# Patient Record
Sex: Female | Born: 1975 | Race: White | Hispanic: No | Marital: Married | Smoking: Former smoker
Health system: Southern US, Community
[De-identification: ages and names within clinical notes are randomized; demographics above are authoritative.]

## PROBLEM LIST (undated history)

## (undated) DIAGNOSIS — J45909 Unspecified asthma, uncomplicated: Secondary | ICD-10-CM

## (undated) DIAGNOSIS — J449 Chronic obstructive pulmonary disease, unspecified: Secondary | ICD-10-CM

## (undated) DIAGNOSIS — E039 Hypothyroidism, unspecified: Secondary | ICD-10-CM

## (undated) DIAGNOSIS — E162 Hypoglycemia, unspecified: Secondary | ICD-10-CM

## (undated) DIAGNOSIS — J189 Pneumonia, unspecified organism: Secondary | ICD-10-CM

## (undated) DIAGNOSIS — F419 Anxiety disorder, unspecified: Secondary | ICD-10-CM

## (undated) DIAGNOSIS — T7840XA Allergy, unspecified, initial encounter: Secondary | ICD-10-CM

## (undated) DIAGNOSIS — R519 Headache, unspecified: Secondary | ICD-10-CM

## (undated) HISTORY — DX: Allergy, unspecified, initial encounter: T78.40XA

## (undated) HISTORY — PX: ABDOMINAL HYSTERECTOMY: SHX81

## (undated) HISTORY — DX: Hypoglycemia, unspecified: E16.2

## (undated) HISTORY — DX: Anxiety disorder, unspecified: F41.9

---

## 2005-07-05 ENCOUNTER — Emergency Department (HOSPITAL_COMMUNITY): Admission: EM | Admit: 2005-07-05 | Discharge: 2005-07-05 | Payer: Self-pay | Admitting: Emergency Medicine

## 2007-03-05 ENCOUNTER — Ambulatory Visit (HOSPITAL_COMMUNITY): Admission: RE | Admit: 2007-03-05 | Discharge: 2007-03-05 | Payer: Self-pay | Admitting: Family Medicine

## 2015-08-01 ENCOUNTER — Emergency Department (HOSPITAL_BASED_OUTPATIENT_CLINIC_OR_DEPARTMENT_OTHER): Payer: 59

## 2015-08-01 ENCOUNTER — Emergency Department (HOSPITAL_BASED_OUTPATIENT_CLINIC_OR_DEPARTMENT_OTHER)
Admission: EM | Admit: 2015-08-01 | Discharge: 2015-08-01 | Disposition: A | Discharge status: Home / Self Care | Payer: 59 | Attending: Emergency Medicine | Admitting: Emergency Medicine

## 2015-08-01 ENCOUNTER — Encounter (HOSPITAL_BASED_OUTPATIENT_CLINIC_OR_DEPARTMENT_OTHER): Payer: Self-pay | Admitting: *Deleted

## 2015-08-01 DIAGNOSIS — F17210 Nicotine dependence, cigarettes, uncomplicated: Secondary | ICD-10-CM | POA: Diagnosis not present

## 2015-08-01 DIAGNOSIS — E119 Type 2 diabetes mellitus without complications: Secondary | ICD-10-CM

## 2015-08-01 DIAGNOSIS — R000 Tachycardia, unspecified: Secondary | ICD-10-CM | POA: Diagnosis not present

## 2015-08-01 DIAGNOSIS — R079 Chest pain, unspecified: Secondary | ICD-10-CM | POA: Diagnosis present

## 2015-08-01 LAB — CBG MONITORING, ED: Glucose-Capillary: 199 mg/dL — ABNORMAL HIGH (ref 65–99)

## 2015-08-01 LAB — BASIC METABOLIC PANEL
ANION GAP: 11 (ref 5–15)
BUN: 14 mg/dL (ref 6–20)
CALCIUM: 8.2 mg/dL — AB (ref 8.9–10.3)
CHLORIDE: 102 mmol/L (ref 101–111)
CO2: 19 mmol/L — AB (ref 22–32)
Creatinine, Ser: 0.61 mg/dL (ref 0.44–1.00)
GFR calc Af Amer: >60 mL/min (ref >60)
GFR calc non Af Amer: >60 mL/min (ref >60)
GLUCOSE: 294 mg/dL — AB (ref 65–99)
Potassium: 3.8 mmol/L (ref 3.5–5.1)
Sodium: 132 mmol/L — ABNORMAL LOW (ref 135–145)

## 2015-08-01 LAB — CBC
HCT: 47 % — ABNORMAL HIGH (ref 36.0–46.0)
Hemoglobin: 16.1 g/dL — ABNORMAL HIGH (ref 12.0–15.0)
MCH: 33.5 pg (ref 26.0–34.0)
MCHC: 34.3 g/dL (ref 30.0–36.0)
MCV: 97.7 fL (ref 78.0–100.0)
PLATELETS: 237 10*3/uL (ref 150–400)
RBC: 4.81 MIL/uL (ref 3.87–5.11)
RDW: 13 % (ref 11.5–15.5)
WBC: 13.8 10*3/uL — ABNORMAL HIGH (ref 4.0–10.5)

## 2015-08-01 LAB — D-DIMER, QUANTITATIVE (NOT AT ARMC): D DIMER QUANT: 0.34 ug{FEU}/mL (ref 0.00–0.50)

## 2015-08-01 LAB — TROPONIN I

## 2015-08-01 MED ORDER — METFORMIN HCL 500 MG PO TABS: 500.0000 mg | ORAL_TABLET | Freq: Two times a day (BID) | ORAL | Status: DC

## 2015-08-01 MED ORDER — KETOROLAC TROMETHAMINE 30 MG/ML IJ SOLN
30.0000 mg | Freq: Once | INTRAMUSCULAR | Status: AC
  Administered 2015-08-01: 30 mg via INTRAVENOUS
  Filled 2015-08-01: qty 1

## 2015-08-01 MED ORDER — SODIUM CHLORIDE 0.9 % IV BOLUS (SEPSIS)
1000.0000 mL | Freq: Once | INTRAVENOUS | Status: AC
  Administered 2015-08-01: 1000 mL via INTRAVENOUS

## 2015-08-01 NOTE — ED Notes (Signed)
Pt placed on cont cardiac monitoring, with cont POX and int NBP q56min

## 2015-08-01 NOTE — ED Provider Notes (Signed)
CSN: [id number]     Arrival date & time 08/01/15  0935 History   First MD Initiated Contact with Patient 08/01/15 [phone]     Chief Complaint  Patient presents with  . Chest Pain     (Consider location/radiation/quality/duration/timing/severity/associated sxs/prior Treatment) HPI Comments: 40 year old female chronic every day smoker with no other past medical history presents for shortness of breath and chest pain. The patient reports that over the last few days she has felt lightheaded and felt her heart racing. She is also felt shaky. She says that she has one spot in her left chest which has had sharp pain in it. She denies any cough, fevers, chills. She said she went to see her sister-in-law who is a nurse who told her her blood pressure was elevated and her heart rate and that she needed to go directly to an emergency room. Patient is not on any oral contraceptives. She has had a hysterectomy in the past. No long trips. No leg swelling.  Patient is a 40 y.o. female presenting with chest pain.  Chest Pain Associated symptoms: nausea, palpitations and shortness of breath   Associated symptoms: no abdominal pain, no back pain, no cough, no dizziness, no fatigue, no fever, no headache, not vomiting and no weakness     History reviewed. No pertinent past medical history. Past Surgical History  Procedure Laterality Date  . Abdominal hysterectomy     No family history on file. Social History  Substance Use Topics  . Smoking status: Current Every Day Smoker -- 0.50 packs/day    Types: Cigarettes  . Smokeless tobacco: None  . Alcohol Use: Yes   OB History    No data available     Review of Systems  Constitutional: Negative for fever, chills, appetite change and fatigue.  HENT: Negative for congestion, nosebleeds and postnasal drip.   Eyes: Negative for visual disturbance.  Respiratory: Positive for shortness of breath. Negative for cough and chest tightness.   Cardiovascular: Positive  for chest pain and palpitations.  Gastrointestinal: Positive for nausea. Negative for vomiting, abdominal pain, diarrhea and constipation.  Genitourinary: Negative for dysuria, urgency and hematuria.  Musculoskeletal: Negative for myalgias and back pain.  Skin: Negative for rash.  Neurological: Positive for light-headedness. Negative for dizziness, weakness and headaches.  Hematological: Does not bruise/bleed easily.      Allergies  Review of patient's allergies indicates no known allergies.  Home Medications   Prior to Admission medications   Medication Sig Start Date End Date Taking? Authorizing Provider  metFORMIN (GLUCOPHAGE) 500 MG tablet Take 1 tablet (500 mg total) by mouth 2 (two) times daily with a meal. 08/01/15   Harvel Quale, MD   BP 130/75 mmHg  Pulse 90  Resp 20  SpO2 98% Physical Exam  Constitutional: She is oriented to person, place, and time. She appears well-developed and well-nourished. No distress.  HENT:  Head: Normocephalic and atraumatic.  Right Ear: External ear normal.  Left Ear: External ear normal.  Nose: Nose normal.  Mouth/Throat: Oropharynx is clear and moist. No oropharyngeal exudate.  Eyes: EOM are normal. Pupils are equal, round, and reactive to light.  Neck: Normal range of motion. Neck supple.  Cardiovascular: Regular rhythm, normal heart sounds and intact distal pulses.  Tachycardia present.   No murmur heard. Pulmonary/Chest: Effort normal. No respiratory distress. She has no wheezes. She has no rales.  Abdominal: Soft. She exhibits no distension. There is no tenderness.  Musculoskeletal: Normal range of motion. She exhibits  no edema or tenderness.  Neurological: She is alert and oriented to person, place, and time.  Skin: Skin is warm and dry. No rash noted. She is not diaphoretic.  Vitals reviewed.   ED Course  Procedures (including critical care time) Labs Review Labs Reviewed  BASIC METABOLIC PANEL - Abnormal; Notable for the  following:    Sodium 132 (*)    CO2 19 (*)    Glucose, Bld 294 (*)    Calcium 8.2 (*)    All other components within normal limits  CBC - Abnormal; Notable for the following:    WBC 13.8 (*)    Hemoglobin 16.1 (*)    HCT 47.0 (*)    All other components within normal limits  CBG MONITORING, ED - Abnormal; Notable for the following:    Glucose-Capillary 199 (*)    All other components within normal limits  TROPONIN I  D-DIMER, QUANTITATIVE (NOT AT [facility])    Imaging Review Dg Chest 2 View  08/01/2015  CLINICAL DATA:  Chest pain and tachycardia EXAM: CHEST  2 VIEW COMPARISON:  March 05, 2007 FINDINGS: There is no edema or consolidation. The heart size and pulmonary vascularity are normal. No adenopathy. No apparent bone lesions. No pneumothorax. IMPRESSION: No edema or consolidation. Electronically Signed   By: Lowella Grip III M.D.   On: 08/01/2015 10:16   I have personally reviewed and evaluated these images and lab results as part of my medical decision-making.   EKG Interpretation   Date/Time:  Sunday Aug 01 2015 09:45:24 EDT Ventricular Rate:  107 PR Interval:  116 QRS Duration: 89 QT Interval:  343 QTC Calculation: 458 R Axis:   81 Text Interpretation:  Sinus tachycardia Atrial premature complex Right  atrial enlargement No previous ECGs available Confirmed by Donnetta Gillin  [id number]) on 08/01/2015 9:52:05 AM      MDM  Patient was seen and evaluated in stable condition. Laboratory results revealed a mildly low bicarbonate as well as hyperglycemia to 94. Normal anion gap. Patient without history of diabetes although strong family history of diabetes. Patient did feel improved after 1 L fluid bolus. All results and clinical impression were discussed at length with patient and her husband. They felt comfortable with plan for discharge and outpatient follow-up. The patient expresses the importance of establishing with a primary care physician for recheck of her chest pain as  well as her new diabetes. She was given a prescription for metformin. Strict return precautions were given. Final diagnoses:  Diabetes mellitus, new onset ([place])  Chest pain, unspecified chest pain type    1. New-onset diabetes 2. Chest pain    Harvel Quale, MD 08/01/15 1308

## 2015-08-01 NOTE — ED Notes (Signed)
MD at bedside. 

## 2015-08-01 NOTE — Discharge Instructions (Signed)
You were seen and evaluated today for your chest pain. It was noted that your blood sugar is very high. This came down with fluids. You need to stop eating and drinking things that are high in sugar.  Take the medication prescribed to help control your blood sugar. You need to follow-up with the primary care physician for reevaluation of your blood sugar as well as for reevaluation of your chest pain. The exact cause of your chest pain is not completely clear but your results today were reassuring. Return immediately for sudden worsening of symptoms.  Type 2 Diabetes Mellitus, Adult Type 2 diabetes mellitus, often simply referred to as type 2 diabetes, is a long-lasting (chronic) disease. In type 2 diabetes, the pancreas does not make enough insulin (a hormone), the cells are less responsive to the insulin that is made (insulin resistance), or both. Normally, insulin moves sugars from food into the tissue cells. The tissue cells use the sugars for energy. The lack of insulin or the lack of normal response to insulin causes excess sugars to build up in the blood instead of going into the tissue cells. As a result, high blood sugar (hyperglycemia) develops. The effect of high sugar (glucose) levels can cause many complications. Type 2 diabetes was also previously called adult-onset diabetes, but it can occur at any age.  RISK FACTORS  A person is predisposed to developing type 2 diabetes if someone in the family has the disease and also has one or more of the following primary risk factors:  Weight gain, or being overweight or obese.  An inactive lifestyle.  A history of consistently eating high-calorie foods. Maintaining a normal weight and regular physical activity can reduce the chance of developing type 2 diabetes. SYMPTOMS  A person with type 2 diabetes may not show symptoms initially. The symptoms of type 2 diabetes appear slowly. The symptoms include:  Increased thirst  (polydipsia).  Increased urination (polyuria).  Increased urination during the night (nocturia).  Sudden or unexplained weight changes.  Frequent, recurring infections.  Tiredness (fatigue).  Weakness.  Vision changes, such as blurred vision.  Fruity smell to your breath.  Abdominal pain.  Nausea or vomiting.  Cuts or bruises which are slow to heal.  Tingling or numbness in the hands or feet.  An open skin wound (ulcer). DIAGNOSIS Type 2 diabetes is frequently not diagnosed until complications of diabetes are present. Type 2 diabetes is diagnosed when symptoms or complications are present and when blood glucose levels are increased. Your blood glucose level may be checked by one or more of the following blood tests:  A fasting blood glucose test. You will not be allowed to eat for at least 8 hours before a blood sample is taken.  A random blood glucose test. Your blood glucose is checked at any time of the day regardless of when you ate.  A hemoglobin A1c blood glucose test. A hemoglobin A1c test provides information about blood glucose control over the previous 3 months.  An oral glucose tolerance test (OGTT). Your blood glucose is measured after you have not eaten (fasted) for 2 hours and then after you drink a glucose-containing beverage. TREATMENT   You may need to take insulin or diabetes medicine daily to keep blood glucose levels in the desired range.  If you use insulin, you may need to adjust the dosage depending on the carbohydrates that you eat with each meal or snack.  Lifestyle changes are recommended as part of your treatment. These  may include:  Following an individualized diet plan developed by a nutritionist or dietitian.  Exercising daily. Your health care providers will set individualized treatment goals for you based on your age, your medicines, how long you have had diabetes, and any other medical conditions you have. Generally, the goal of  treatment is to maintain the following blood glucose levels:  Before meals (preprandial): 80-130 mg/dL.  After meals (postprandial): below 180 mg/dL.  A1c: less than 6.5-7%. HOME CARE INSTRUCTIONS   Have your hemoglobin A1c level checked twice a year.  Perform daily blood glucose monitoring as directed by your health care provider.  Monitor urine ketones when you are ill and as directed by your health care provider.  Take your diabetes medicine or insulin as directed by your health care provider to maintain your blood glucose levels in the desired range.  Never run out of diabetes medicine or insulin. It is needed every day.  If you are using insulin, you may need to adjust the amount of insulin given based on your intake of carbohydrates. Carbohydrates can raise blood glucose levels but need to be included in your diet. Carbohydrates provide vitamins, minerals, and fiber which are an essential part of a healthy diet. Carbohydrates are found in fruits, vegetables, whole grains, dairy products, legumes, and foods containing added sugars.  Eat healthy foods. You should make an appointment to see a registered dietitian to help you create an eating plan that is right for you.  Lose weight if you are overweight.  Carry a medical alert card or wear your medical alert jewelry.  Carry a 15-gram carbohydrate snack with you at all times to treat low blood glucose (hypoglycemia). Some examples of 15-gram carbohydrate snacks include:  Glucose tablets, 3 or 4.  Glucose gel, 15-gram tube.  Raisins, 2 tablespoons (24 grams).  Jelly beans, 6.  Animal crackers, 8.  Regular pop, 4 ounces (120 mL).  Gummy treats, 9.  Recognize hypoglycemia. Hypoglycemia occurs with blood glucose levels of 70 mg/dL and below. The risk for hypoglycemia increases when fasting or skipping meals, during or after intense exercise, and during sleep. Hypoglycemia symptoms can include:  Tremors or  shakes.  Decreased ability to concentrate.  Sweating.  Increased heart rate.  Headache.  Dry mouth.  Hunger.  Irritability.  Anxiety.  Restless sleep.  Altered speech or coordination.  Confusion.  Treat hypoglycemia promptly. If you are alert and able to safely swallow, follow the 15:15 rule:  Take 15-20 grams of rapid-acting glucose or carbohydrate. Rapid-acting options include glucose gel, glucose tablets, or 4 ounces (120 mL) of fruit juice, regular soda, or low-fat milk.  Check your blood glucose level 15 minutes after taking the glucose.  Take 15-20 grams more of glucose if the repeat blood glucose level is still 70 mg/dL or below.  Eat a meal or snack within 1 hour once blood glucose levels return to normal.  Be alert to feeling very thirsty and urinating more frequently than usual, which are early signs of hyperglycemia. An early awareness of hyperglycemia allows for prompt treatment. Treat hyperglycemia as directed by your health care provider.  Engage in at least 150 minutes of moderate-intensity physical activity a week, spread over at least 3 days of the week or as directed by your health care provider. In addition, you should engage in resistance exercise at least 2 times a week or as directed by your health care provider. Try to spend no more than 90 minutes at one time inactive.  Adjust your medicine and food intake as needed if you start a new exercise or sport.  Follow your sick-day plan anytime you are unable to eat or drink as usual.  Do not use any tobacco products including cigarettes, chewing tobacco, or electronic cigarettes. If you need help quitting, ask your health care provider.  Limit alcohol intake to no more than 1 drink per day for nonpregnant women and 2 drinks per day for men. You should drink alcohol only when you are also eating food. Talk with your health care provider whether alcohol is safe for you. Tell your health care provider if you  drink alcohol several times a week.  Keep all follow-up visits as directed by your health care provider. This is important.  Schedule an eye exam soon after the diagnosis of type 2 diabetes and then annually.  Perform daily skin and foot care. Examine your skin and feet daily for cuts, bruises, redness, nail problems, bleeding, blisters, or sores. A foot exam by a health care provider should be done annually.  Brush your teeth and gums at least twice a day and floss at least once a day. Follow up with your dentist regularly.  Share your diabetes management plan with your workplace or school.  Keep your immunizations up to date. It is recommended that you receive a flu (influenza) vaccine every year. It is also recommended that you receive a pneumonia (pneumococcal) vaccine. If you are 45 years of age or older and have never received a pneumonia vaccine, this vaccine may be given as a series of two separate shots. Ask your health care provider which additional vaccines may be recommended.  Learn to manage stress.  Obtain ongoing diabetes education and support as needed.  Participate in or seek rehabilitation as needed to maintain or improve independence and quality of life. Request a physical or occupational therapy referral if you are having foot or hand numbness, or difficulties with grooming, dressing, eating, or physical activity. SEEK MEDICAL CARE IF:   You are unable to eat food or drink fluids for more than 6 hours.  You have nausea and vomiting for more than 6 hours.  Your blood glucose level is over 240 mg/dL.  There is a change in mental status.  You develop an additional serious illness.  You have diarrhea for more than 6 hours.  You have been sick or have had a fever for a couple of days and are not getting better.  You have pain during any physical activity.  SEEK IMMEDIATE MEDICAL CARE IF:  You have difficulty breathing.  You have moderate to large ketone  levels.   This information is not intended to replace advice given to you by your health care provider. Make sure you discuss any questions you have with your health care provider.   Document Released: 03/13/2005 Document Revised: 12/02/2014 Document Reviewed: 10/10/2011 Elsevier Interactive Patient Education 2016 [employer]  Nonspecific Chest Pain  Chest pain can be caused by many different conditions. There is always a chance that your pain could be related to something serious, such as a heart attack or a blood clot in your lungs. Chest pain can also be caused by conditions that are not life-threatening. If you have chest pain, it is very important to follow up with your health care provider. CAUSES  Chest pain can be caused by:  Heartburn.  Pneumonia or bronchitis.  Anxiety or stress.  Inflammation around your heart (pericarditis) or lung (pleuritis or pleurisy).  A  blood clot in your lung.  A collapsed lung (pneumothorax). It can develop suddenly on its own (spontaneous pneumothorax) or from trauma to the chest.  Shingles infection (varicella-zoster virus).  Heart attack.  Damage to the bones, muscles, and cartilage that make up your chest wall. This can include:  Bruised bones due to injury.  Strained muscles or cartilage due to frequent or repeated coughing or overwork.  Fracture to one or more ribs.  Sore cartilage due to inflammation (costochondritis). RISK FACTORS  Risk factors for chest pain may include:  Activities that increase your risk for trauma or injury to your chest.  Respiratory infections or conditions that cause frequent coughing.  Medical conditions or overeating that can cause heartburn.  Heart disease or family history of heart disease.  Conditions or health behaviors that increase your risk of developing a blood clot.  Having had chicken pox (varicella zoster). SIGNS AND SYMPTOMS Chest pain can feel like:  Burning or tingling on the  surface of your chest or deep in your chest.  Crushing, pressure, aching, or squeezing pain.  Dull or sharp pain that is worse when you move, cough, or take a deep breath.  Pain that is also felt in your back, neck, shoulder, or arm, or pain that spreads to any of these areas. Your chest pain may come and go, or it may stay constant. DIAGNOSIS Lab tests or other studies may be needed to find the cause of your pain. Your health care provider may have you take a test called an ambulatory ECG (electrocardiogram). An ECG records your heartbeat patterns at the time the test is performed. You may also have other tests, such as:  Transthoracic echocardiogram (TTE). During echocardiography, sound waves are used to create a picture of all of the heart structures and to look at how blood flows through your heart.  Transesophageal echocardiogram (TEE).This is a more advanced imaging test that obtains images from inside your body. It allows your health care provider to see your heart in finer detail.  Cardiac monitoring. This allows your health care provider to monitor your heart rate and rhythm in real time.  Holter monitor. This is a portable device that records your heartbeat and can help to diagnose abnormal heartbeats. It allows your health care provider to track your heart activity for several days, if needed.  Stress tests. These can be done through exercise or by taking medicine that makes your heart beat more quickly.  Blood tests.  Imaging tests. TREATMENT  Your treatment depends on what is causing your chest pain. Treatment may include:  Medicines. These may include:  Acid blockers for heartburn.  Anti-inflammatory medicine.  Pain medicine for inflammatory conditions.  Antibiotic medicine, if an infection is present.  Medicines to dissolve blood clots.  Medicines to treat coronary artery disease.  Supportive care for conditions that do not require medicines. This may  include:  Resting.  Applying heat or cold packs to injured areas.  Limiting activities until pain decreases. HOME CARE INSTRUCTIONS  If you were prescribed an antibiotic medicine, finish it all even if you start to feel better.  Avoid any activities that bring on chest pain.  Do not use any tobacco products, including cigarettes, chewing tobacco, or electronic cigarettes. If you need help quitting, ask your health care provider.  Do not drink alcohol.  Take medicines only as directed by your health care provider.  Keep all follow-up visits as directed by your health care provider. This is important. This  includes any further testing if your chest pain does not go away.  If heartburn is the cause for your chest pain, you may be told to keep your head raised (elevated) while sleeping. This reduces the chance that acid will go from your stomach into your esophagus.  Make lifestyle changes as directed by your health care provider. These may include:  Getting regular exercise. Ask your health care provider to suggest some activities that are safe for you.  Eating a heart-healthy diet. A registered dietitian can help you to learn healthy eating options.  Maintaining a healthy weight.  Managing diabetes, if necessary.  Reducing stress. SEEK MEDICAL CARE IF:  Your chest pain does not go away after treatment.  You have a rash with blisters on your chest.  You have a fever. SEEK IMMEDIATE MEDICAL CARE IF:   Your chest pain is worse.  You have an increasing cough, or you cough up blood.  You have severe abdominal pain.  You have severe weakness.  You faint.  You have chills.  You have sudden, unexplained chest discomfort.  You have sudden, unexplained discomfort in your arms, back, neck, or jaw.  You have shortness of breath at any time.  You suddenly start to sweat, or your skin gets clammy.  You feel nauseous or you vomit.  You suddenly feel light-headed or  dizzy.  Your heart begins to beat quickly, or it feels like it is skipping beats. These symptoms may represent a serious problem that is an emergency. Do not wait to see if the symptoms will go away. Get medical help right away. Call your local emergency services (911 in the [place] Do not drive yourself to the hospital.   This information is not intended to replace advice given to you by your health care provider. Make sure you discuss any questions you have with your health care provider.   Document Released: 12/21/2004 Document Revised: 04/03/2014 Document Reviewed: 10/17/2013 Elsevier Interactive Patient Education 2016 [employer].  Diabetes Mellitus and Food It is important for you to manage your blood sugar (glucose) level. Your blood glucose level can be greatly affected by what you eat. Eating healthier foods in the appropriate amounts throughout the day at about the same time each day will help you control your blood glucose level. It can also help slow or prevent worsening of your diabetes mellitus. Healthy eating may even help you improve the level of your blood pressure and reach or maintain a healthy weight.  General recommendations for healthful eating and cooking habits include:  Eating meals and snacks regularly. Avoid going long periods of time without eating to lose weight.  Eating a diet that consists mainly of plant-based foods, such as fruits, vegetables, nuts, legumes, and whole grains.  Using low-heat cooking methods, such as baking, instead of high-heat cooking methods, such as deep frying. Work with your dietitian to make sure you understand how to use the Nutrition Facts information on food labels. HOW CAN FOOD AFFECT ME? Carbohydrates Carbohydrates affect your blood glucose level more than any other type of food. Your dietitian will help you determine how many carbohydrates to eat at each meal and teach you how to count carbohydrates. Counting carbohydrates is  important to keep your blood glucose at a healthy level, especially if you are using insulin or taking certain medicines for diabetes mellitus. Alcohol Alcohol can cause sudden decreases in blood glucose (hypoglycemia), especially if you use insulin or take certain medicines for diabetes mellitus. Hypoglycemia can  be a life-threatening condition. Symptoms of hypoglycemia (sleepiness, dizziness, and disorientation) are similar to symptoms of having too much alcohol.  If your health care provider has given you approval to drink alcohol, do so in moderation and use the following guidelines:  Women should not have more than one drink per day, and men should not have more than two drinks per day. One drink is equal to:  12 oz of beer.  5 oz of wine.  1 oz of hard liquor.  Do not drink on an empty stomach.  Keep yourself hydrated. Have water, diet soda, or unsweetened iced tea.  Regular soda, juice, and other mixers might contain a lot of carbohydrates and should be counted. WHAT FOODS ARE NOT RECOMMENDED? As you make food choices, it is important to remember that all foods are not the same. Some foods have fewer nutrients per serving than other foods, even though they might have the same number of calories or carbohydrates. It is difficult to get your body what it needs when you eat foods with fewer nutrients. Examples of foods that you should avoid that are high in calories and carbohydrates but low in nutrients include:  Trans fats (most processed foods list trans fats on the Nutrition Facts label).  Regular soda.  Juice.  Candy.  Sweets, such as cake, pie, doughnuts, and cookies.  Fried foods. WHAT FOODS CAN I EAT? Eat nutrient-rich foods, which will nourish your body and keep you healthy. The food you should eat also will depend on several factors, including:  The calories you need.  The medicines you take.  Your weight.  Your blood glucose level.  Your blood pressure  level.  Your cholesterol level. You should eat a variety of foods, including:  Protein.  Lean cuts of meat.  Proteins low in saturated fats, such as fish, egg whites, and beans. Avoid processed meats.  Fruits and vegetables.  Fruits and vegetables that may help control blood glucose levels, such as apples, mangoes, and yams.  Dairy products.  Choose fat-free or low-fat dairy products, such as milk, yogurt, and cheese.  Grains, bread, pasta, and rice.  Choose whole grain products, such as multigrain bread, whole oats, and brown rice. These foods may help control blood pressure.  Fats.  Foods containing healthful fats, such as nuts, avocado, olive oil, canola oil, and fish. DOES EVERYONE WITH DIABETES MELLITUS HAVE THE SAME MEAL PLAN? Because every person with diabetes mellitus is different, there is not one meal plan that works for everyone. It is very important that you meet with a dietitian who will help you create a meal plan that is just right for you.   This information is not intended to replace advice given to you by your health care provider. Make sure you discuss any questions you have with your health care provider.   Document Released: 12/08/2004 Document Revised: 04/03/2014 Document Reviewed: 02/07/2013 Elsevier Interactive Patient Education [employer].

## 2015-08-01 NOTE — ED Notes (Signed)
Denies any SOB, N/V with chest pain

## 2015-08-01 NOTE — ED Notes (Signed)
Presents with chest pain, onset Friday PM, describes as sharp pain at left ant chest

## 2015-08-04 ENCOUNTER — Telehealth: Payer: Self-pay | Admitting: *Deleted

## 2015-08-04 NOTE — Telephone Encounter (Signed)
Unable to reach patient at time of pre-visit call. Left message for patient to return call when available. Mobile # on file is a wrong number.

## 2015-08-05 ENCOUNTER — Encounter: Payer: Self-pay | Admitting: Medical

## 2015-08-05 ENCOUNTER — Ambulatory Visit (INDEPENDENT_AMBULATORY_CARE_PROVIDER_SITE_OTHER): Payer: 59 | Admitting: Medical

## 2015-08-05 VITALS — BP 116/76 | HR 83 | Temp 98.0°F | Ht 63.0 in | Wt 128.4 lb

## 2015-08-05 DIAGNOSIS — J309 Allergic rhinitis, unspecified: Secondary | ICD-10-CM | POA: Diagnosis not present

## 2015-08-05 DIAGNOSIS — D72829 Elevated white blood cell count, unspecified: Secondary | ICD-10-CM | POA: Diagnosis not present

## 2015-08-05 DIAGNOSIS — F411 Generalized anxiety disorder: Secondary | ICD-10-CM | POA: Diagnosis not present

## 2015-08-05 DIAGNOSIS — E1310 Other specified diabetes mellitus with ketoacidosis without coma: Secondary | ICD-10-CM | POA: Diagnosis not present

## 2015-08-05 DIAGNOSIS — E1110 Type 2 diabetes mellitus with ketoacidosis without coma: Secondary | ICD-10-CM

## 2015-08-05 LAB — COMPREHENSIVE METABOLIC PANEL
ALT: 24 U/L (ref 0–35)
AST: 29 U/L (ref 0–37)
Albumin: 4.2 g/dL (ref 3.5–5.2)
Alkaline Phosphatase: 50 U/L (ref 39–117)
BUN: 5 mg/dL — ABNORMAL LOW (ref 6–23)
CALCIUM: 9.1 mg/dL (ref 8.4–10.5)
CHLORIDE: 102 meq/L (ref 96–112)
CO2: 27 meq/L (ref 19–32)
Creatinine, Ser: 0.51 mg/dL (ref 0.40–1.20)
GFR: 142.72 mL/min (ref >60.00)
Glucose, Bld: 87 mg/dL (ref 70–99)
Potassium: 4.7 mEq/L (ref 3.5–5.1)
Sodium: 138 mEq/L (ref 135–145)
Total Bilirubin: 0.5 mg/dL (ref 0.2–1.2)
Total Protein: 6.6 g/dL (ref 6.0–8.3)

## 2015-08-05 LAB — LIPID PANEL
CHOL/HDL RATIO: 3
Cholesterol: 193 mg/dL (ref 0–200)
HDL: 56.8 mg/dL (ref >39.00)
LDL CALC: 124 mg/dL — AB (ref 0–99)
NonHDL: 136.45
TRIGLYCERIDES: 63 mg/dL (ref 0.0–149.0)
VLDL: 12.6 mg/dL (ref 0.0–40.0)

## 2015-08-05 LAB — CBC WITH DIFFERENTIAL/PLATELET
BASOS PCT: 0.4 % (ref 0.0–3.0)
Basophils Absolute: 0 10*3/uL (ref 0.0–0.1)
EOS ABS: 0.2 10*3/uL (ref 0.0–0.7)
EOS PCT: 1.4 % (ref 0.0–5.0)
HEMATOCRIT: 46.1 % — AB (ref 36.0–46.0)
HEMOGLOBIN: 15.7 g/dL — AB (ref 12.0–15.0)
LYMPHS PCT: 22.7 % (ref 12.0–46.0)
Lymphs Abs: 2.5 10*3/uL (ref 0.7–4.0)
MCHC: 34 g/dL (ref 30.0–36.0)
MCV: 97.4 fl (ref 78.0–100.0)
Monocytes Absolute: 0.9 10*3/uL (ref 0.1–1.0)
Monocytes Relative: 8.6 % (ref 3.0–12.0)
Neutro Abs: 7.3 10*3/uL (ref 1.4–7.7)
Neutrophils Relative %: 66.9 % (ref 43.0–77.0)
Platelets: 224 10*3/uL (ref 150.0–400.0)
RBC: 4.74 Mil/uL (ref 3.87–5.11)
RDW: 12.7 % (ref 11.5–15.5)
WBC: 10.9 10*3/uL — AB (ref 4.0–10.5)

## 2015-08-05 LAB — HEMOGLOBIN A1C: Hgb A1c MFr Bld: 5.4 % (ref 4.6–6.5)

## 2015-08-05 MED ORDER — METFORMIN HCL 500 MG PO TABS: 500.0000 mg | ORAL_TABLET | Freq: Every day | ORAL | Status: DC

## 2015-08-05 MED ORDER — SERTRALINE HCL 25 MG PO TABS: 25.0000 mg | ORAL_TABLET | Freq: Every day | ORAL | Status: DC

## 2015-08-05 NOTE — Addendum Note (Signed)
Addended by: Tasia Catchings on: 08/05/2015 04:10 PM   Modules accepted: Orders, Medications

## 2015-08-05 NOTE — Progress Notes (Signed)
Subjective:    Patient ID: [name], female    DOB: November 20, 1975, 40 y.o.   MRN: [id number]  HPI   I have reviewed pt PMH, PSH, FH, Social History and Surgical History.  Pt works as Fish farm manager [employer], Pt walks a lot at work, no caffeinated beverage, pt diet improved over last year, smoke 1/2 pack a day, married- 3 children  Pt states just recently she was diagnosed with diabetes. She went ED just recently. She felt shaky all weekend and fatigued. Pt went to ED and had work up. Her bs 294. Pt states other symptoms at time of ED evaluation no longer present. See ED ros. Pt other labs such as troponin and D-dimer were negative.   Pt taking metformin. She has some loose stools. So she cut back to one tablet a day.  On review no infection symptoms.    Pt has allergies in spring in fall. Mild and states when takes med makes her sleepy.  Anxiety- mild low level daily. Seemed to come on with various family members that passed last 2-3 years. No depression. Interrupted sleep. Wakes about every 2 hours. Pt does not think she snores.    Review of Systems  Constitutional: Negative for fever, chills, diaphoresis, activity change and fatigue.  Respiratory: Negative for cough, chest tightness and shortness of breath.   Cardiovascular: Negative for chest pain, palpitations and leg swelling.  Gastrointestinal: Negative for nausea, vomiting and abdominal pain.  Endocrine: Positive for polydipsia. Negative for polyphagia and polyuria.  Genitourinary: Negative for dysuria and frequency.  Musculoskeletal: Negative for back pain, neck pain and neck stiffness.  Skin: Negative for rash.  Neurological: Negative for dizziness, weakness and headaches.  Psychiatric/Behavioral: Positive for sleep disturbance. Negative for suicidal ideas, behavioral problems, confusion and agitation. The patient is nervous/anxious.     History reviewed. No pertinent past medical history.   Social History    Social History  . Marital Status: Single    Spouse Name: N/A  . Number of Children: N/A  . Years of Education: N/A   Occupational History  . Not on file.   Social History Main Topics  . Smoking status: Current Every Day Smoker -- 0.50 packs/day    Types: Cigarettes  . Smokeless tobacco: Not on file  . Alcohol Use: Yes  . Drug Use: No  . Sexual Activity: Not on file   Other Topics Concern  . Not on file   Social History Narrative    Past Surgical History  Procedure Laterality Date  . Abdominal hysterectomy      History reviewed. No pertinent family history.  No Known Allergies  Current Outpatient Prescriptions on File Prior to Visit  Medication Sig Dispense Refill  . metFORMIN (GLUCOPHAGE) 500 MG tablet Take 1 tablet (500 mg total) by mouth 2 (two) times daily with a meal. 14 tablet 0   No current facility-administered medications on file prior to visit.    BP 116/76 mmHg  Pulse 83  Temp(Src) 98 F (36.7 C) (Oral)  Ht 5\' 3"  (1.6 m)  Wt 128 lb 6.4 oz (58.242 kg)  BMI 22.75 kg/m2  SpO2 99%       Objective:   Physical Exam  General Mental Status- Alert. General Appearance- Not in acute distress.   Skin General: Color- Normal Color. Moisture- Normal Moisture.  Neck Carotid Arteries- Normal color. Moisture- Normal Moisture. No carotid bruits. No JVD.  Chest and Lung Exam Auscultation: Breath Sounds:-Normal.  Cardiovascular Auscultation:Rythm- Regular. Murmurs &  Other Heart Sounds:Auscultation of the heart reveals- No Murmurs.  Abdomen Inspection:-Inspeection Normal. Palpation/Percussion:Note:No mass. Palpation and Percussion of the abdomen reveal- Non Tender, Non Distended + BS, no rebound or guarding.  Neurologic Cranial Nerve exam:- CN III-XII intact(No nystagmus), symmetric smile. Strength:- 5/5 equal and symmetric strength both upper and lower extremities.      Assessment & Plan:  For your diabetes you can take metformin 1 tab a day.  Will get a1-c. May add other type med after labs. And may in near future advise increase metformin to twice daily. Provided you adapt to medication side effect/can tolerate. Also you could call you insurance and ask them what glucometer they prefer. Then call us with the name.  For allergies in future when they flare in future could try xyzal.  For daily low level anxiety will rx low dose sertraline.   Will get labs today cbc, cmp, lipid panel and a1c.   Follow up date to be determined.   Also please try to stop smoking and cut back.   Luellen Howson, Percell Miller, PA-C

## 2015-08-05 NOTE — Patient Instructions (Addendum)
For your diabetes you can take metformin 1 tab a day. Will get a1-c. May add other type med after labs. And may in near future advise increase metformin to twice daily. Provided you adapt to medication side effect/can tolerate. Also you could call you insurance and ask them what glucometer they prefer. Then call us with the name.  For allergies in future when they flare in future could try xyzal.  For daily low level anxiety will rx low dose sertraline.   Will get labs today cbc, cmp, lipid panel and a1c.   Follow up date to be determined.   Also please try to stop smoking and cut back.

## 2015-08-05 NOTE — Progress Notes (Signed)
Pre visit review using our clinic review tool, if applicable. No additional management support is needed unless otherwise documented below in the visit note. 

## 2015-10-22 ENCOUNTER — Ambulatory Visit (INDEPENDENT_AMBULATORY_CARE_PROVIDER_SITE_OTHER): Payer: 59 | Admitting: Family Medicine

## 2015-10-22 ENCOUNTER — Encounter: Payer: Self-pay | Admitting: Family Medicine

## 2015-10-22 VITALS — BP 121/77 | HR 76 | Temp 98.3°F | Resp 20 | Ht 63.0 in | Wt 125.2 lb

## 2015-10-22 DIAGNOSIS — F418 Other specified anxiety disorders: Secondary | ICD-10-CM

## 2015-10-22 DIAGNOSIS — Z23 Encounter for immunization: Secondary | ICD-10-CM | POA: Diagnosis not present

## 2015-10-22 DIAGNOSIS — E119 Type 2 diabetes mellitus without complications: Secondary | ICD-10-CM | POA: Diagnosis not present

## 2015-10-22 MED ORDER — ESCITALOPRAM OXALATE 10 MG PO TABS: 10.0000 mg | ORAL_TABLET | Freq: Every day | ORAL | 0 refills | Status: DC

## 2015-10-22 NOTE — Patient Instructions (Addendum)
We have completed a urine to day to check kidneys bc of your diabetes diagnoses. We also started the pneumonia vaccination series for your smoking history and diabetes. You will receive the 2nd half of this series in 1 year.   We will start lexapro 10 mg daily. Stop zoloft.  Follow up week of 11/15/2015.

## 2015-10-22 NOTE — Progress Notes (Signed)
Patient ID: Laura Steele, female  DOB: May 28, 1975, 40 y.o.   MRN: [id number] Patient Care Team    Relationship Specialty Notifications Start End  Ma Hillock, DO PCP - General Family Medicine  10/22/15     Subjective:  Laura Steele is a 40 y.o.  female present for new patient establishment. All past medical history, surgical history, allergies, family history, immunizations, medications and social history were updated in the electronic medical record today. All recent labs, ED visits and hospitalizations within the last year were reviewed.  Type 2 diabetes mellitus without complication, without long-term current use of insulin ([place]): Confusing history surrounding diagnosis. Pt was seen inED on 08/04/2015 with acute illness. She was found to be hyperglycemic to 294 and started on metformin. She followed the following day as a new pt to a PCP and a1c was 5.4. She was having diarrhea from the metformin and dose was reduced to 500 mg QD. She is now tolerating metformin.Marland Kitchen Her fasting BG is 90-100.   Depression with anxiety Patient was seen with new provider on Aug 05, 2015 and complained of anxiety. She also endorsed difficulty sleeping. She was started on zoloft 25 mg. Today she states the medication is not working for her. She feels that it is making her gain weight and feel bloated. Her anxiety is not well controlled, she is having panic attacks. She is states the triggers she is aware of have been multiple deaths in the family last 3 years and her job.  She has been on medications prior, but she forgets the names of those.    Negative Mood disorder screen  Depression screen [facility] 2/9 10/22/2015  Decreased Interest 0  Down, Depressed, Hopeless 2  PHQ - 2 Score 2  Altered sleeping 3  Tired, decreased energy 2  Change in appetite 3  Feeling bad or failure about yourself  1  Trouble concentrating 0  Moving slowly or fidgety/restless 2  Suicidal thoughts 0  PHQ-9 Score 13   GAD  7 : Generalized Anxiety Score 10/22/2015  Nervous, Anxious, on Edge 2  Control/stop worrying 1  Worry too much - different things 1  Trouble relaxing 2  Restless 1  Easily annoyed or irritable 2  Afraid - awful might happen 1  Total GAD 7 Score 10  Anxiety Difficulty Very difficult    Immunization History  Administered Date(s) Administered  . Pneumococcal Conjugate-13 10/22/2015     Past Medical History:  Diagnosis Date  . Allergy   . Anxiety   . Diabetes mellitus without complication ([place])    No Known Allergies Past Surgical History:  Procedure Laterality Date  . ABDOMINAL HYSTERECTOMY     Family History  Problem Relation Age of Onset  . Diabetes Mother    Social History   Social History  . Marital status: Single    Spouse name: N/A  . Number of children: N/A  . Years of education: N/A   Occupational History  . Not on file.   Social History Main Topics  . Smoking status: Current Every Day Smoker    Packs/day: 0.50    Types: Cigarettes  . Smokeless tobacco: Never Used  . Alcohol use Yes     Comment: Pt states was drinkning 40 0z of beer a night. but stopped since sunday.  . Drug use: No  . Sexual activity: Yes   Other Topics Concern  . Not on file   Social History Narrative  . No narrative on  file     Medication List       Accurate as of 10/22/15  3:59 PM. Always use your most recent med list.          escitalopram 10 MG tablet Commonly known as:  LEXAPRO Take 1 tablet (10 mg total) by mouth daily.   metFORMIN 500 MG tablet Commonly known as:  GLUCOPHAGE Take 1 tablet (500 mg total) by mouth daily with breakfast.        Recent Results (from the past 2160 hour(s))  Basic metabolic panel     Status: Abnormal   Collection Time: 08/01/15 10:03 AM  Result Value Ref Range   Sodium 132 (L) 135 - 145 mmol/L   Potassium 3.8 3.5 - 5.1 mmol/L   Chloride 102 101 - 111 mmol/L   CO2 19 (L) 22 - 32 mmol/L   Glucose, Bld 294 (H) 65 - 99 mg/dL    BUN 14 6 - 20 mg/dL   Creatinine, Ser 0.61 0.44 - 1.00 mg/dL   Calcium 8.2 (L) 8.9 - 10.3 mg/dL   GFR calc non Af Amer >60 >60 mL/min   GFR calc Af Amer >60 >60 mL/min    Comment: (NOTE) The eGFR has been calculated using the CKD EPI equation. This calculation has not been validated in all clinical situations. eGFR's persistently <60 mL/min signify possible Chronic Kidney Disease.    Anion gap 11 5 - 15  CBC     Status: Abnormal   Collection Time: 08/01/15 10:03 AM  Result Value Ref Range   WBC 13.8 (H) 4.0 - 10.5 K/uL   RBC 4.81 3.87 - 5.11 MIL/uL   Hemoglobin 16.1 (H) 12.0 - 15.0 g/dL   HCT 47.0 (H) 36.0 - 46.0 %   MCV 97.7 78.0 - 100.0 fL   MCH 33.5 26.0 - 34.0 pg   MCHC 34.3 30.0 - 36.0 g/dL   RDW 13.0 11.5 - 15.5 %   Platelets 237 150 - 400 K/uL  Troponin I     Status: None   Collection Time: 08/01/15 10:03 AM  Result Value Ref Range   Troponin I <0.03 <0.031 ng/mL    Comment:        NO INDICATION OF MYOCARDIAL INJURY.   D-dimer, quantitative (not at [facility])     Status: None   Collection Time: 08/01/15 10:03 AM  Result Value Ref Range   D-Dimer, Quant 0.34 0.00 - 0.50 ug/mL-FEU    Comment: (NOTE) At the manufacturer cut-off of 0.50 ug/mL FEU, this assay has been documented to exclude PE with a sensitivity and negative predictive value of 97 to 99%.  At this time, this assay has not been approved by the FDA to exclude DVT/VTE. Results should be correlated with clinical presentation.   POC CBG, ED     Status: Abnormal   Collection Time: 08/01/15 12:13 PM  Result Value Ref Range   Glucose-Capillary 199 (H) 65 - 99 mg/dL  Comprehensive metabolic panel     Status: Abnormal   Collection Time: 08/05/15  9:15 AM  Result Value Ref Range   Sodium 138 135 - 145 mEq/L   Potassium 4.7 3.5 - 5.1 mEq/L   Chloride 102 96 - 112 mEq/L   CO2 27 19 - 32 mEq/L   Glucose, Bld 87 70 - 99 mg/dL   BUN 5 (L) 6 - 23 mg/dL   Creatinine, Ser 0.51 0.40 - 1.20 mg/dL   Total Bilirubin 0.5  0.2 - 1.2 mg/dL   Alkaline  Phosphatase 50 39 - 117 U/L   AST 29 0 - 37 U/L   ALT 24 0 - 35 U/L   Total Protein 6.6 6.0 - 8.3 g/dL   Albumin 4.2 3.5 - 5.2 g/dL   Calcium 9.1 8.4 - 10.5 mg/dL   GFR 142.72 >60.00 mL/min  Hemoglobin A1c     Status: None   Collection Time: 08/05/15  9:15 AM  Result Value Ref Range   Hgb A1c MFr Bld 5.4 4.6 - 6.5 %    Comment: Glycemic Control Guidelines for People with Diabetes:Non Diabetic:  <6%Goal of Therapy: <7%Additional Action Suggested:  >8%   CBC w/Diff     Status: Abnormal   Collection Time: 08/05/15  9:15 AM  Result Value Ref Range   WBC 10.9 (H) 4.0 - 10.5 K/uL   RBC 4.74 3.87 - 5.11 Mil/uL   Hemoglobin 15.7 (H) 12.0 - 15.0 g/dL   HCT 46.1 (H) 36.0 - 46.0 %   MCV 97.4 78.0 - 100.0 fl   MCHC 34.0 30.0 - 36.0 g/dL   RDW 12.7 11.5 - 15.5 %   Platelets 224.0 150.0 - 400.0 K/uL   Neutrophils Relative % 66.9 43.0 - 77.0 %   Lymphocytes Relative 22.7 12.0 - 46.0 %   Monocytes Relative 8.6 3.0 - 12.0 %   Eosinophils Relative 1.4 0.0 - 5.0 %   Basophils Relative 0.4 0.0 - 3.0 %   Neutro Abs 7.3 1.4 - 7.7 K/uL   Lymphs Abs 2.5 0.7 - 4.0 K/uL   Monocytes Absolute 0.9 0.1 - 1.0 K/uL   Eosinophils Absolute 0.2 0.0 - 0.7 K/uL   Basophils Absolute 0.0 0.0 - 0.1 K/uL  Lipid panel     Status: Abnormal   Collection Time: 08/05/15  9:15 AM  Result Value Ref Range   Cholesterol 193 0 - 200 mg/dL    Comment: ATP III Classification       Desirable:  < 200 mg/dL               Borderline High:  200 - 239 mg/dL          High:  > = 240 mg/dL   Triglycerides 63.0 0.0 - 149.0 mg/dL    Comment: Normal:  <150 mg/dLBorderline High:  150 - 199 mg/dL   HDL 56.80 >39.00 mg/dL   VLDL 12.6 0.0 - 40.0 mg/dL   LDL Cholesterol 124 (H) 0 - 99 mg/dL   Total CHOL/HDL Ratio 3     Comment:                Men          Women1/2 Average Risk     3.4          3.3Average Risk          5.0          4.42X Average Risk          9.6          7.13X Average Risk          15.0           11.0                       NonHDL 136.45     Comment: NOTE:  Non-HDL goal should be 30 mg/dL higher than patient's LDL goal (i.e. LDL goal of < 70 mg/dL, would have non-HDL goal of < 100 mg/dL)    Dg Chest  2 View  Result Date: 08/01/2015 CLINICAL DATA:  Chest pain and tachycardia EXAM: CHEST  2 VIEW COMPARISON:  March 05, 2007 FINDINGS: There is no edema or consolidation. The heart size and pulmonary vascularity are normal. No adenopathy. No apparent bone lesions. No pneumothorax. IMPRESSION: No edema or consolidation. Electronically Signed   By: Lowella Grip III M.D.   On: 08/01/2015 10:16     ROS: 14 pt review of systems performed and negative (unless mentioned in an HPI)  Objective: BP 121/77 (BP Location: Left Arm, Patient Position: Sitting, Cuff Size: Normal)   Pulse 76   Temp 98.3 F (36.8 C) (Oral)   Resp 20   Ht 5' 3"  (1.6 m)   Wt 125 lb 4 oz (56.8 kg)   SpO2 100%   BMI 22.19 kg/m  Gen: Afebrile. No acute distress. Nontoxic in appearance, well-developed, well-nourished,  Thin female.  HENT: AT. [place]. MMM, no oral lesions Eyes:Pupils Equal Round Reactive to light, Extraocular movements intact,  Conjunctiva without redness, discharge or icterus. Neck/lymp/endocrine: Supple,no lymphadenopathy, no thyromegaly CV: RRR, no edema, +2/4 P posterior tibialis pulses.  Chest: CTAB, no wheeze, rhonchi or crackles. Diminished lung sounds. Abd: Soft. NTND. BS present. Skin: No rashes, purpura or petechiae. Warm and well-perfused. Skin intact. Neuro/Msk: Normal gait. PERLA. EOMi. Alert. Oriented x3.   Psych: Normal affect, dress and demeanor. Normal speech. Normal thought content and judgment.   Assessment/plan: Fallen Crisostomo is a 40 y.o. female present for establishment of care with new provider (within [place]) with complaints.  1. Type 2 diabetes mellitus without complication, without long-term current use of insulin ([facility]) - continue metformin 500 mg daily. Continue monitoring  fastin BG.  - uncertain if she is a diabetic, possible lab error ? Considering a1c is 5.4 the day after elevated sugar in ED. Too early to retest a1c today. Pt will follow up after 8/11 to have a1c completed - Foot exam will be completed on next visit.  - Prevnar administered today. PSV23 09/2016 - will discuss diabetic eye exam on DM visit in 3 weeks.  - consider nutrition referral  - Urine Microalbumin w/creat. ratio - Pneumococcal conjugate vaccine 13-valent  2. Depression with anxiety - PHQ, GAD7 and mood disorder screening completed today. >20 m face to face was completed for this problem alone.  - DC zoloft, start lexarpo 10 mg. - f/u 4 weeks, will either refill or increase dose and refill.   Greater than 40 minutes spent with patient, >50% of time spent face to face counseling patient and coordinating care.    Return in about 3 weeks (around 11/15/2015), or Diabetes with a1c before rooming.  Electronically signed by: Howard Pouch, DO [place]

## 2015-10-25 ENCOUNTER — Telehealth: Payer: Self-pay | Admitting: Family Medicine

## 2015-10-25 LAB — MICROALBUMIN / CREATININE URINE RATIO
CREATININE, URINE: 59 mg/dL (ref 20–320)
MICROALB UR: 0.2 mg/dL
MICROALB/CREAT RATIO: 3 ug/mg{creat} (ref <30)

## 2015-10-25 NOTE — Telephone Encounter (Signed)
pts kidney/urine test is normal.

## 2015-10-26 NOTE — Telephone Encounter (Signed)
Spoke with patient reviewed lab results. 

## 2015-11-03 ENCOUNTER — Encounter: Payer: Self-pay | Admitting: Family Medicine

## 2015-11-03 ENCOUNTER — Ambulatory Visit (INDEPENDENT_AMBULATORY_CARE_PROVIDER_SITE_OTHER): Payer: 59 | Admitting: Family Medicine

## 2015-11-03 ENCOUNTER — Telehealth: Payer: Self-pay | Admitting: Family Medicine

## 2015-11-03 VITALS — BP 104/75 | HR 79 | Temp 98.4°F | Resp 20 | Wt 121.8 lb

## 2015-11-03 DIAGNOSIS — E162 Hypoglycemia, unspecified: Secondary | ICD-10-CM

## 2015-11-03 DIAGNOSIS — E119 Type 2 diabetes mellitus without complications: Secondary | ICD-10-CM

## 2015-11-03 LAB — GLUCOSE, POCT (MANUAL RESULT ENTRY): POC GLUCOSE: 98 mg/dL (ref 70–99)

## 2015-11-03 MED ORDER — GLUCOSE 4 G PO CHEW: 1.0000 | CHEWABLE_TABLET | Freq: Once | ORAL | 0 refills | Status: DC | PRN

## 2015-11-03 NOTE — Telephone Encounter (Signed)
Patient has scheduled appt today.

## 2015-11-03 NOTE — Telephone Encounter (Signed)
[place] Day - Client [facility]  Patient Name: Laura Steele  DOB: [birth year]    Initial Comment Caller States she is shaky, lightheaded and dizzy   Nurse Assessment  Nurse: Wayne Sever, RN, Tillie Rung Date/Time (Eastern Time): 11/03/2015 9:03:24 AM  Confirm and document reason for call. If symptomatic, describe symptoms. You must click the next button to save text entered. ---Caller states she has been diagnosed with Diabetes approx. 3 months ago. She states she is shaky, lightheaded and dizzy. She states her blood sugar is 68 and she is feeling off this morning. She states her blood sugar was 38 yesterday. She states she was also having diarrhea yesterday.  Has the patient traveled out of the country within the last 30 days? ---Not Applicable  Does the patient have any new or worsening symptoms? ---Yes  Will a triage be completed? ---Yes  Related visit to physician within the last 2 weeks? ---No  Does the PT have any chronic conditions? (i.e. diabetes, asthma, etc.) ---Yes  List chronic conditions. ---Diabetic Type 2  Is the patient pregnant or possibly pregnant? (Ask all females between the ages of 24-55) ---No  Is this a behavioral health or substance abuse call? ---No     Guidelines    Guideline Title Affirmed Question Affirmed Notes  Diabetes - Low Blood Sugar [1] Blood glucose < 70 mg/dl (3.9 mmol/l) or symptomatic with other adult present AND [2] cause unknown food, strenuous exercise.)    Final Disposition User   Call PCP within 24 Hours Wayne Sever, RN, Tillie Rung    Comments  Scheduled today at 400pm primary MD   Referrals  REFERRED TO PCP OFFICE   Disagree/Comply: Comply

## 2015-11-03 NOTE — Progress Notes (Signed)
Patient ID: Laura Steele, female  DOB: 10-12-1975, 40 y.o.   MRN: [id number] Patient Care Team    Relationship Specialty Notifications Start End  Ma Hillock, DO PCP - General Family Medicine  10/22/15     Subjective:  Laura Steele is a 40 y.o.  female present for Hypoglycemia.  Patient presents for an acute office visit with complaints of hypoglycemia 2. She reports last week she had a low blood sugar of 34 and this morning she had a low blood sugar of 58. On both of these occasions she felt shaky. The shakiness was resolved by drinking orange juice or eating sugary snack. Patient's only diabetic medication is Glucophage 500 mg daily. Patient states she did have one episode of diarrhea as well. She was diagnosed as a type II diabetic in the emergency room with one elevated blood sugar, and then started on high-dose metformin. Her A1c in a follow-up 2 days later with not that of even a prediabetic. She states the highest fasting blood sugars she seen have been around 110, usually in the 80s to 90s. She states on both occasions she was having hypoglycemic symptoms she had eaten just a few hours prior. She denies current symptoms, and her point-of-care glucose is normal today. She states she had been feeling really tired as well, otherwise she has been asymptomatic. She denies any chest pain, shortness of breath, dizziness, diaphoresis, nonhealing wounds, numbness or tingling in any of her extremities. She denies syncope.   Past Medical History:  Diagnosis Date  . Allergy   . Anxiety   . Diabetes mellitus without complication ([place])    No Known Allergies Past Surgical History:  Procedure Laterality Date  . ABDOMINAL HYSTERECTOMY     Family History  Problem Relation Age of Onset  . Diabetes Mother    Social History   Social History  . Marital status: Single    Spouse name: N/A  . Number of children: N/A  . Years of education: N/A   Occupational History  . Not on file.    Social History Main Topics  . Smoking status: Current Every Day Smoker    Packs/day: 0.50    Types: Cigarettes  . Smokeless tobacco: Never Used  . Alcohol use Yes     Comment: Pt states was drinkning 40 0z of beer a night. but stopped since sunday.  . Drug use: No  . Sexual activity: Yes   Other Topics Concern  . Not on file   Social History Narrative  . No narrative on file     Medication List       Accurate as of 11/03/15  4:38 PM. Always use your most recent med list.          escitalopram 10 MG tablet Commonly known as:  LEXAPRO Take 1 tablet (10 mg total) by mouth daily.   metFORMIN 500 MG tablet Commonly known as:  GLUCOPHAGE Take 1 tablet (500 mg total) by mouth daily with breakfast.        Recent Results (from the past 2160 hour(s))  Urine Microalbumin w/creat. ratio     Status: None   Collection Time: 10/22/15  4:09 PM  Result Value Ref Range   Creatinine, Urine 59 20 - 320 mg/dL   Microalb, Ur 0.2 Not estab mg/dL   Microalb Creat Ratio 3 <30 mcg/mg creat    Comment: The ADA has defined abnormalities in albumin excretion as follows:  Category           Result                            (mcg/mg creatinine)                 Normal:    <30       Microalbuminuria:    30 - 299   Clinical albuminuria:    > or = 300   The ADA recommends that at least two of three specimens collected within a 3 - 6 month period be abnormal before considering a patient to be within a diagnostic category.     POCT glucose (manual entry)     Status: Normal   Collection Time: 11/03/15  4:36 PM  Result Value Ref Range   POC Glucose 98 70 - 99 mg/dl    Dg Chest 2 View  Result Date: 08/01/2015 CLINICAL DATA:  Chest pain and tachycardia EXAM: CHEST  2 VIEW COMPARISON:  March 05, 2007 FINDINGS: There is no edema or consolidation. The heart size and pulmonary vascularity are normal. No adenopathy. No apparent bone lesions. No pneumothorax. IMPRESSION: No edema or  consolidation. Electronically Signed   By: Lowella Grip III M.D.   On: 08/01/2015 10:16     ROS: 14 pt review of systems performed and negative (unless mentioned in an HPI)  Objective: BP 104/75 (BP Location: Right Arm, Patient Position: Sitting, Cuff Size: Normal)   Pulse 79   Temp 98.4 F (36.9 C) (Oral)   Resp 20   Wt 121 lb 12 oz (55.2 kg)   SpO2 98%   BMI 21.57 kg/m  Gen: Afebrile. No acute distress. Nontoxic in appearance, well-developed, well-nourished,  Thin female.  HENT: AT. [place]. MMM, no oral lesions Eyes:Pupils Equal Round Reactive to light, Extraocular movements intact,  Conjunctiva without redness, discharge or icterus. Neck/lymp/endocrine: Supple,no lymphadenopathy, no thyromegaly CV: RRR, no edema Chest: CTAB, no wheeze, rhonchi or crackles.  Abd: Soft. NTND. BS present. Skin:  Warm and well-perfused. Skin intact. Neuro/Msk: Normal gait. PERLA. EOMi. Alert. Oriented x3.   Psych: Normal affect, dress and demeanor. Normal speech. Normal thought content and judgment.   Assessment/plan: Laura Steele is a 40 y.o. female present for Hypoglycemia  Hypoglycemia: Discussed with patient metformin should not cause her to have hypoglycemia. She was only taking 500 mg daily, considering her fasting blood sugars, her last A1c am going to go ahead and discontinue the metformin today. Patient will have A1c collected this week. - She is to continue taking her fasting blood sugars in the morning and write these down. - She was given glucose chewable tablet prescription in the event that she would be hypoglycemic and symptomatic. Patient was encouraged to call 911 if she experiences any severe hypoglycemia symptoms. AVS on hypoglycemia was provided to patient today. - Future collect labs: Insulin, insulin antibody, C-peptide, beta hydroxybutyrate, A1c and glucose - Follow-up in one week:   > 25 minutes spent with patient, >50% of time spent face to face counseling patient and  coordinating care.   Electronically signed by: Howard Pouch, DO [place]

## 2015-11-03 NOTE — Patient Instructions (Addendum)
It was a pleasure to see you. Keep hard candy around and orange juice etc.  I have also called in glucose tabs for you if you are having symptoms. And level low.  Record all fasting glucose in the morning or if having symptoms.  They will call you tomorrow to set up a lab appt for on 11/08/2015.    Call 911 if confusion or severe hypoglycemic symptoms.    Hypoglycemia Hypoglycemia occurs when the glucose in your blood is too low. Glucose is a type of sugar that is your body's main energy source. Hormones, such as insulin and glucagon, control the level of glucose in the blood. Insulin lowers blood glucose and glucagon increases blood glucose. Having too much insulin in your blood stream, or not eating enough food containing sugar, can result in hypoglycemia. Hypoglycemia can happen to people with or without diabetes. It can develop quickly and can be a medical emergency.  CAUSES   Missing or delaying meals.  Not eating enough carbohydrates at meals.  Taking too much diabetes medicine.  Not timing your oral diabetes medicine or insulin doses with meals, snacks, and exercise.  Nausea and vomiting.  Certain medicines.  Severe illnesses, such as hepatitis, kidney disorders, and certain eating disorders.  Increased activity or exercise without eating something extra or adjusting medicines.  Drinking too much alcohol.  A nerve disorder that affects body functions like your heart rate, blood pressure, and digestion (autonomic neuropathy).  A condition where the stomach muscles do not function properly (gastroparesis). Therefore, medicines and food may not absorb properly.  Rarely, a tumor of the pancreas can produce too much insulin. SYMPTOMS   Hunger.  Sweating (diaphoresis).  Change in body temperature.  Shakiness.  Headache.  Anxiety.  Lightheadedness.  Irritability.  Difficulty concentrating.  Dry mouth.  Tingling or numbness in the hands or feet.  Restless  sleep or sleep disturbances.  Altered speech and coordination.  Change in mental status.  Seizures or prolonged convulsions.  Combativeness.  Drowsiness (lethargic).  Weakness.  Increased heart rate or palpitations.  Confusion.  Pale, gray skin color.  Blurred or double vision.  Fainting. DIAGNOSIS  A physical exam and medical history will be performed. Your caregiver may make a diagnosis based on your symptoms. Blood tests and other lab tests may be performed to confirm a diagnosis. Once the diagnosis is made, your caregiver will see if your signs and symptoms go away once your blood glucose is raised.  TREATMENT  Usually, you can easily treat your hypoglycemia when you notice symptoms.  Check your blood glucose. If it is less than 70 mg/dl, take one of the following:   3-4 glucose tablets.    cup juice.    cup regular soda.   1 cup skim milk.   -1 tube of glucose gel.   5-6 hard candies.   Avoid high-fat drinks or food that may delay a rise in blood glucose levels.  Do not take more than the recommended amount of sugary foods, drinks, gel, or tablets. Doing so will cause your blood glucose to go too high.   Wait 10-15 minutes and recheck your blood glucose. If it is still less than 70 mg/dl or below your target range, repeat treatment.   Eat a snack if it is more than 1 hour until your next meal.  There may be a time when your blood glucose may go so low that you are unable to treat yourself at home when you start to  notice symptoms. You may need someone to help you. You may even faint or be unable to swallow. If you cannot treat yourself, someone will need to bring you to the hospital.  [place]  If you have diabetes, follow your diabetes management plan by:  Taking your medicines as directed.  Following your exercise plan.  Following your meal plan. Do not skip meals. Eat on time.  Testing your blood glucose regularly. Check  your blood glucose before and after exercise. If you exercise longer or different than usual, be sure to check blood glucose more frequently.  Wearing your medical alert jewelry that says you have diabetes.  Identify the cause of your hypoglycemia. Then, develop ways to prevent the recurrence of hypoglycemia.  Do not take a hot bath or shower right after an insulin shot.  Always carry treatment with you. Glucose tablets are the easiest to carry.  If you are going to drink alcohol, drink it only with meals.  Tell friends or family members ways to keep you safe during a seizure. This may include removing hard or sharp objects from the area or turning you on your side.  Maintain a healthy weight. SEEK MEDICAL CARE IF:   You are having problems keeping your blood glucose in your target range.  You are having frequent episodes of hypoglycemia.  You feel you might be having side effects from your medicines.  You are not sure why your blood glucose is dropping so low.  You notice a change in vision or a new problem with your vision. SEEK IMMEDIATE MEDICAL CARE IF:   Confusion develops.  A change in mental status occurs.  The inability to swallow develops.  Fainting occurs.   This information is not intended to replace advice given to you by your health care provider. Make sure you discuss any questions you have with your health care provider.   Document Released: 03/13/2005 Document Revised: 03/18/2013 Document Reviewed: 11/17/2014 Elsevier Interactive Patient Education [employer].

## 2015-11-08 ENCOUNTER — Other Ambulatory Visit: Payer: 59

## 2015-11-12 ENCOUNTER — Other Ambulatory Visit (INDEPENDENT_AMBULATORY_CARE_PROVIDER_SITE_OTHER): Payer: 59

## 2015-11-12 DIAGNOSIS — E119 Type 2 diabetes mellitus without complications: Secondary | ICD-10-CM | POA: Diagnosis not present

## 2015-11-12 DIAGNOSIS — E162 Hypoglycemia, unspecified: Secondary | ICD-10-CM

## 2015-11-12 LAB — GLUCOSE, RANDOM: GLUCOSE: 82 mg/dL (ref 65–99)

## 2015-11-13 LAB — HEMOGLOBIN A1C
HEMOGLOBIN A1C: 4.8 % (ref <5.7)
Mean Plasma Glucose: 91 mg/dL

## 2015-11-15 ENCOUNTER — Ambulatory Visit (INDEPENDENT_AMBULATORY_CARE_PROVIDER_SITE_OTHER): Payer: 59 | Admitting: Family Medicine

## 2015-11-15 ENCOUNTER — Encounter: Payer: Self-pay | Admitting: Family Medicine

## 2015-11-15 VITALS — BP 122/81 | HR 83 | Temp 98.2°F | Resp 18 | Ht 63.0 in | Wt 122.8 lb

## 2015-11-15 DIAGNOSIS — E119 Type 2 diabetes mellitus without complications: Secondary | ICD-10-CM | POA: Diagnosis not present

## 2015-11-15 LAB — POCT GLYCOSYLATED HEMOGLOBIN (HGB A1C): Hemoglobin A1C: 5.2

## 2015-11-15 MED ORDER — ESCITALOPRAM OXALATE 10 MG PO TABS: 10.0000 mg | ORAL_TABLET | Freq: Every day | ORAL | 1 refills | Status: DC

## 2015-11-15 NOTE — Patient Instructions (Signed)
Follow up in 3 months for depression/anxiety. We will recheck a1c in 3 months, if normal will discontinue diabetes checks.  Continue fasting glucose checks, and if symptoms check.  Call if low <70, and you have symptoms or extreme symptoms go to ED immediately by 911.   Hypoglycemia Hypoglycemia occurs when the glucose in your blood is too low. Glucose is a type of sugar that is your body's main energy source. Hormones, such as insulin and glucagon, control the level of glucose in the blood. Insulin lowers blood glucose and glucagon increases blood glucose. Having too much insulin in your blood stream, or not eating enough food containing sugar, can result in hypoglycemia. Hypoglycemia can happen to people with or without diabetes. It can develop quickly and can be a medical emergency.  CAUSES   Missing or delaying meals.  Not eating enough carbohydrates at meals.  Taking too much diabetes medicine.  Not timing your oral diabetes medicine or insulin doses with meals, snacks, and exercise.  Nausea and vomiting.  Certain medicines.  Severe illnesses, such as hepatitis, kidney disorders, and certain eating disorders.  Increased activity or exercise without eating something extra or adjusting medicines.  Drinking too much alcohol.  A nerve disorder that affects body functions like your heart rate, blood pressure, and digestion (autonomic neuropathy).  A condition where the stomach muscles do not function properly (gastroparesis). Therefore, medicines and food may not absorb properly.  Rarely, a tumor of the pancreas can produce too much insulin. SYMPTOMS   Hunger.  Sweating (diaphoresis).  Change in body temperature.  Shakiness.  Headache.  Anxiety.  Lightheadedness.  Irritability.  Difficulty concentrating.  Dry mouth.  Tingling or numbness in the hands or feet.  Restless sleep or sleep disturbances.  Altered speech and coordination.  Change in mental  status.  Seizures or prolonged convulsions.  Combativeness.  Drowsiness (lethargic).  Weakness.  Increased heart rate or palpitations.  Confusion.  Pale, gray skin color.  Blurred or double vision.  Fainting. DIAGNOSIS  A physical exam and medical history will be performed. Your caregiver may make a diagnosis based on your symptoms. Blood tests and other lab tests may be performed to confirm a diagnosis. Once the diagnosis is made, your caregiver will see if your signs and symptoms go away once your blood glucose is raised.  TREATMENT  Usually, you can easily treat your hypoglycemia when you notice symptoms.  Check your blood glucose. If it is less than 70 mg/dl, take one of the following:   3-4 glucose tablets.    cup juice.    cup regular soda.   1 cup skim milk.   -1 tube of glucose gel.   5-6 hard candies.   Avoid high-fat drinks or food that may delay a rise in blood glucose levels.  Do not take more than the recommended amount of sugary foods, drinks, gel, or tablets. Doing so will cause your blood glucose to go too high.   Wait 10-15 minutes and recheck your blood glucose. If it is still less than 70 mg/dl or below your target range, repeat treatment.   Eat a snack if it is more than 1 hour until your next meal.  There may be a time when your blood glucose may go so low that you are unable to treat yourself at home when you start to notice symptoms. You may need someone to help you. You may even faint or be unable to swallow. If you cannot treat yourself, someone will  need to bring you to the hospital.  [place]  If you have diabetes, follow your diabetes management plan by:  Taking your medicines as directed.  Following your exercise plan.  Following your meal plan. Do not skip meals. Eat on time.  Testing your blood glucose regularly. Check your blood glucose before and after exercise. If you exercise longer or different than  usual, be sure to check blood glucose more frequently.  Wearing your medical alert jewelry that says you have diabetes.  Identify the cause of your hypoglycemia. Then, develop ways to prevent the recurrence of hypoglycemia.  Do not take a hot bath or shower right after an insulin shot.  Always carry treatment with you. Glucose tablets are the easiest to carry.  If you are going to drink alcohol, drink it only with meals.  Tell friends or family members ways to keep you safe during a seizure. This may include removing hard or sharp objects from the area or turning you on your side.  Maintain a healthy weight. SEEK MEDICAL CARE IF:   You are having problems keeping your blood glucose in your target range.  You are having frequent episodes of hypoglycemia.  You feel you might be having side effects from your medicines.  You are not sure why your blood glucose is dropping so low.  You notice a change in vision or a new problem with your vision. SEEK IMMEDIATE MEDICAL CARE IF:   Confusion develops.  A change in mental status occurs.  The inability to swallow develops.  Fainting occurs.   This information is not intended to replace advice given to you by your health care provider. Make sure you discuss any questions you have with your health care provider.   Document Released: 03/13/2005 Document Revised: 03/18/2013 Document Reviewed: 11/17/2014 Elsevier Interactive Patient Education [employer].

## 2015-11-15 NOTE — Progress Notes (Signed)
Patient ID: Laura Steele, female  DOB: 03/01/1976, 40 y.o.   MRN: [id number] Patient Care Team    Relationship Specialty Notifications Start End  Ma Hillock, DO PCP - General Family Medicine  10/22/15     Subjective:  Laura Steele is a 40 y.o.  female present for follow up on abnormal sugars.   Type 2 diabetes mellitus without complication, without long-term current use of insulin ([place]): Pt denies any recurrent hypoglycemic episodes. She does have a new monitor now. Her Fasting glucose have run 114- 77. She has stopped the metformin. A1c repeat is normal.  She needs supplies for new monitor. C-peptide and insulin -ab are pending.    Prior notes:  - Patient presents for an acute office visit with complaints of hypoglycemia 2. She reports last week she had a low blood sugar of 34 and this morning she had a low blood sugar of 58. On both of these occasions she felt shaky. The shakiness was resolved by drinking orange juice or eating sugary snack. Patient's only diabetic medication is Glucophage 500 mg daily. Patient states she did have one episode of diarrhea as well. She was diagnosed as a type II diabetic in the emergency room with one elevated blood sugar, and then started on high-dose metformin. Her A1c in a follow-up 2 days later with not that of even a prediabetic. She states the highest fasting blood sugars she seen have been around 110, usually in the 80s to 90s. She states on both occasions she was having hypoglycemic symptoms she had eaten just a few hours prior. She denies current symptoms, and her point-of-care glucose is normal today. She states she had been feeling really tired as well, otherwise she has been asymptomatic. She denies any chest pain, shortness of breath, dizziness, diaphoresis, nonhealing wounds, numbness or tingling in any of her extremities. She denies syncope.   - Confusing history surrounding diagnosis. Pt was seen inED on 08/04/2015 with acute  illness. She was found to be hyperglycemic to 294 and started on metformin. She followed the following day as a new pt to a PCP and a1c was 5.4. She was having diarrhea from the metformin and dose was reduced to 500 mg QD. She is now tolerating metformin.Marland Kitchen Her fasting BG is 90-100.   Depression with anxiety:  lexapro 10 mg is working well for her. She does not feel she needs an increased dose and would like refills on lexapro 10 mg.   Prior note: Patient was seen with new provider on Aug 05, 2015 and complained of anxiety. She also endorsed difficulty sleeping. She was started on zoloft 25 mg. Today she states the medication is not working for her. She feels that it is making her gain weight and feel bloated. Her anxiety is not well controlled, she is having panic attacks. She is states the triggers she is aware of have been multiple deaths in the family last 3 years and her job.  She has been on medications prior, but she forgets the names of those.    Negative Mood disorder screen  Depression screen [facility] 2/9 10/22/2015  Decreased Interest 0  Down, Depressed, Hopeless 2  PHQ - 2 Score 2  Altered sleeping 3  Tired, decreased energy 2  Change in appetite 3  Feeling bad or failure about yourself  1  Trouble concentrating 0  Moving slowly or fidgety/restless 2  Suicidal thoughts 0  PHQ-9 Score 13   GAD 7 : Generalized  Anxiety Score 10/22/2015  Nervous, Anxious, on Edge 2  Control/stop worrying 1  Worry too much - different things 1  Trouble relaxing 2  Restless 1  Easily annoyed or irritable 2  Afraid - awful might happen 1  Total GAD 7 Score 10  Anxiety Difficulty Very difficult    Immunization History  Administered Date(s) Administered  . Pneumococcal Conjugate-13 10/22/2015     Past Medical History:  Diagnosis Date  . Allergy   . Anxiety   . Diabetes mellitus without complication ([place])    No Known Allergies Past Surgical History:  Procedure Laterality Date  . ABDOMINAL  HYSTERECTOMY     Family History  Problem Relation Age of Onset  . Diabetes Mother    Social History   Social History  . Marital status: Single    Spouse name: N/A  . Number of children: N/A  . Years of education: N/A   Occupational History  . Not on file.   Social History Main Topics  . Smoking status: Current Every Day Smoker    Packs/day: 0.50    Types: Cigarettes  . Smokeless tobacco: Never Used  . Alcohol use Yes     Comment: Pt states was drinkning 40 0z of beer a night. but stopped since sunday.  . Drug use: No  . Sexual activity: Yes   Other Topics Concern  . Not on file   Social History Narrative  . No narrative on file     Medication List       Accurate as of 11/15/15  1:08 PM. Always use your most recent med list.          escitalopram 10 MG tablet Commonly known as:  LEXAPRO Take 1 tablet (10 mg total) by mouth daily.   glucose 4 GM chewable tablet Chew 1 tablet (4 g total) by mouth once as needed for low blood sugar.   metFORMIN 500 MG tablet Commonly known as:  GLUCOPHAGE Take 1 tablet (500 mg total) by mouth daily with breakfast.        Recent Results (from the past 2160 hour(s))  Urine Microalbumin w/creat. ratio     Status: None   Collection Time: 10/22/15  4:09 PM  Result Value Ref Range   Creatinine, Urine 59 20 - 320 mg/dL   Microalb, Ur 0.2 Not estab mg/dL   Microalb Creat Ratio 3 <30 mcg/mg creat    Comment: The ADA has defined abnormalities in albumin excretion as follows:           Category           Result                            (mcg/mg creatinine)                 Normal:    <30       Microalbuminuria:    30 - 299   Clinical albuminuria:    > or = 300   The ADA recommends that at least two of three specimens collected within a 3 - 6 month period be abnormal before considering a patient to be within a diagnostic category.     POCT glucose (manual entry)     Status: Normal   Collection Time: 11/03/15  4:36 PM    Result Value Ref Range   POC Glucose 98 70 - 99 mg/dl  Glucose     Status: None  Collection Time: 11/12/15  3:01 PM  Result Value Ref Range   Glucose, Bld 82 65 - 99 mg/dL  Hemoglobin A1c     Status: None   Collection Time: 11/12/15  3:01 PM  Result Value Ref Range   Hgb A1c MFr Bld 4.8 <5.7 %    Comment:   For the purpose of screening for the presence of diabetes:   <5.7%       Consistent with the absence of diabetes 5.7-6.4 %   Consistent with increased risk for diabetes (prediabetes) >=6.5 %     Consistent with diabetes   This assay result is consistent with a decreased risk of diabetes.   Currently, no consensus exists regarding use of hemoglobin A1c for diagnosis of diabetes in children.   According to American Diabetes Association (ADA) guidelines, hemoglobin A1c <7.0% represents optimal control in non-pregnant diabetic patients. Different metrics may apply to specific patient populations. Standards of Medical Care in Diabetes (ADA).      Mean Plasma Glucose 91 mg/dL  Insulin antibodies, blood     Status: None (Preliminary result)   Collection Time: 11/12/15  3:01 PM  Result Value Ref Range   Insulin Antibodies, Human    POCT glycosylated hemoglobin (Hb A1C)     Status: Normal   Collection Time: 11/15/15  9:41 AM  Result Value Ref Range   Hemoglobin A1C 5.2     Dg Chest 2 View  Result Date: 08/01/2015 CLINICAL DATA:  Chest pain and tachycardia EXAM: CHEST  2 VIEW COMPARISON:  March 05, 2007 FINDINGS: There is no edema or consolidation. The heart size and pulmonary vascularity are normal. No adenopathy. No apparent bone lesions. No pneumothorax. IMPRESSION: No edema or consolidation. Electronically Signed   By: Lowella Grip III M.D.   On: 08/01/2015 10:16     ROS: 14 pt review of systems performed and negative (unless mentioned in an HPI)  Objective: BP 122/81 (BP Location: Right Arm, Patient Position: Sitting, Cuff Size: Normal)   Pulse 83   Temp 98.2  F (36.8 C)   Resp 18   Ht 5\' 3"  (1.6 m)   Wt 122 lb 12.8 oz (55.7 kg)   SpO2 97%   BMI 21.75 kg/m  Gen: Afebrile. No acute distress. Nontoxic in appearance, well-developed, well-nourished,  Thin female.  HENT: AT. [place]. MMM, no oral lesions Eyes:Pupils Equal Round Reactive to light, Extraocular movements intact,  Conjunctiva without redness, discharge or icterus. Neck/lymp/endocrine: Supple,no lymphadenopathy, no thyromegaly CV: RRR, no edema, +2/4 P posterior tibialis pulses.  Chest: CTAB, no wheeze, rhonchi or crackles. Diminished lung sounds.  Abd: Soft. NTND. BS present. Skin: No rashes, purpura or petechiae. Warm and well-perfused. Skin intact. Neuro/Msk: Normal gait. PERLA. EOMi. Alert. Oriented x3.   Psych: Normal affect, dress and demeanor. Normal speech. Normal thought content and judgment.   Assessment/plan: Laura Steele is a 40 y.o. female present for f/u on DM and depression/anxiety. Type 2 diabetes mellitus without complication, without long-term current use of insulin ([facility]) -  5.4--> 4.8 today - Prevnar administered today. PSV23 09/2016 - Pt has FMLA papers needing completed - medication has been discontinued. Glucose readings have been normal.  - Pt to continue monitoring fasting glucose and recording, if all labs normal will discontinue daily monitoring unless symptomatic.  - Pt to follow immediately if symptoms occur, by EMS if necessary.  - will check again at 3 mos (with anxiety appt) if normal discontinue all monitoring.   Depression with anxiety  -  Continue  lexarpo 10 mg, refill provided. Pt doing well.  - PHQ next visit.  - f/u 3 mos     Return in about 3 months (around 02/15/2016).  Electronically signed by: Howard Pouch, DO [place]

## 2015-11-16 DIAGNOSIS — Z7689 Persons encountering health services in other specified circumstances: Secondary | ICD-10-CM

## 2015-11-17 ENCOUNTER — Telehealth: Payer: Self-pay | Admitting: *Deleted

## 2015-11-17 ENCOUNTER — Other Ambulatory Visit: Payer: Self-pay

## 2015-11-17 MED ORDER — METFORMIN HCL 500 MG PO TABS: 500.0000 mg | ORAL_TABLET | Freq: Every day | ORAL | 3 refills | Status: DC

## 2015-11-17 NOTE — Telephone Encounter (Signed)
Notified patient Laura Steele papers have been completed by Dr Raoul Pitch. Patient requested they be mailed to her home address. Faxed copy to patient employer. Copy made for chart. Originals mailed to patient.

## 2015-11-18 LAB — INSULIN ANTIBODIES, BLOOD: Insulin Antibodies, Human: <0.4 U/mL (ref <0.4)

## 2015-11-19 ENCOUNTER — Telehealth: Payer: Self-pay | Admitting: Family Medicine

## 2015-11-19 NOTE — Telephone Encounter (Signed)
Left message with lab result on patient voice mail.

## 2015-11-19 NOTE — Telephone Encounter (Signed)
Please call patient: 1 of the 2  send out labs we have been waiting on has returned. And it is normal and reassuring. I wanted to reach out to her since it is Friday before the weekend to give her this result. We will still call her once we receive the other results sometime next week.

## 2015-12-03 ENCOUNTER — Telehealth: Payer: Self-pay | Admitting: Family Medicine

## 2015-12-03 NOTE — Telephone Encounter (Signed)
Please call pt: - please apologize about the delay on the 2nd results. We had to have lab fax them to [place] today.  - There are 2 parts to the last lab, and part is normal and the other part is lower than normal. During the time of that particular test it showed lower levels of insulin production. I would like to see her again next week, and review in detail and talk about potential causes and discuss further work up and how she has been feeling.  - IF she asks lower insulin production can be caused from diabetes or chronic pancreatitis.

## 2015-12-06 NOTE — Telephone Encounter (Signed)
Spoke with patient reviewed lab results and scheduled follow up appt.

## 2015-12-17 ENCOUNTER — Encounter: Payer: Self-pay | Admitting: Family Medicine

## 2015-12-17 ENCOUNTER — Ambulatory Visit (INDEPENDENT_AMBULATORY_CARE_PROVIDER_SITE_OTHER): Payer: 59 | Admitting: Family Medicine

## 2015-12-17 VITALS — BP 120/82 | HR 81 | Temp 98.4°F | Resp 20 | Wt 125.0 lb

## 2015-12-17 DIAGNOSIS — M545 Low back pain, unspecified: Secondary | ICD-10-CM | POA: Insufficient documentation

## 2015-12-17 DIAGNOSIS — R7989 Other specified abnormal findings of blood chemistry: Secondary | ICD-10-CM

## 2015-12-17 DIAGNOSIS — Z23 Encounter for immunization: Secondary | ICD-10-CM

## 2015-12-17 DIAGNOSIS — R1012 Left upper quadrant pain: Secondary | ICD-10-CM

## 2015-12-17 MED ORDER — METHYLPREDNISOLONE ACETATE 80 MG/ML IJ SUSP
80.0000 mg | Freq: Once | INTRAMUSCULAR | Status: AC
  Administered 2015-12-17: 80 mg via INTRAMUSCULAR

## 2015-12-17 NOTE — Patient Instructions (Signed)
Depo medrol injection to day for back. NSAIDS, heat, stretches.  If not improved in 3-4 weeks will need to look further.  I ordering an abd [place] to look at your pancreas. This will be schedule next week sometime.     Acute Pancreatitis Acute pancreatitis is a disease in which the pancreas becomes suddenly inflamed. The pancreas is a large gland located behind your stomach. The pancreas produces enzymes that help digest food. The pancreas also releases the hormones glucagon and insulin that help regulate blood sugar. Damage to the pancreas occurs when the digestive enzymes from the pancreas are activated and begin attacking the pancreas before being released into the intestine. Most acute attacks last a couple of days and can cause serious complications. Some people become dehydrated and develop low blood pressure. In severe cases, bleeding into the pancreas can lead to shock and can be life-threatening. The lungs, heart, and kidneys may fail. CAUSES  Pancreatitis can happen to anyone. In some cases, the cause is unknown. Most cases are caused by:  Alcohol abuse.  Gallstones. Other less common causes are:  Certain medicines.  Exposure to certain chemicals.  Infection.  Damage caused by an accident (trauma).  Abdominal surgery. SYMPTOMS   Pain in the upper abdomen that may radiate to the back.  Tenderness and swelling of the abdomen.  Nausea and vomiting. DIAGNOSIS  Your caregiver will perform a physical exam. Blood and stool tests may be done to confirm the diagnosis. Imaging tests may also be done, such as X-rays, CT scans, or an ultrasound of the abdomen. TREATMENT  Treatment usually requires a stay in the hospital. Treatment may include:  Pain medicine.  Fluid replacement through an intravenous line (IV).  Placing a tube in the stomach to remove stomach contents and control vomiting.  Not eating for 3 or 4 days. This gives your pancreas a rest, because enzymes are not  being produced that can cause further damage.  Antibiotic medicines if your condition is caused by an infection.  Surgery of the pancreas or gallbladder. HOME CARE INSTRUCTIONS   Follow the diet advised by your caregiver. This may involve avoiding alcohol and decreasing the amount of fat in your diet.  Eat smaller, more frequent meals. This reduces the amount of digestive juices the pancreas produces.  Drink enough fluids to keep your urine clear or pale yellow.  Only take over-the-counter or prescription medicines as directed by your caregiver.  Avoid drinking alcohol if it caused your condition.  Do not smoke.  Get plenty of rest.  Check your blood sugar at home as directed by your caregiver.  Keep all follow-up appointments as directed by your caregiver. SEEK MEDICAL CARE IF:   You do not recover as quickly as expected.  You develop new or worsening symptoms.  You have persistent pain, weakness, or nausea.  You recover and then have another episode of pain. SEEK IMMEDIATE MEDICAL CARE IF:   You are unable to eat or keep fluids down.  Your pain becomes severe.  You have a fever or persistent symptoms for more than 2 to 3 days.  You have a fever and your symptoms suddenly get worse.  Your skin or the white part of your eyes turn yellow (jaundice).  You develop vomiting.  You feel dizzy, or you faint.  Your blood sugar is high (over 300 mg/dL). MAKE SURE YOU:   Understand these instructions.  Will watch your condition.  Will get help right away if you are not  doing well or get worse.   This information is not intended to replace advice given to you by your health care provider. Make sure you discuss any questions you have with your health care provider.   Document Released: 03/13/2005 Document Revised: 09/12/2011 Document Reviewed: 06/22/2011 Elsevier Interactive Patient Education [employer].

## 2015-12-17 NOTE — Progress Notes (Signed)
Patient ID: Laura Steele, female  DOB: April 13, 1975, 40 y.o.   MRN: [id number] Patient Care Team    Relationship Specialty Notifications Start End  Ma Hillock, DO PCP - General Family Medicine  10/22/15     Subjective:  Laura Steele is a 40 y.o.  female present for follow up on abnormal labs  Low back pain: new complaint of low back pain of 1 week duration. Left sided back pain, radiates to buttocks. She denies injury or arthritis history. She reports she has ramps she has to walk on for 10 hours a day. She has "good" shoes, so she does not think that is the problem. She has been taking advil daily. She has not used heat therapy. She denies bladder or bowel problems.   Abnormal labs: Patient has had recent abnormal labs with a low serum insulin 1.4, normal C-peptide. Patient was seen in the ED a few months ago with abdominal pain/chest pain with elevated glucose and was diagnosed as a diabetic. She was started on metformin, established with another provider for PCP. She then switched her PCP to this provider. Her A1c was 5.4. She has been removed off the metformin and doing well. She has been checking her blood sugars fasting and range between 90 and 100. She has had 2 episodes of possible hypoglycemia, that have not reoccurred. She denies any recurrent abdominal or chest pain currently. She did have the "stomach flu" last week and was seen in urgent care. Patient states she drinks approximately 4 alcoholic drinks nightly on the weekends. She does not drink alcohol during the week. Lipid panel collected May 2017 is within normal limits. She does not have a history of gallstones. She has had no abdominal imaging. She does endorse frequent left upper quadrant discomfort.   Negative Mood disorder screen  Depression screen [facility] 2/9 10/22/2015  Decreased Interest 0  Down, Depressed, Hopeless 2  PHQ - 2 Score 2  Altered sleeping 3  Tired, decreased energy 2  Change in appetite 3    Feeling bad or failure about yourself  1  Trouble concentrating 0  Moving slowly or fidgety/restless 2  Suicidal thoughts 0  PHQ-9 Score 13   GAD 7 : Generalized Anxiety Score 10/22/2015  Nervous, Anxious, on Edge 2  Control/stop worrying 1  Worry too much - different things 1  Trouble relaxing 2  Restless 1  Easily annoyed or irritable 2  Afraid - awful might happen 1  Total GAD 7 Score 10  Anxiety Difficulty Very difficult    Immunization History  Administered Date(s) Administered  . Influenza,inj,Quad PF,36+ Mos 12/17/2015  . Pneumococcal Conjugate-13 10/22/2015     Past Medical History:  Diagnosis Date  . Allergy   . Anxiety   . Diabetes mellitus without complication ([place])    No Known Allergies Past Surgical History:  Procedure Laterality Date  . ABDOMINAL HYSTERECTOMY     Family History  Problem Relation Age of Onset  . Diabetes Mother   . Breast cancer Mother   . Skin cancer Maternal Grandfather    Social History   Social History  . Marital status: Single    Spouse name: N/A  . Number of children: N/A  . Years of education: N/A   Occupational History  . Not on file.   Social History Main Topics  . Smoking status: Current Every Day Smoker    Packs/day: 0.50    Types: Cigarettes  . Smokeless tobacco: Never Used  .  Alcohol use Yes     Comment: Pt states was drinkning 40 0z of beer a night. but stopped since sunday.  . Drug use: No  . Sexual activity: Yes   Other Topics Concern  . Not on file   Social History Narrative  . No narrative on file     Medication List       Accurate as of 12/17/15  7:05 PM. Always use your most recent med list.          escitalopram 10 MG tablet Commonly known as:  LEXAPRO Take 1 tablet (10 mg total) by mouth daily.   glucose 4 GM chewable tablet Chew 1 tablet (4 g total) by mouth once as needed for low blood sugar.   metFORMIN 500 MG tablet Commonly known as:  GLUCOPHAGE Take 1 tablet (500 mg total)  by mouth daily with breakfast.        Recent Results (from the past 2160 hour(s))  Urine Microalbumin w/creat. ratio     Status: None   Collection Time: 10/22/15  4:09 PM  Result Value Ref Range   Creatinine, Urine 59 20 - 320 mg/dL   Microalb, Ur 0.2 Not estab mg/dL   Microalb Creat Ratio 3 <30 mcg/mg creat    Comment: The ADA has defined abnormalities in albumin excretion as follows:           Category           Result                            (mcg/mg creatinine)                 Normal:    <30       Microalbuminuria:    30 - 299   Clinical albuminuria:    > or = 300   The ADA recommends that at least two of three specimens collected within a 3 - 6 month period be abnormal before considering a patient to be within a diagnostic category.     POCT glucose (manual entry)     Status: Normal   Collection Time: 11/03/15  4:36 PM  Result Value Ref Range   POC Glucose 98 70 - 99 mg/dl  Glucose     Status: None   Collection Time: 11/12/15  3:01 PM  Result Value Ref Range   Glucose, Bld 82 65 - 99 mg/dL  Hemoglobin A1c     Status: None   Collection Time: 11/12/15  3:01 PM  Result Value Ref Range   Hgb A1c MFr Bld 4.8 <5.7 %    Comment:   For the purpose of screening for the presence of diabetes:   <5.7%       Consistent with the absence of diabetes 5.7-6.4 %   Consistent with increased risk for diabetes (prediabetes) >=6.5 %     Consistent with diabetes   This assay result is consistent with a decreased risk of diabetes.   Currently, no consensus exists regarding use of hemoglobin A1c for diagnosis of diabetes in children.   According to American Diabetes Association (ADA) guidelines, hemoglobin A1c <7.0% represents optimal control in non-pregnant diabetic patients. Different metrics may apply to specific patient populations. Standards of Medical Care in Diabetes (ADA).      Mean Plasma Glucose 91 mg/dL  Insulin antibodies, blood     Status: None   Collection Time:  11/12/15  3:01 PM  Result  Value Ref Range   Insulin Antibodies, Human <0.4 <0.4 U/mL  POCT glycosylated hemoglobin (Hb A1C)     Status: Normal   Collection Time: 11/15/15  9:41 AM  Result Value Ref Range   Hemoglobin A1C 5.2     Dg Chest 2 View  Result Date: 08/01/2015 CLINICAL DATA:  Chest pain and tachycardia EXAM: CHEST  2 VIEW COMPARISON:  March 05, 2007 FINDINGS: There is no edema or consolidation. The heart size and pulmonary vascularity are normal. No adenopathy. No apparent bone lesions. No pneumothorax. IMPRESSION: No edema or consolidation. Electronically Signed   By: Lowella Grip III M.D.   On: 08/01/2015 10:16    ROS: 14 pt review of systems performed and negative (unless mentioned in an HPI)  Objective: BP 120/82 (BP Location: Left Arm, Patient Position: Sitting, Cuff Size: Normal)   Pulse 81   Temp 98.4 F (36.9 C)   Resp 20   Wt 125 lb (56.7 kg)   SpO2 96%   BMI 22.14 kg/m  Gen: Afebrile. No acute distress. Nontoxic in appearance, well-developed, well-nourished,  Thin female.  HENT: AT. [place]. MMM, no oral lesions Eyes:Pupils Equal Round Reactive to light, Extraocular movements intact,  Conjunctiva without redness, discharge or icterus. Neck/lymp/endocrine: Supple,no lymphadenopathy, no thyromegaly CV: RRR, no edema Chest: CTAB, no wheeze, rhonchi or crackles. Diminished lung sounds.  Abd: Soft. NTND. BS present. Skin: No rashes, purpura or petechiae. Warm and well-perfused. Skin intact. Neuro/Msk: Normal gait. PERLA. EOMi. Alert. Oriented x3.  Tender to palpation left SI joint. Neurovascularly intact distally. Psych: Normal affect, dress and demeanor. Normal speech. Normal thought content and judgment.   Assessment/plan: Laura Steele is a 40 y.o. female present for f/u on DM and depression/anxiety. Influenza vaccine administered - Flu Vaccine QUAD 36+ mos PF IM (Fluarix & Fluzone Quad PF) Low back pain, unspecified back pain laterality, with sciatica  presence unspecified - Discussed rest, NSAIDs, heat therapy. Offered IM steroids today. - methylPREDNISolone acetate (DEPO-MEDROL) injection 80 mg; Inject 1 mL (80 mg total) into the muscle once. - Patient is to follow-up in 3-4 weeks if no improvement.  Inappropriately low serum insulin - Patient to continue monitoring fasting blood sugars, and 2 hours after meals. - Abdominal ultrasound ordered today. - Patient currently pain-free, suspect abnormal lab and history of discomfort could be secondary to chronic pancreatitis. - Depending on results, consider endocrine referral for low serum insulin and glucose instability.   Return in about 4 weeks (around 01/14/2016).  Electronically signed by: Howard Pouch, DO [place]

## 2015-12-23 ENCOUNTER — Ambulatory Visit (HOSPITAL_BASED_OUTPATIENT_CLINIC_OR_DEPARTMENT_OTHER)
Admission: RE | Admit: 2015-12-23 | Discharge: 2015-12-23 | Disposition: A | Discharge status: Home / Self Care | Payer: 59 | Source: Ambulatory Visit | Attending: Family Medicine | Admitting: Family Medicine

## 2015-12-23 ENCOUNTER — Encounter: Payer: Self-pay | Admitting: Family Medicine

## 2015-12-23 DIAGNOSIS — R1012 Left upper quadrant pain: Secondary | ICD-10-CM | POA: Diagnosis present

## 2015-12-23 DIAGNOSIS — R7989 Other specified abnormal findings of blood chemistry: Secondary | ICD-10-CM | POA: Diagnosis not present

## 2015-12-24 ENCOUNTER — Telehealth: Payer: Self-pay | Admitting: Family Medicine

## 2015-12-24 NOTE — Telephone Encounter (Signed)
Spoke with patient reviewed  results and information. Patient verbalized understanding. 

## 2015-12-24 NOTE — Telephone Encounter (Signed)
Please call pt: - her [place] was normal. I would like to send her to endocrine with her low insulin levels and difficult history to date on elevated sugars, then hypoglycemic events. If she is agreeable will place this referral today - As far as her back, if worsening I would want to see her back. If she is seeing some improvement then I would want her to give it time to heal.

## 2015-12-26 ENCOUNTER — Encounter: Payer: Self-pay | Admitting: Family Medicine

## 2016-01-26 ENCOUNTER — Other Ambulatory Visit (HOSPITAL_BASED_OUTPATIENT_CLINIC_OR_DEPARTMENT_OTHER): Payer: Self-pay | Admitting: Preventative Medicine

## 2016-01-26 ENCOUNTER — Ambulatory Visit (HOSPITAL_BASED_OUTPATIENT_CLINIC_OR_DEPARTMENT_OTHER)
Admission: RE | Admit: 2016-01-26 | Discharge: 2016-01-26 | Disposition: A | Discharge status: Home / Self Care | Payer: Worker's Compensation | Source: Ambulatory Visit | Attending: Preventative Medicine | Admitting: Preventative Medicine

## 2016-01-26 DIAGNOSIS — S60212A Contusion of left wrist, initial encounter: Secondary | ICD-10-CM | POA: Insufficient documentation

## 2016-01-26 DIAGNOSIS — X58XXXA Exposure to other specified factors, initial encounter: Secondary | ICD-10-CM | POA: Insufficient documentation

## 2016-04-05 ENCOUNTER — Encounter: Payer: Self-pay | Admitting: Family Medicine

## 2016-05-08 ENCOUNTER — Other Ambulatory Visit: Payer: Self-pay | Admitting: *Deleted

## 2016-05-08 ENCOUNTER — Encounter: Payer: Self-pay | Admitting: *Deleted

## 2016-05-08 MED ORDER — ESCITALOPRAM OXALATE 10 MG PO TABS: 10.0000 mg | ORAL_TABLET | Freq: Every day | ORAL | 0 refills | Status: DC

## 2016-06-08 ENCOUNTER — Encounter: Payer: Self-pay | Admitting: Family Medicine

## 2016-06-09 ENCOUNTER — Encounter: Payer: Self-pay | Admitting: Family Medicine

## 2016-06-09 ENCOUNTER — Ambulatory Visit (INDEPENDENT_AMBULATORY_CARE_PROVIDER_SITE_OTHER): Payer: 59 | Admitting: Family Medicine

## 2016-06-09 ENCOUNTER — Ambulatory Visit: Payer: Self-pay | Admitting: Family Medicine

## 2016-06-09 DIAGNOSIS — F419 Anxiety disorder, unspecified: Secondary | ICD-10-CM | POA: Diagnosis not present

## 2016-06-09 MED ORDER — ESCITALOPRAM OXALATE 20 MG PO TABS: 20.0000 mg | ORAL_TABLET | Freq: Every day | ORAL | 1 refills | Status: DC

## 2016-06-09 NOTE — Progress Notes (Signed)
Patient ID: Laura Steele, female  DOB: [birth year], 41 y.o.   MRN: [id number] Patient Care Team    Relationship Specialty Notifications Start End  Ma Hillock, DO PCP - General Family Medicine  10/22/15     Subjective: CC: anxiety Laura Steele is a 41 y.o.  female present for follow up on anxiety  Depression with anxiety Patient is working 3 rd shift now, and it will be long term. Her chilsren were in a car accident and she is now helping financially with her sons bills. He is living with her as well. 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 She is only sleeping 6 interrupted hours a day. She feels she is quick to anger and and has increased anxiety.     Prior note: Patient was seen with new provider on Aug 05, 2015 and complained of anxiety. She also endorsed difficulty sleeping. She was started on zoloft 25 mg. Today she states the medication is not working for her. She feels that it is making her gain weight and feel bloated. Her anxiety is not well controlled, she is having panic attacks. She is states the triggers she is aware of have been multiple deaths in the family last 3 years and her job.  She has been on medications prior, but she forgets the names of those.     Depression screen [facility] 2/9 06/09/2016 10/22/2015  Decreased Interest 1 0  Down, Depressed, Hopeless 0 2  PHQ - 2 Score 1 2  Altered sleeping 1 3  Tired, decreased energy 2 2  Change in appetite 0 3  Feeling bad or failure about yourself  0 1  Trouble concentrating 0 0  Moving slowly or fidgety/restless 1 2  Suicidal thoughts 0 0  PHQ-9 Score 5 13   GAD 7 : Generalized Anxiety Score 06/09/2016 10/22/2015  Nervous, Anxious, on Edge 2 2  Control/stop worrying 3 1  Worry too much - different things 3 1  Trouble relaxing 3 2  Restless 2 1  Easily annoyed or irritable 3 2  Afraid - awful might happen 2 1  Total GAD 7 Score 18 10  Anxiety Difficulty Very difficult Very difficult     Immunization History  Administered Date(s) Administered  . Hepatitis B, adult 03/24/2014, 04/24/2014, 09/23/2014  . Influenza,inj,Quad PF,36+ Mos 12/17/2015  . Pneumococcal Conjugate-13 10/22/2015     Past Medical History:  Diagnosis Date  . Allergy   . Anxiety   . Diabetes mellitus without complication ([place])    No Known Allergies Past Surgical History:  Procedure Laterality Date  . ABDOMINAL HYSTERECTOMY     Family History  Problem Relation Age of Onset  . Diabetes Mother   . Breast cancer Mother   . Skin cancer Maternal Grandfather    Social History   Social History  . Marital status: Single    Spouse name: N/A  . Number of children: N/A  . Years of education: N/A   Occupational History  . Not on file.   Social History Main Topics  . Smoking status: Current Every Day Smoker    Packs/day: 0.50    Types: Cigarettes  . Smokeless tobacco: Never Used  . Alcohol use Yes     Comment: Pt states was drinkning 40 0z of beer a night. but stopped since sunday.  . Drug use: No  . Sexual activity: Yes   Other Topics Concern  . Not on file   Social History Narrative  .  No narrative on file   Allergies as of 06/09/2016   No Known Allergies     Medication List       Accurate as of 06/09/16  8:48 AM. Always use your most recent med list.          escitalopram 10 MG tablet Commonly known as:  LEXAPRO Take 1 tablet (10 mg total) by mouth daily.   glucose 4 GM chewable tablet Chew 1 tablet (4 g total) by mouth once as needed for low blood sugar.   metFORMIN 500 MG tablet Commonly known as:  GLUCOPHAGE Take 1 tablet (500 mg total) by mouth daily with breakfast.        No results found for this or any previous visit (from the past 2160 hour(s)).  Dg Chest 2 View  Result Date: 08/01/2015 CLINICAL DATA:  Chest pain and tachycardia EXAM: CHEST  2 VIEW COMPARISON:  March 05, 2007 FINDINGS: There is no edema or consolidation. The heart size and pulmonary  vascularity are normal. No adenopathy. No apparent bone lesions. No pneumothorax. IMPRESSION: No edema or consolidation. Electronically Signed   By: Lowella Grip III M.D.   On: 08/01/2015 10:16    ROS: 14 pt review of systems performed and negative (unless mentioned in an HPI)  Objective: BP 132/87 (BP Location: Left Arm, Patient Position: Sitting, Cuff Size: Normal)   Pulse 86   Temp 98.2 F (36.8 C)   Resp 20   Wt 129 lb 8 oz (58.7 kg)   SpO2 99%   BMI 22.94 kg/m  Gen: Afebrile. No acute distress.  Eyes:Pupils Equal Round Reactive to light, Extraocular movements intact,  Conjunctiva without redness, discharge or icterus. Neck/lymp/endocrine: Supple,no lymphadenopathy, no thyromegaly CV: RRR, +2/4 P posterior tibialis pulses Psych: Normal affect, dress and demeanor. Normal speech. Normal thought content and judgment..   Assessment/plan: Laura Steele is a 41 y.o. female present for  Depression with anxiety - uncontrolled - rpt PHQ and GAD assessments completed today.  - Increase lexapro today to 20 mg QD - sleep hygiene.  - refills provided for 6 months.  - F/U 6 mos, sooner if needed.  > 25 minutes spent with patient, >50% of time spent face to face counseling and/or coordinating care.    Return in about 3 months (around 09/09/2016), or depression/anxiety.  Electronically signed by: Howard Pouch, DO [place]

## 2016-06-09 NOTE — Patient Instructions (Signed)
Increase lexapro to 20 mg a day.  Try the sleep hygiene tricks we discussed today.  Getting at least 6-8 hours of solid sleep a night is recommended.     Generalized Anxiety Disorder, Adult Generalized anxiety disorder (GAD) is a mental health disorder. People with this condition constantly worry about everyday events. Unlike normal anxiety, worry related to GAD is not triggered by a specific event. These worries also do not fade or get better with time. GAD interferes with life functions, including relationships, work, and school. GAD can vary from mild to severe. People with severe GAD can have intense waves of anxiety with physical symptoms (panic attacks). What are the causes? The exact cause of GAD is not known. What increases the risk? This condition is more likely to develop in:  Women.  People who have a family history of anxiety disorders.  People who are very shy.  People who experience very stressful life events, such as the death of a loved one.  People who have a very stressful family environment. What are the signs or symptoms? People with GAD often worry excessively about many things in their lives, such as their health and family. They may also be overly concerned about:  Doing well at work.  Being on time.  Natural disasters.  Friendships. Physical symptoms of GAD include:  Fatigue.  Muscle tension or having muscle twitches.  Trembling or feeling shaky.  Being easily startled.  Feeling like your heart is pounding or racing.  Feeling out of breath or like you cannot take a deep breath.  Having trouble falling asleep or staying asleep.  Sweating.  Nausea, diarrhea, or irritable bowel syndrome (IBS).  Headaches.  Trouble concentrating or remembering facts.  Restlessness.  Irritability. How is this diagnosed? Your health care provider can diagnose GAD based on your symptoms and medical history. You will also have a physical exam. The health  care provider will ask specific questions about your symptoms, including how severe they are, when they started, and if they come and go. Your health care provider may ask you about your use of alcohol or drugs, including prescription medicines. Your health care provider may refer you to a mental health specialist for further evaluation. Your health care provider will do a thorough examination and may perform additional tests to rule out other possible causes of your symptoms. To be diagnosed with GAD, a person must have anxiety that:  Is out of his or her control.  Affects several different aspects of his or her life, such as work and relationships.  Causes distress that makes him or her unable to take part in normal activities.  Includes at least three physical symptoms of GAD, such as restlessness, fatigue, trouble concentrating, irritability, muscle tension, or sleep problems. Before your health care provider can confirm a diagnosis of GAD, these symptoms must be present more days than they are not, and they must last for six months or longer. How is this treated? The following therapies are usually used to treat GAD:  Medicine. Antidepressant medicine is usually prescribed for long-term daily control. Antianxiety medicines may be added in severe cases, especially when panic attacks occur.  Talk therapy (psychotherapy). Certain types of talk therapy can be helpful in treating GAD by providing support, education, and guidance. Options include:  Cognitive behavioral therapy (CBT). People learn coping skills and techniques to ease their anxiety. They learn to identify unrealistic or negative thoughts and behaviors and to replace them with positive ones.  Acceptance  and commitment therapy (ACT). This treatment teaches people how to be mindful as a way to cope with unwanted thoughts and feelings.  Biofeedback. This process trains you to manage your body's response (physiological response)  through breathing techniques and relaxation methods. You will work with a therapist while machines are used to monitor your physical symptoms.  Stress management techniques. These include yoga, meditation, and exercise. A mental health specialist can help determine which treatment is best for you. Some people see improvement with one type of therapy. However, other people require a combination of therapies. Follow these instructions at home:  Take over-the-counter and prescription medicines only as told by your health care provider.  Try to maintain a normal routine.  Try to anticipate stressful situations and allow extra time to manage them.  Practice any stress management or self-calming techniques as taught by your health care provider.  Do not punish yourself for setbacks or for not making progress.  Try to recognize your accomplishments, even if they are small.  Keep all follow-up visits as told by your health care provider. This is important. Contact a health care provider if:  Your symptoms do not get better.  Your symptoms get worse.  You have signs of depression, such as:  A persistently sad, cranky, or irritable mood.  Loss of enjoyment in activities that used to bring you joy.  Change in weight or eating.  Changes in sleeping habits.  Avoiding friends or family members.  Loss of energy for normal tasks.  Feelings of guilt or worthlessness. Get help right away if:  You have serious thoughts about hurting yourself or others. If you ever feel like you may hurt yourself or others, or have thoughts about taking your own life, get help right away. You can go to your nearest emergency department or call:  Your local emergency services (911 in the [place]  A suicide crisis helpline, such as the [place] at 310-496-7868. This is open 24 hours a day. Summary  Generalized anxiety disorder (GAD) is a mental health disorder that involves  worry that is not triggered by a specific event.  People with GAD often worry excessively about many things in their lives, such as their health and family.  GAD may cause physical symptoms such as restlessness, trouble concentrating, sleep problems, frequent sweating, nausea, diarrhea, headaches, and trembling or muscle twitching.  A mental health specialist can help determine which treatment is best for you. Some people see improvement with one type of therapy. However, other people require a combination of therapies. This information is not intended to replace advice given to you by your health care provider. Make sure you discuss any questions you have with your health care provider. Document Released: 07/08/2012 Document Revised: 02/01/2016 Document Reviewed: 02/01/2016 Elsevier Interactive Patient Education  2017 [employer].

## 2016-07-08 ENCOUNTER — Encounter: Payer: Self-pay | Admitting: Family Medicine

## 2016-07-14 ENCOUNTER — Ambulatory Visit: Payer: Self-pay | Admitting: Family Medicine

## 2016-07-14 ENCOUNTER — Encounter: Payer: Self-pay | Admitting: Family Medicine

## 2016-10-25 ENCOUNTER — Encounter: Payer: Self-pay | Admitting: Family Medicine

## 2016-10-31 ENCOUNTER — Encounter: Payer: Self-pay | Admitting: Family Medicine

## 2016-10-31 ENCOUNTER — Ambulatory Visit (INDEPENDENT_AMBULATORY_CARE_PROVIDER_SITE_OTHER): Payer: 59 | Admitting: Family Medicine

## 2016-10-31 VITALS — BP 132/85 | HR 69 | Temp 98.1°F | Resp 20 | Ht 63.0 in | Wt 134.5 lb

## 2016-10-31 DIAGNOSIS — M654 Radial styloid tenosynovitis [de Quervain]: Secondary | ICD-10-CM

## 2016-10-31 MED ORDER — NAPROXEN 500 MG PO TABS: 500.0000 mg | ORAL_TABLET | Freq: Two times a day (BID) | ORAL | 0 refills | Status: DC

## 2016-10-31 NOTE — Patient Instructions (Signed)
De Quervain Tenosynovitis Tendons attach muscles to bones. They also help with joint movements. When tendons become irritated or swollen, it is called tendinitis. The extensor pollicis brevis (EPB) tendon connects the EPB muscle to a bone that is near the base of the thumb. The EPB muscle helps to straighten and extend the thumb. De Quervain tenosynovitis is a condition in which the EPB tendon lining (sheath) becomes irritated, thickened, and swollen. This condition is sometimes called stenosing tenosynovitis. This condition causes pain on the thumb side of the back of the wrist. What are the causes? Causes of this condition include:  Activities that repeatedly cause your thumb and wrist to extend.  A sudden increase in activity or change in activity that affects your wrist.  What increases the risk? This condition is more likely to develop in:  Females.  People who have diabetes.  Women who have recently given birth.  People who are over 63 years of age.  People who do activities that involve repeated hand and wrist motions, such as tennis, racquetball, volleyball, gardening, and taking care of children.  People who do heavy labor.  People who have poor wrist strength and flexibility.  People who do not warm up properly before activities.  What are the signs or symptoms? Symptoms of this condition include:  Pain or tenderness over the thumb side of the back of the wrist when your thumb and wrist are not moving.  Pain that gets worse when you straighten your thumb or extend your thumb or wrist.  Pain when the injured area is touched.  Locking or catching of the thumb joint while you bend and straighten your thumb.  Decreased thumb motion due to pain.  Swelling over the affected area.  How is this diagnosed? This condition is diagnosed with a medical history and physical exam. Your health care provider will ask for details about your injury and ask about your  symptoms. How is this treated? Treatment may include the use of icing and medicines to reduce pain and swelling. You may also be advised to wear a splint or brace to limit your thumb and wrist motion. In less severe cases, treatment may also include working with a physical therapist to strengthen your wrist and calm the irritation around your EPB tendon sheath. In severe cases, surgery may be needed. Follow these instructions at home: If you have a splint or brace:  Wear it as told by your health care provider. Remove it only as told by your health care provider.  Loosen the splint or brace if your fingers become numb and tingle, or if they turn cold and blue.  Keep the splint or brace clean and dry. Managing pain, stiffness, and swelling  If directed, apply ice to the injured area. ? Put ice in a plastic bag. ? Place a towel between your skin and the bag. ? Leave the ice on for 20 minutes, 2-3 times per day.  Move your fingers often to avoid stiffness and to lessen swelling.  Raise (elevate) the injured area above the level of your heart while you are sitting or lying down. General instructions  Return to your normal activities as told by your health care provider. Ask your health care provider what activities are safe for you.  Take over-the-counter and prescription medicines only as told by your health care provider.  Keep all follow-up visits as told by your health care provider. This is important.  Do not drive or operate heavy machinery while taking  prescription pain medicine. Contact a health care provider if:  Your pain, tenderness, or swelling gets worse, even if you have had treatment.  You have numbness or tingling in your wrist, hand, or fingers on the injured side. This information is not intended to replace advice given to you by your health care provider. Make sure you discuss any questions you have with your health care provider. Document Released: 03/13/2005  Document Revised: 08/19/2015 Document Reviewed: 05/19/2014 Elsevier Interactive Patient Education  2018 [employer].   Use naproxen every 12 hours for 5-7 days (with food). Keep splint on hand during all waking hours, can remove for sleep and showers.   Wear splint for 2 weeks, then try to slowly return to normal activity, may need to extend to 4 weeks of splint and/or refer to have injection if not improving.

## 2016-10-31 NOTE — Progress Notes (Signed)
Laura Steele , 1975/12/12, 41 y.o., female MRN: [id number] Patient Care Team    Relationship Specialty Notifications Start End  Ma Hillock, DO PCP - General Family Medicine  10/22/15     Chief Complaint  Patient presents with  . thumb pain    left     Subjective: Pt presents for an OV with complaints of Left thumb pain of 4 weeks duration.  Associated symptoms include pain with grasping and moving/range of motion of left thumb. She points to the proximal first thumb joint has location discomfort. Pain does not radiate. She has tried nothing for the discomfort, with the exception of ice. She denies any trauma. She reports that she had a similar presentation a few years ago with her right thumb. She does use repetitive motion during her work. She does not have a history of arthritis.  Depression screen [facility] 2/9 06/09/2016 10/22/2015  Decreased Interest 1 0  Down, Depressed, Hopeless 0 2  PHQ - 2 Score 1 2  Altered sleeping 1 3  Tired, decreased energy 2 2  Change in appetite 0 3  Feeling bad or failure about yourself  0 1  Trouble concentrating 0 0  Moving slowly or fidgety/restless 1 2  Suicidal thoughts 0 0  PHQ-9 Score 5 13    No Known Allergies Social History  Substance Use Topics  . Smoking status: Current Every Day Smoker    Packs/day: 0.50    Types: Cigarettes  . Smokeless tobacco: Never Used  . Alcohol use Yes     Comment: Pt states was drinkning 40 0z of beer a night. but stopped since sunday.   Past Medical History:  Diagnosis Date  . Allergy   . Anxiety   . Diabetes mellitus without complication [facility])    Past Surgical History:  Procedure Laterality Date  . ABDOMINAL HYSTERECTOMY     Family History  Problem Relation Age of Onset  . Diabetes Mother   . Breast cancer Mother   . Skin cancer Maternal Grandfather    Allergies as of 10/31/2016   No Known Allergies     Medication List       Accurate as of 10/31/16  8:55 AM. Always use your most  recent med list.          escitalopram 20 MG tablet Commonly known as:  LEXAPRO Take 1 tablet (20 mg total) by mouth daily.   glucose 4 GM chewable tablet Chew 1 tablet (4 g total) by mouth once as needed for low blood sugar.       All past medical history, surgical history, allergies, family history, immunizations andmedications were updated in the EMR today and reviewed under the history and medication portions of their EMR.     ROS: Negative, with the exception of above mentioned in HPI   Objective:  BP 132/85 (BP Location: Left Arm, Patient Position: Sitting, Cuff Size: Normal)   Pulse 69   Temp 98.1 F (36.7 C)   Resp 20   Ht 5\' 3"  (1.6 m)   Wt 134 lb 8 oz (61 kg)   SpO2 97%   BMI 23.83 kg/m  Body mass index is 23.83 kg/m. Gen: Afebrile. No acute distress. Nontoxic in appearance, well developed, well nourished.  MSK: No erythema, no soft tissue swelling. Tenderness to the first proximal thumb joint to palpation. Range of motion is normal, but uncomfortable. Positive Finkelstein. Normal thumb to finger range of motion with discomfort. Neurovascular intact distally.  No  exam data present No results found. No results found for this or any previous visit (from the past 24 hour(s)).  Assessment/Plan: Laura Steele is a 41 y.o. female present for OV for  De Quervain's tenosynovitis, left - Rest, NSAIDs, thumb spica splint. May return to work with full duty as long as wearing splint and can complete her normal job tasks. - She was prescribed naproxen 500 mg twice a day with a meal to take scheduled for the first 5-7 days, and then as needed only. - Wrist/thumb spica splint provided to patient today. She was advised to wear this during all waking hours for 2 weeks, and then try to slowly return to activity. - Start/described naproxen (NAPROSYN) 500 MG tablet; Take 1 tablet (500 mg total) by mouth 2 (two) times daily with a meal.  Dispense: 30 tablet; Refill: 0 -  Follow-up in 4 weeks if no improvement, sooner if worsening and can consider referral to have joint injection.   Reviewed expectations re: course of current medical issues.  Discussed self-management of symptoms.  Outlined signs and symptoms indicating need for more acute intervention.  Patient verbalized understanding and all questions were answered.  Patient received an After-Visit Summary.    No orders of the defined types were placed in this encounter.    Note is dictated utilizing voice recognition software. Although note has been proof read prior to signing, occasional typographical errors still can be missed. If any questions arise, please do not hesitate to call for verification.   electronically signed by:  Howard Pouch, DO  [place]

## 2016-11-02 ENCOUNTER — Encounter: Payer: Self-pay | Admitting: Family Medicine

## 2016-11-20 ENCOUNTER — Ambulatory Visit (INDEPENDENT_AMBULATORY_CARE_PROVIDER_SITE_OTHER): Payer: 59 | Admitting: Family Medicine

## 2016-11-20 ENCOUNTER — Encounter: Payer: Self-pay | Admitting: Family Medicine

## 2016-11-20 ENCOUNTER — Other Ambulatory Visit: Payer: Self-pay | Admitting: *Deleted

## 2016-11-20 ENCOUNTER — Other Ambulatory Visit: Payer: Self-pay | Admitting: Family Medicine

## 2016-11-20 VITALS — BP 121/81 | HR 95 | Temp 98.0°F | Resp 20 | Ht 63.0 in | Wt 132.0 lb

## 2016-11-20 DIAGNOSIS — L74510 Primary focal hyperhidrosis, axilla: Secondary | ICD-10-CM

## 2016-11-20 DIAGNOSIS — F518 Other sleep disorders not due to a substance or known physiological condition: Secondary | ICD-10-CM | POA: Diagnosis not present

## 2016-11-20 DIAGNOSIS — F419 Anxiety disorder, unspecified: Secondary | ICD-10-CM

## 2016-11-20 MED ORDER — HYDROXYZINE PAMOATE 25 MG PO CAPS: 25.0000 mg | ORAL_CAPSULE | Freq: Every day | ORAL | 5 refills | Status: DC

## 2016-11-20 MED ORDER — ESCITALOPRAM OXALATE 20 MG PO TABS: 20.0000 mg | ORAL_TABLET | Freq: Every day | ORAL | 1 refills | Status: DC

## 2016-11-20 NOTE — Progress Notes (Signed)
Laura Steele , 08-06-1975, 41 y.o., female MRN: [id number] Patient Care Team    Relationship Specialty Notifications Start End  Laura Hillock, DO PCP - General Family Medicine  10/22/15     Chief Complaint  Patient presents with  . Excessive Sweating  . Anxiety     Subjective: Pt presents for an OV with complaints of Excessive sweating and increase in anxiety.    Excessive sweating: Patient states she has had excessive sweating under her armpits for many years. There is an odor that is associated with the excessive sweating. She states the sweating can occur at any time during the day or night. She finds this embarrassing and wonders if there is anything that can be done. Patient is on an SSRI, however this was started within the last year, and sweating predates the start of this medication. She states she's tried every type of deodorant and nothing seems to be effective.  Abnormal dreams: Patient reports she has been having abnormal dreams 4 2-3 weeks. She states the dreams are very vivid, she wakes up thinking they were real. All of her dreams are about her dog. She states some of them are adults and danger, the dog is missing etc. She is uncertain why she has anxiety surrounding leaving her dog. She also has concerns while awake on leaving the dog alone, and the care for her dog. Her dog is 83-year-old and healthy. She reports she will not even go on vacation, and unless she is able to take her dog with her. She reports she is unable to fall asleep secondary to the stress/anxiety created surrounding her abnormal dreams.   Depression screen [facility] 2/9 06/09/2016 10/22/2015  Decreased Interest 1 0  Down, Depressed, Hopeless 0 2  PHQ - 2 Score 1 2  Altered sleeping 1 3  Tired, decreased energy 2 2  Change in appetite 0 3  Feeling bad or failure about yourself  0 1  Trouble concentrating 0 0  Moving slowly or fidgety/restless 1 2  Suicidal thoughts 0 0  PHQ-9 Score 5 13    No  Known Allergies Social History  Substance Use Topics  . Smoking status: Current Every Day Smoker    Packs/day: 0.50    Types: Cigarettes  . Smokeless tobacco: Never Used  . Alcohol use Yes     Comment: Pt states was drinkning 40 0z of beer a night. but stopped since sunday.   Past Medical History:  Diagnosis Date  . Allergy   . Anxiety   . Diabetes mellitus without complication [facility])    Past Surgical History:  Procedure Laterality Date  . ABDOMINAL HYSTERECTOMY     Family History  Problem Relation Age of Onset  . Diabetes Mother   . Breast cancer Mother   . Skin cancer Maternal Grandfather    Allergies as of 11/20/2016   No Known Allergies     Medication List       Accurate as of 11/20/16 11:59 PM. Always use your most recent med list.          escitalopram 20 MG tablet Commonly known as:  LEXAPRO Take 1 tablet (20 mg total) by mouth daily.   glucose 4 GM chewable tablet Chew 1 tablet (4 g total) by mouth once as needed for low blood sugar.   hydrOXYzine 25 MG capsule Commonly known as:  VISTARIL Take 1-2 capsules (25-50 mg total) by mouth at bedtime.   naproxen 500 MG tablet Commonly known  as:  NAPROSYN Take 1 tablet (500 mg total) by mouth 2 (two) times daily with a meal.            Discharge Care Instructions        Start     Ordered   11/20/16 0000  hydrOXYzine (VISTARIL) 25 MG capsule  Daily at bedtime     11/20/16 3419      All past medical history, surgical history, allergies, family history, immunizations andmedications were updated in the EMR today and reviewed under the history and medication portions of their EMR.     ROS: Negative, with the exception of above mentioned in HPI   Objective:  BP 121/81 (BP Location: Right Arm, Patient Position: Sitting, Cuff Size: Normal)   Pulse 95   Temp 98 F (36.7 C)   Resp 20   Ht 5\' 3"  (1.6 m)   Wt 132 lb (59.9 kg)   SpO2 100%   BMI 23.38 kg/m  Body mass index is 23.38 kg/m. Gen:  Afebrile. No acute distress. Nontoxic in appearance, well developed, well nourished.  HENT: AT. [place]. . MMM, no oral lesions. Eyes:Pupils Equal Round Reactive to light, Extraocular movements intact,  Conjunctiva without redness, discharge or icterus. Neck/lymp/endocrine: Supple, no lymphadenopathy CV: RRR, no edema Chest: CTAB, no wheeze or crackles.  Abd: Soft. NTND. BS present. Skin: no rashes, purpura or petechiae.  Neuro: Normal gait. PERLA. EOMi. Alert. Oriented x3  Psych: Normal affect, dress and demeanor. Normal speech. Normal thought content and judgment.  No exam data present No results found. No results found for this or any previous visit (from the past 24 hour(s)).  Assessment/Plan: Laura Steele is a 41 y.o. female present for OV for  Hyperhidrosis Chronic uncontrolled problem, new to this provider. Discussed different options with patient today including prescribed aluminum chloride hexahydrate deodorant/powder versus Robinul. This has been a chronic condition for her throughout her life, very embarrassing to her. However I do not want to start 2 new medications on her at the same time. Since she is more distressed today about her abnormal dreams, we have decided to treat that with Vistaril first. - She has a physical coming up within the next 1-2 weeks and which we can consider starting Robinul at that time if she desires.  Abnormal dreams - New problem. - could be lexapro (1-10% SE profile), but would be odd that it just started when she has been on this medication and dose for some time. Discussed options with her today and decided to try vistaril prior to bed. Consider all dreams are concerning her dog and leaving him alone, may be an underlying condition.    Reviewed expectations re: course of current medical issues.  Discussed self-management of symptoms.  Outlined signs and symptoms indicating need for more acute intervention.  Patient verbalized understanding  and all questions were answered.  Patient received an After-Visit Summary.    No orders of the defined types were placed in this encounter.    Note is dictated utilizing voice recognition software. Although note has been proof read prior to signing, occasional typographical errors still can be missed. If any questions arise, please do not hesitate to call for verification.   electronically signed by:  Howard Pouch, DO  [place]

## 2016-11-20 NOTE — Patient Instructions (Signed)
Start Vistaril 1-2 tabs at night. This should help with the anxiety surrounding sleep/dog.    We will start new med for sweating after we make sure no side effects to vistaril.

## 2016-11-21 DIAGNOSIS — L74510 Primary focal hyperhidrosis, axilla: Secondary | ICD-10-CM | POA: Insufficient documentation

## 2016-11-21 DIAGNOSIS — F518 Other sleep disorders not due to a substance or known physiological condition: Secondary | ICD-10-CM | POA: Insufficient documentation

## 2016-12-04 ENCOUNTER — Encounter: Payer: Self-pay | Admitting: Family Medicine

## 2016-12-04 ENCOUNTER — Ambulatory Visit (INDEPENDENT_AMBULATORY_CARE_PROVIDER_SITE_OTHER): Payer: 59 | Admitting: Family Medicine

## 2016-12-04 VITALS — BP 114/77 | HR 78 | Temp 98.2°F | Resp 20 | Ht 63.0 in | Wt 135.5 lb

## 2016-12-04 DIAGNOSIS — F419 Anxiety disorder, unspecified: Secondary | ICD-10-CM

## 2016-12-04 DIAGNOSIS — Z0000 Encounter for general adult medical examination without abnormal findings: Secondary | ICD-10-CM

## 2016-12-04 DIAGNOSIS — Z23 Encounter for immunization: Secondary | ICD-10-CM | POA: Diagnosis not present

## 2016-12-04 DIAGNOSIS — Z13220 Encounter for screening for lipoid disorders: Secondary | ICD-10-CM

## 2016-12-04 DIAGNOSIS — M654 Radial styloid tenosynovitis [de Quervain]: Secondary | ICD-10-CM

## 2016-12-04 DIAGNOSIS — Z1231 Encounter for screening mammogram for malignant neoplasm of breast: Secondary | ICD-10-CM

## 2016-12-04 DIAGNOSIS — Z131 Encounter for screening for diabetes mellitus: Secondary | ICD-10-CM

## 2016-12-04 DIAGNOSIS — Z130 Encounter for screening for diseases of the blood and blood-forming organs and certain disorders involving the immune mechanism: Secondary | ICD-10-CM

## 2016-12-04 DIAGNOSIS — M79602 Pain in left arm: Secondary | ICD-10-CM

## 2016-12-04 DIAGNOSIS — L74510 Primary focal hyperhidrosis, axilla: Secondary | ICD-10-CM | POA: Diagnosis not present

## 2016-12-04 DIAGNOSIS — Z1239 Encounter for other screening for malignant neoplasm of breast: Secondary | ICD-10-CM

## 2016-12-04 LAB — COMPREHENSIVE METABOLIC PANEL
ALBUMIN: 3.9 g/dL (ref 3.5–5.2)
ALK PHOS: 63 U/L (ref 39–117)
ALT: 18 U/L (ref 0–35)
AST: 23 U/L (ref 0–37)
BUN: 5 mg/dL — AB (ref 6–23)
CO2: 27 mEq/L (ref 19–32)
CREATININE: 0.56 mg/dL (ref 0.40–1.20)
Calcium: 9.1 mg/dL (ref 8.4–10.5)
Chloride: 103 mEq/L (ref 96–112)
GFR: 127.25 mL/min (ref >60.00)
Glucose, Bld: 82 mg/dL (ref 70–99)
POTASSIUM: 4.7 meq/L (ref 3.5–5.1)
SODIUM: 139 meq/L (ref 135–145)
TOTAL PROTEIN: 6 g/dL (ref 6.0–8.3)
Total Bilirubin: 0.5 mg/dL (ref 0.2–1.2)

## 2016-12-04 LAB — LIPID PANEL
CHOLESTEROL: 204 mg/dL — AB (ref 0–200)
HDL: 61.4 mg/dL (ref >39.00)
LDL Cholesterol: 113 mg/dL — ABNORMAL HIGH (ref 0–99)
NonHDL: 142.84
Total CHOL/HDL Ratio: 3
Triglycerides: 149 mg/dL (ref 0.0–149.0)
VLDL: 29.8 mg/dL (ref 0.0–40.0)

## 2016-12-04 LAB — CBC WITH DIFFERENTIAL/PLATELET
Basophils Absolute: 0.1 10*3/uL (ref 0.0–0.1)
Basophils Relative: 1.4 % (ref 0.0–3.0)
EOS PCT: 3.6 % (ref 0.0–5.0)
Eosinophils Absolute: 0.4 10*3/uL (ref 0.0–0.7)
HEMATOCRIT: 47.9 % — AB (ref 36.0–46.0)
HEMOGLOBIN: 15.8 g/dL — AB (ref 12.0–15.0)
LYMPHS PCT: 26.5 % (ref 12.0–46.0)
Lymphs Abs: 2.7 10*3/uL (ref 0.7–4.0)
MCHC: 33.1 g/dL (ref 30.0–36.0)
MCV: 102.7 fl — AB (ref 78.0–100.0)
MONO ABS: 0.9 10*3/uL (ref 0.1–1.0)
MONOS PCT: 9.2 % (ref 3.0–12.0)
Neutro Abs: 6.1 10*3/uL (ref 1.4–7.7)
Neutrophils Relative %: 59.3 % (ref 43.0–77.0)
Platelets: 243 10*3/uL (ref 150.0–400.0)
RBC: 4.66 Mil/uL (ref 3.87–5.11)
RDW: 13 % (ref 11.5–15.5)
WBC: 10.3 10*3/uL (ref 4.0–10.5)

## 2016-12-04 LAB — TSH: TSH: 4.02 u[IU]/mL (ref 0.35–4.50)

## 2016-12-04 LAB — HEMOGLOBIN A1C: HEMOGLOBIN A1C: 5.1 % (ref 4.6–6.5)

## 2016-12-04 MED ORDER — GLYCOPYRROLATE 1 MG PO TABS: 1.0000 mg | ORAL_TABLET | Freq: Every day | ORAL | 0 refills | Status: DC

## 2016-12-04 MED ORDER — NAPROXEN 500 MG PO TABS: 500.0000 mg | ORAL_TABLET | Freq: Two times a day (BID) | ORAL | 0 refills | Status: DC

## 2016-12-04 MED ORDER — ESCITALOPRAM OXALATE 20 MG PO TABS: 20.0000 mg | ORAL_TABLET | Freq: Every day | ORAL | 1 refills | Status: DC

## 2016-12-04 NOTE — Patient Instructions (Signed)
It was great to see you today.  I referred you to [facility], and refilled all meds for you today. We will call with results of labs once available.    Health Maintenance, Female Adopting a healthy lifestyle and getting preventive care can go a long way to promote health and wellness. Talk with your health care provider about what schedule of regular examinations is right for you. This is a good chance for you to check in with your provider about disease prevention and staying healthy. In between checkups, there are plenty of things you can do on your own. Experts have done a lot of research about which lifestyle changes and preventive measures are most likely to keep you healthy. Ask your health care provider for more information. Weight and diet Eat a healthy diet  Be sure to include plenty of vegetables, fruits, low-fat dairy products, and lean protein.  Do not eat a lot of foods high in solid fats, added sugars, or salt.  Get regular exercise. This is one of the most important things you can do for your health. ? Most adults should exercise for at least 150 minutes each week. The exercise should increase your heart rate and make you sweat (moderate-intensity exercise). ? Most adults should also do strengthening exercises at least twice a week. This is in addition to the moderate-intensity exercise.  Maintain a healthy weight  Body mass index (BMI) is a measurement that can be used to identify possible weight problems. It estimates body fat based on height and weight. Your health care provider can help determine your BMI and help you achieve or maintain a healthy weight.  For females 74 years of age and older: ? A BMI below 18.5 is considered underweight. ? A BMI of 18.5 to 24.9 is normal. ? A BMI of 25 to 29.9 is considered overweight. ? A BMI of 30 and above is considered obese.  Watch levels of cholesterol and blood lipids  You should start having your blood tested for lipids and  cholesterol at 40 years of age, then have this test every 5 years.  You may need to have your cholesterol levels checked more often if: ? Your lipid or cholesterol levels are high. ? You are older than 40 years of age. ? You are at high risk for heart disease.  Cancer screening Lung Cancer  Lung cancer screening is recommended for adults 40-16 years old who are at high risk for lung cancer because of a history of smoking.  A yearly low-dose CT scan of the lungs is recommended for people who: ? Currently smoke. ? Have quit within the past 15 years. ? Have at least a 30-pack-year history of smoking. A pack year is smoking an average of one pack of cigarettes a day for 1 year.  Yearly screening should continue until it has been 15 years since you quit.  Yearly screening should stop if you develop a health problem that would prevent you from having lung cancer treatment.  Breast Cancer  Practice breast self-awareness. This means understanding how your breasts normally appear and feel.  It also means doing regular breast self-exams. Let your health care provider know about any changes, no matter how small.  If you are in your 20s or 30s, you should have a clinical breast exam (CBE) by a health care provider every 1-3 years as part of a regular health exam.  If you are 17 or older, have a CBE every year. Also consider  having a breast X-ray (mammogram) every year.  If you have a family history of breast cancer, talk to your health care provider about genetic screening.  If you are at high risk for breast cancer, talk to your health care provider about having an MRI and a mammogram every year.  Breast cancer gene (BRCA) assessment is recommended for women who have family members with BRCA-related cancers. BRCA-related cancers include: ? Breast. ? Ovarian. ? Tubal. ? Peritoneal cancers.  Results of the assessment will determine the need for genetic counseling and BRCA1 and BRCA2  testing.  Cervical Cancer Your health care provider may recommend that you be screened regularly for cancer of the pelvic organs (ovaries, uterus, and vagina). This screening involves a pelvic examination, including checking for microscopic changes to the surface of your cervix (Pap test). You may be encouraged to have this screening done every 3 years, beginning at age 40.  For women ages 40-65, health care providers may recommend pelvic exams and Pap testing every 3 years, or they may recommend the Pap and pelvic exam, combined with testing for human papilloma virus (HPV), every 5 years. Some types of HPV increase your risk of cervical cancer. Testing for HPV may also be done on women of any age with unclear Pap test results.  Other health care providers may not recommend any screening for nonpregnant women who are considered low risk for pelvic cancer and who do not have symptoms. Ask your health care provider if a screening pelvic exam is right for you.  If you have had past treatment for cervical cancer or a condition that could lead to cancer, you need Pap tests and screening for cancer for at least 20 years after your treatment. If Pap tests have been discontinued, your risk factors (such as having a new sexual partner) need to be reassessed to determine if screening should resume. Some women have medical problems that increase the chance of getting cervical cancer. In these cases, your health care provider may recommend more frequent screening and Pap tests.  Colorectal Cancer  This type of cancer can be detected and often prevented.  Routine colorectal cancer screening usually begins at 40 years of age and continues through 40 years of age.  Your health care provider may recommend screening at an earlier age if you have risk factors for colon cancer.  Your health care provider may also recommend using home test kits to check for hidden blood in the stool.  A small camera at the end of a  tube can be used to examine your colon directly (sigmoidoscopy or colonoscopy). This is done to check for the earliest forms of colorectal cancer.  Routine screening usually begins at age 40.  Direct examination of the colon should be repeated every 5-10 years through 40 years of age. However, you may need to be screened more often if early forms of precancerous polyps or small growths are found.  Skin Cancer  Check your skin from head to toe regularly.  Tell your health care provider about any new moles or changes in moles, especially if there is a change in a mole's shape or color.  Also tell your health care provider if you have a mole that is larger than the size of a pencil eraser.  Always use sunscreen. Apply sunscreen liberally and repeatedly throughout the day.  Protect yourself by wearing long sleeves, pants, a wide-brimmed hat, and sunglasses whenever you are outside.  Heart disease, diabetes, and high blood pressure  High blood pressure causes heart disease and increases the risk of stroke. High blood pressure is more likely to develop in: ? People who have blood pressure in the high end of the normal range (130-139/85-89 mm Hg). ? People who are overweight or obese. ? People who are African American.  If you are 97-43 years of age, have your blood pressure checked every 3-5 years. If you are 34 years of age or older, have your blood pressure checked every year. You should have your blood pressure measured twice-once when you are at a hospital or clinic, and once when you are not at a hospital or clinic. Record the average of the two measurements. To check your blood pressure when you are not at a hospital or clinic, you can use: ? An automated blood pressure machine at a pharmacy. ? A home blood pressure monitor.  If you are between 39 years and 85 years old, ask your health care provider if you should take aspirin to prevent strokes.  Have regular diabetes screenings. This  involves taking a blood sample to check your fasting blood sugar level. ? If you are at a normal weight and have a low risk for diabetes, have this test once every three years after 40 years of age. ? If you are overweight and have a high risk for diabetes, consider being tested at a younger age or more often. Preventing infection Hepatitis B  If you have a higher risk for hepatitis B, you should be screened for this virus. You are considered at high risk for hepatitis B if: ? You were born in a country where hepatitis B is common. Ask your health care provider which countries are considered high risk. ? Your parents were born in a high-risk country, and you have not been immunized against hepatitis B (hepatitis B vaccine). ? You have HIV or AIDS. ? You use needles to inject street drugs. ? You live with someone who has hepatitis B. ? You have had sex with someone who has hepatitis B. ? You get hemodialysis treatment. ? You take certain medicines for conditions, including cancer, organ transplantation, and autoimmune conditions.  Hepatitis C  Blood testing is recommended for: ? Everyone born from [birth year] through 1964. ? Anyone with known risk factors for hepatitis C.  Sexually transmitted infections (STIs)  You should be screened for sexually transmitted infections (STIs) including gonorrhea and chlamydia if: ? You are sexually active and are younger than 40 years of age. ? You are older than 40 years of age and your health care provider tells you that you are at risk for this type of infection. ? Your sexual activity has changed since you were last screened and you are at an increased risk for chlamydia or gonorrhea. Ask your health care provider if you are at risk.  If you do not have HIV, but are at risk, it may be recommended that you take a prescription medicine daily to prevent HIV infection. This is called pre-exposure prophylaxis (PrEP). You are considered at risk if: ? You are  sexually active and do not regularly use condoms or know the HIV status of your partner(s). ? You take drugs by injection. ? You are sexually active with a partner who has HIV.  Talk with your health care provider about whether you are at high risk of being infected with HIV. If you choose to begin PrEP, you should first be tested for HIV. You should then be tested every 3 months  for as long as you are taking PrEP. Pregnancy  If you are premenopausal and you may become pregnant, ask your health care provider about preconception counseling.  If you may become pregnant, take 400 to 800 micrograms (mcg) of folic acid every day.  If you want to prevent pregnancy, talk to your health care provider about birth control (contraception). Osteoporosis and menopause  Osteoporosis is a disease in which the bones lose minerals and strength with aging. This can result in serious bone fractures. Your risk for osteoporosis can be identified using a bone density scan.  If you are 29 years of age or older, or if you are at risk for osteoporosis and fractures, ask your health care provider if you should be screened.  Ask your health care provider whether you should take a calcium or vitamin D supplement to lower your risk for osteoporosis.  Menopause may have certain physical symptoms and risks.  Hormone replacement therapy may reduce some of these symptoms and risks. Talk to your health care provider about whether hormone replacement therapy is right for you. Follow these instructions at home:  Schedule regular health, dental, and eye exams.  Stay current with your immunizations.  Do not use any tobacco products including cigarettes, chewing tobacco, or electronic cigarettes.  If you are pregnant, do not drink alcohol.  If you are breastfeeding, limit how much and how often you drink alcohol.  Limit alcohol intake to no more than 1 drink per day for nonpregnant women. One drink equals 12 ounces of  beer, 5 ounces of wine, or 1 ounces of hard liquor.  Do not use street drugs.  Do not share needles.  Ask your health care provider for help if you need support or information about quitting drugs.  Tell your health care provider if you often feel depressed.  Tell your health care provider if you have ever been abused or do not feel safe at home. This information is not intended to replace advice given to you by your health care provider. Make sure you discuss any questions you have with your health care provider. Document Released: 09/26/2010 Document Revised: 08/19/2015 Document Reviewed: 12/15/2014 Elsevier Interactive Patient Education  [employer].

## 2016-12-04 NOTE — Progress Notes (Signed)
Patient ID: Laura Steele, female  DOB: 1975-06-01, 41 y.o.   MRN: [id number] Patient Care Team    Relationship Specialty Notifications Start End  Ma Hillock, DO PCP - General Family Medicine  10/22/15     Chief Complaint  Patient presents with  . Annual Exam    Subjective:  Laura Steele is a 41 y.o.  Female  present for CPE. All past medical history, surgical history, allergies, family history, immunizations, medications and social history were updated in the electronic medical record today. All recent labs, ED visits and hospitalizations within the last year were reviewed.   Anxiety Pt reports she is doing well on lexapro and vistaril. Vistaril was added a few weeks ago to help with the insomnia and she states she is sleeping much better and not having any nightmares, except one.   Hyperhidrosis of axilla Long term history. Discussed in detail last appt. Pt is agreeable to medication start today. Did not want to start 2 meds at the same time last visit.    Left arm pain/Tenosynovitis, de Quervain Now greater than 4 weeks, will no therapeutic response to NSAIDS, rest and wrist brace. Pt is agreeable to referral today to consider further eval and potential injection.   Health maintenance:  Colonoscopy: no fhx, screen at 50 Mammogram: ordered  mammogram today. Completes SBE. Immunizations: tdap administered today, Influenza administered today (encouraged yearly), PNA series series completed today for smoking history Infectious disease screening: HIV declined DEXA: N/A Assistive device: none (currently in arm brace) Oxygen KJZ:PHXT Patient has a Dental home. Hospitalizations/ED visits: reviewed   Depression screen [facility] 2/9 12/04/2016 06/09/2016 10/22/2015  Decreased Interest - 1 0  Down, Depressed, Hopeless 0 0 2  PHQ - 2 Score 0 1 2  Altered sleeping 0 1 3  Tired, decreased energy 0 2 2  Change in appetite 0 0 3  Feeling bad or failure about yourself  0 0 1    Trouble concentrating 0 0 0  Moving slowly or fidgety/restless 0 1 2  Suicidal thoughts 0 0 0  PHQ-9 Score 0 5 13   GAD 7 : Generalized Anxiety Score 11/20/2016 06/09/2016 10/22/2015  Nervous, Anxious, on Edge _0 Control/stop worrying _1 Worry too much - different things _2 Trouble relaxing _3 Restless _4 Easily annoyed or irritable _5 Afraid - awful might happen _6 Total GAD 7 Score _7 Anxiety Difficulty Very difficult Very difficult Very difficult     Current Exercise Habits: The patient has a physically strenous job, but has no regular exercise apart from work. Exercise limited by: None identified   Immunization History  Administered Date(s) Administered  . Hepatitis B, adult 03/24/2014, 04/24/2014, 09/23/2014  . Influenza,inj,Quad PF,6+ Mos 12/17/2015, 12/04/2016  . Pneumococcal Conjugate-13 10/22/2015  . Pneumococcal Polysaccharide-23 12/04/2016  . Tdap 12/04/2016     Past Medical History:  Diagnosis Date  . Allergy   . Anxiety   . Hypoglycemia    No Known Allergies Past Surgical History:  Procedure Laterality Date  . ABDOMINAL HYSTERECTOMY     Family History  Problem Relation Age of Onset  . Diabetes Mother   . Breast cancer Mother        had masectomy.   . Skin cancer Maternal Grandfather    Social History   Social History  . Marital status: Single  Spouse name: N/A  . Number of children: N/A  . Years of education: N/A   Occupational History  . Not on file.   Social History Main Topics  . Smoking status: Current Every Day Smoker    Packs/day: 0.50    Types: Cigarettes  . Smokeless tobacco: Never Used  . Alcohol use Yes     Comment: Pt states was drinkning 40 0z of beer a night. but stopped since _0 /10/18 1027      All past medical history,  surgical history, allergies, family history, immunizations andmedications were updated in the EMR today and reviewed under the history and medication portions of their EMR.     No results found for this or any previous visit (from the past 2160 hour(s)).  Dg Wrist Complete Left  Result Date: 01/26/2016 CLINICAL DATA:  Left wrist pain.  Fall. EXAM: LEFT WRIST - COMPLETE 3+ VIEW COMPARISON:  No recent. FINDINGS: No acute bony or joint abnormality identified. No evidence of fracture dislocation. IMPRESSION: No acute or focal abnormality. Electronically Signed   By: Marcello Moores  Register   On: 01/26/2016 16:32    ROS: 14 pt review of systems performed and negative (unless mentioned in an HPI)  Objective: BP 114/77 (BP Location: Right Arm, Patient Position: Sitting, Cuff Size: Normal)   Pulse 78   Temp 98.2 F (36.8 C)   Resp 20   Ht _0  (1.6 m)   Wt 135 lb 8 oz (61.5 kg)   SpO2 100%   BMI 24.00 kg/m  Gen: Afebrile. No acute distress. Nontoxic in appearance, well-developed, well-nourished,  pleasant caucasian female.  HENT: AT. [place]. Bilateral TM visualized and normal in appearance, normal external auditory canal. MMM, no oral lesions, adequate dentition. Bilateral nares within normal limits. Throat without erythema, ulcerations or exudates. no Cough on exam, no hoarseness on exam. Eyes:Pupils Equal Round Reactive to light, Extraocular movements intact,  Conjunctiva without redness, discharge or icterus. Neck/lymp/endocrine: Supple,no lymphadenopathy, no thyromegaly CV: RRR no murmur, no edema, +2/4 P posterior tibialis pulses. no carotid bruits. No JVD. Chest: CTAB, no wheeze, rhonchi or crackles. normal Respiratory effort. good Air movement. Abd: Soft. flat. NTND. BS present. no Masses palpated. No  hepatosplenomegaly. No rebound tenderness or guarding. Skin: no rashes, purpura or petechiae. Warm and well-perfused. Skin intact. Neuro/Msk:  Normal gait. PERLA. EOMi. Alert. Oriented x3.  Cranial nerves II through XII intact. Muscle strength 5/5 upper/lower extremity. DTRs equal bilaterally. Psych: Normal affect, dress and demeanor. Normal speech. Normal thought content and judgment.   No exam data present  Assessment/plan: Stella Bortle is a 41 y.o. female present for CPE. Encounter for preventive health examination Patient was encouraged to exercise greater than 150 minutes a week. Patient was encouraged to choose a diet filled with fresh fruits and vegetables, and lean meats. AVS provided to patient today for education/recommendation on gender specific health and safety maintenance. - CBC w/Diff - Comp Met (CMET)  Screening for iron deficiency anemia - CBC w/Diff Lipid screening - Lipid panel Anxiety - continue lexapro and vistaril, refills prescribed today.  - TSH - f/u 6 months.   Hyperhidrosis of axilla - discussed options, including prescribed deodorants and oral medications. Pt is agreeable to oral med today. . - glycopyrrolate (ROBINUL) 1 MG tablet; Take 1 tablet (1 mg total) by mouth daily.  Dispense: 30 tablet; Refill: 0 - f/u 4 weeks if not improved. Otherwise she can call in and 90d refills will be prescribed for 6 months.   Diabetes mellitus screening - HgB A1c Tenosynovitis, de Quervain/left arm pain - failed NSAID, REST, arm brace > 4 weeks.  - discussed referral to SM and pt is agreeable to this today.  - naproxen (NAPROSYN) 500 MG tablet; Take 1 tablet (500 mg total) by mouth 2 (two) times daily with a meal.  Dispense: 30 tablet; Refill: 0 - Ambulatory referral to Sports Medicine  Breast cancer screening - MM DIGITAL SCREENING BILATERAL; Future  Immunization due - Flu Vaccine QUAD 6+ mos PF IM (Fluarix Quad  PF) - Current smoker. Pneumococcal  polysaccharide vaccine 23-valent greater than or equal to 2yo subcutaneous/IM - Tdap vaccine greater than or equal to 7yo IM   Return in about 1 year (around 12/04/2017) for CPE.  Electronically signed by: Howard Pouch, DO [place]

## 2016-12-05 ENCOUNTER — Telehealth: Payer: Self-pay | Admitting: Family Medicine

## 2016-12-05 DIAGNOSIS — D7589 Other specified diseases of blood and blood-forming organs: Secondary | ICD-10-CM | POA: Insufficient documentation

## 2016-12-05 NOTE — Telephone Encounter (Signed)
Please call pt: - her labs are normal with the exception of part of her blood count shows mildly enlarged size in her blood cells. Most commonly this is from thyroid disorder, Vit B deficiency, folate deficiency or alcohol consumption. Her thyroid is functioning normal by the her lab results.  - Therefore, I recommend:     - Add a B complex or B12 vitamin to her daily regimen.      - also fortify her diet with folic acid either by extra supplement or foods (like cereals) fortified with folic acid.      - If she is consuming alcohol routinely, try  to cut back.      - We will follow up in this on her next appt in 6 months with repeat labs.

## 2016-12-05 NOTE — Telephone Encounter (Signed)
Spoke with patient reviewed results and instructions patient verbalized understanding.

## 2016-12-18 ENCOUNTER — Ambulatory Visit (INDEPENDENT_AMBULATORY_CARE_PROVIDER_SITE_OTHER): Payer: 59

## 2016-12-18 ENCOUNTER — Ambulatory Visit (INDEPENDENT_AMBULATORY_CARE_PROVIDER_SITE_OTHER): Payer: 59 | Admitting: Sports Medicine

## 2016-12-18 ENCOUNTER — Encounter: Payer: Self-pay | Admitting: Sports Medicine

## 2016-12-18 DIAGNOSIS — M50121 Cervical disc disorder at C4-C5 level with radiculopathy: Secondary | ICD-10-CM | POA: Diagnosis not present

## 2016-12-18 DIAGNOSIS — M5412 Radiculopathy, cervical region: Secondary | ICD-10-CM

## 2016-12-18 MED ORDER — PREDNISONE 50 MG PO TABS: ORAL_TABLET | ORAL | 0 refills | Status: DC

## 2016-12-18 MED ORDER — CYCLOBENZAPRINE HCL 10 MG PO TABS: ORAL_TABLET | ORAL | 0 refills | Status: DC

## 2016-12-18 MED ORDER — MELOXICAM 15 MG PO TABS: ORAL_TABLET | ORAL | 3 refills | Status: DC

## 2016-12-18 NOTE — Patient Instructions (Signed)
Cervical Radiculopathy Cervical radiculopathy happens when a nerve in the neck (cervical nerve) is pinched or bruised. This condition can develop because of an injury or as part of the normal aging process. Pressure on the cervical nerves can cause pain or numbness that runs from the neck all the way down into the arm and fingers. Usually, this condition gets better with rest. Treatment may be needed if the condition does not improve. What are the causes? This condition may be caused by:  Injury.  Slipped (herniated) disk.  Muscle tightness in the neck because of overuse.  Arthritis.  Breakdown or degeneration in the bones and joints of the spine (spondylosis) due to aging.  Bone spurs that may develop near the cervical nerves.  What are the signs or symptoms? Symptoms of this condition include:  Pain that runs from the neck to the arm and hand. The pain can be severe or irritating. It may be worse when the neck is moved.  Numbness or weakness in the affected arm and hand.  How is this diagnosed? This condition may be diagnosed based on symptoms, medical history, and a physical exam. You may also have tests, including:  X-rays.  CT scan.  MRI.  Electromyogram (EMG).  Nerve conduction tests.  How is this treated? In many cases, treatment is not needed for this condition. With rest, the condition usually gets better over time. If treatment is needed, options may include:  Wearing a soft neck collar for short periods of time.  Physical therapy to strengthen your neck muscles.  Medicines, such as NSAIDs, oral corticosteroids, or spinal injections.  Surgery. This may be needed if other treatments do not help. Various types of surgery may be done depending on the cause of your problems.  Follow these instructions at home: Managing pain  Take over-the-counter and prescription medicines only as told by your health care provider.  If directed, apply ice to the affected  area. ? Put ice in a plastic bag. ? Place a towel between your skin and the bag. ? Leave the ice on for 20 minutes, 2-3 times per day.  If ice does not help, you can try using heat. Take a warm shower or warm bath, or use a heat pack as told by your health care provider.  Try a gentle neck and shoulder massage to help relieve symptoms. Activity  Rest as needed. Follow instructions from your health care provider about any restrictions on activities.  Do stretching and strengthening exercises as told by your health care provider or physical therapist. General instructions  If you were given a soft collar, wear it as told by your health care provider.  Use a flat pillow when you sleep.  Keep all follow-up visits as told by your health care provider. This is important. Contact a health care provider if:  Your condition does not improve with treatment. Get help right away if:  Your pain gets much worse and cannot be controlled with medicines.  You have weakness or numbness in your hand, arm, face, or leg.  You have a high fever.  You have a stiff, rigid neck.  You lose control of your bowels or your bladder (have incontinence).  You have trouble with walking, balance, or speaking. This information is not intended to replace advice given to you by your health care provider. Make sure you discuss any questions you have with your health care provider. Document Released: 12/06/2000 Document Revised: 08/19/2015 Document Reviewed: 05/07/2014 Elsevier Interactive Patient Education    2018 Elsevier Inc.  

## 2016-12-18 NOTE — Progress Notes (Signed)
   Subjective:    I'm seeing this patient as a consultation for:  Dr. Kerry Hough  CC: Left shoulder, arm, hand pain  HPI: For the past several weeks this pleasant 41 year old female has had pain that initially started over the lateral wrist resembling de Quervain's tenosynovitis, she was treated appropriately but had persistence of symptoms. On further questioning she does have pain radiating from the act of the neck, down the upper arm, outer arm, with numbness and tingling in the second and third fingers, and occasionally into the thumb. Moderate, persistent, worse at night, difficulty sleeping. Not dropping things, no trauma, no constitutional symptoms.  Past medical history, Surgical history, Family history not pertinant except as noted below, Social history, Allergies, and medications have been entered into the medical record, reviewed, and no changes needed.   Review of Systems: No headache, visual changes, nausea, vomiting, diarrhea, constipation, dizziness, abdominal pain, skin rash, fevers, chills, night sweats, weight loss, swollen lymph nodes, body aches, joint swelling, muscle aches, chest pain, shortness of breath, mood changes, visual or auditory hallucinations.   Objective:   General: Well Developed, well nourished, and in no acute distress.  Neuro:  Extra-ocular muscles intact, able to move all 4 extremities, sensation grossly intact.  Deep tendon reflexes tested were normal. Psych: Alert and oriented, mood congruent with affect. ENT:  Ears and nose appear unremarkable.  Hearing grossly normal. Neck: Unremarkable overall appearance, trachea midline.  No visible thyroid enlargement. Eyes: Conjunctivae and lids appear unremarkable.  Pupils equal and round. Skin: Warm and dry, no rashes noted.  Cardiovascular: Pulses palpable, no extremity edema. Left Wrist: Inspection normal with no visible erythema or swelling. ROM smooth and normal with good flexion and extension and  ulnar/radial deviation that is symmetrical with opposite wrist. Palpation is normal over metacarpals, navicular, lunate, and TFCC; tendons without tenderness/ swelling No snuffbox tenderness. No tenderness over Canal of Guyon. Strength 5/5 in all directions without pain. Negative tinel's and phalens signs. Negative Finkelstein sign. Negative Watson's test. Neck: Negative spurling's Full neck range of motion Grip strength is okay, subjective hypoesthesia in a C7 distribution. Strength good C4 to T1 distribution No sensory change to C4 to T1 Reflexes normal with the exception of left triceps, slightly hypoactive. Negative Hoffmann sign bilaterally.  Impression and Recommendations:   This case required medical decision making of moderate complexity.  Radiculitis of left cervical region Left C7 distribution, possibly bit of C6 as well. Sensory only symptoms for now. Adding Flexeril at bedtime, 5 days of prednisone, x-rays, formal physical therapy. Meloxicam to be taken during the day when finishes with prednisone. Return to see me in one month, MRI for interventional planning if no better.  ___________________________________________ Gwen Her. Dianah Field, M.D., ABFM., CAQSM. Primary Care and [place] Instructor of [place] [facility] of Medicine

## 2016-12-18 NOTE — Assessment & Plan Note (Signed)
Left C7 distribution, possibly bit of C6 as well. Sensory only symptoms for now. Adding Flexeril at bedtime, 5 days of prednisone, x-rays, formal physical therapy. Meloxicam to be taken during the day when finishes with prednisone. Return to see me in one month, MRI for interventional planning if no better.

## 2016-12-25 ENCOUNTER — Ambulatory Visit (INDEPENDENT_AMBULATORY_CARE_PROVIDER_SITE_OTHER): Payer: 59 | Admitting: Physical Therapy

## 2016-12-25 ENCOUNTER — Encounter: Payer: Self-pay | Admitting: Physical Therapy

## 2016-12-25 DIAGNOSIS — M6281 Muscle weakness (generalized): Secondary | ICD-10-CM | POA: Diagnosis not present

## 2016-12-25 DIAGNOSIS — M5412 Radiculopathy, cervical region: Secondary | ICD-10-CM

## 2016-12-25 DIAGNOSIS — R29898 Other symptoms and signs involving the musculoskeletal system: Secondary | ICD-10-CM | POA: Diagnosis not present

## 2016-12-25 NOTE — Therapy (Signed)
[facility] [place], [place], [address] Phone: [phone]   Fax:  [phone]  Physical Therapy Evaluation  Patient Details  Name: Laura Steele MRN: [id number] Date of Birth: 1975-04-15 Referring Provider: Dr Dianah Field  Encounter Date: 12/25/2016      PT End of Session - 12/25/16 0805    Visit Number 1   Number of Visits 12   Date for PT Re-Evaluation 02/05/17   PT Start Time 0805   PT Stop Time 0845   PT Time Calculation (min) 40 min   Activity Tolerance Patient tolerated treatment well      Past Medical History:  Diagnosis Date  . Allergy   . Anxiety   . Hypoglycemia     Past Surgical History:  Procedure Laterality Date  . ABDOMINAL HYSTERECTOMY      There were no vitals filed for this visit.       Subjective Assessment - 12/25/16 0805    Subjective Pt reports she first had tendonitis in her Lt thumb, after x-rays it showed she is having cervical issues. this all started about 2 wks ago. She finished up prednisone and has no change.    Diagnostic tests x-ray C7 disc issues and DDD in spine.    Patient Stated Goals sleep better, waking about every hour. Get her strength and feeling back in her hand and move like she could.   Currently in Pain? Yes   Pain Score 8    Pain Location Neck   Pain Orientation Left   Pain Descriptors / Indicators Constant;Tightness   Pain Type Acute pain   Pain Radiating Towards into the Lt hand, tingling and numbness in second and third finger left hand.   Pain Onset 1 to 4 weeks ago   Pain Frequency Constant   Aggravating Factors  cold, moving   Pain Relieving Factors heat , holding arm overhead sometimes eases it            [facility] PT Assessment - 12/25/16 0001      Assessment   Medical Diagnosis Lt cervcial radiculitis   Referring Provider Dr Dianah Field   Onset Date/Surgical Date 12/11/16   Hand Dominance Right   Next MD Visit 01/21/17   Prior Therapy none     Precautions   Precautions None     Balance Screen   Has the patient fallen in the past 6 months No     Prior Function   Level of Independence Independent  with pain   Vocation Full time employment   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      Posture/Postural Control   Posture/Postural Control Postural limitations   Postural Limitations Rounded Shoulders;Forward head  head tilited to the Lt and slight rotation to right.    Posture Comments Ltshoulder complex elevated.      ROM / Strength   AROM / PROM / Strength AROM;Strength     AROM   AROM Assessment Site Shoulder;Cervical   Right/Left Shoulder --  WNL, however shooting pain into Lt interior arm.    Cervical Flexion 28   Cervical Extension 35   Cervical - Right Side Bend 12   Cervical - Left Side Bend 20   Cervical - Right Rotation 34   Cervical - Left Rotation 32  Strength   Strength Assessment Site Shoulder;Elbow;Hand   Right/Left Shoulder Left  Rt WNl   Left Shoulder Flexion 3/5   Left Shoulder Extension 4+/5   Left Shoulder ABduction 3+/5   Left Shoulder Internal Rotation 4/5   Left Shoulder External Rotation 4-/5   Right/Left Elbow Left  Rt WNl   Left Elbow Flexion 4+/5   Left Elbow Extension 3+/5   Right/Left hand --  Lt grip less than Rt not formally measured     Palpation   Palpation comment tightness in the Lt upper trap/levator and posterior muscles     Special Tests    Special Tests Cervical   Cervical Tests Spurling's     Spurling's   Findings Positive   Side Left            Objective measurements completed on examination: See above findings.          [place] Adult PT Treatment/Exercise - 12/25/16 0001      Exercises   Exercises Neck     Neck Exercises:  Standing   Neck Retraction 10 reps   Other Standing Exercises 10 reps scapular retraction, and shoulder rolls.      Modalities   Modalities Traction     Traction   Type of Traction Cervical   Min (lbs) 7   Max (lbs) 13   Hold Time 60   Rest Time 20   Time 15     Neck Exercises: Stretches   Other Neck Stretches low doorway stretch x 45 sec.                 PT Education - 12/25/16 0956    Education provided Yes   Education Details HEP   Person(s) Educated Patient   Methods Explanation;Demonstration;Handout   Comprehension Returned demonstration;Verbalized understanding             PT Long Term Goals - 12/25/16 0959      PT LONG TERM GOAL #1   Title I with advanced HEP ( 02/05/17)    Time 6   Period Weeks   Status New     PT LONG TERM GOAL #2   Title demo cervical ROM WNL and painfree ( 02/05/17)    Time 6   Period Weeks   Status New     PT LONG TERM GOAL #3   Title improve FOTO =/< 325 limited ( 02/05/17)    Time 6   Period Weeks   Status New     PT LONG TERM GOAL #4   Title demo Lt UE strength =/> Rt to allow her to return to her prior level of activity ( 02/05/17)    Time 6   Period Weeks   Status New     PT LONG TERM GOAL #5   Title report =/> 75% reduction of neck pain and hand numbness ( 02/05/17)    Time 6   Period Weeks   Status New                Plan - 12/25/16 0957    Clinical Impression Statement 41 yo female presents with 2 week h/o Lt cervical radiculapathy.  She has weakness in her Lt UE, pain with all movement of her neck and Lt UE.  Limited cervical motion, protective posturing and muscular tightness.    Clinical Presentation Evolving   Clinical Decision Making Low   Rehab Potential Excellent   PT Frequency 2x / week   PT Duration 6 weeks  PT Treatment/Interventions Moist Heat;Ultrasound;Traction;Therapeutic exercise;Dry needling;Taping;Manual techniques;Neuromuscular re-education;Cryotherapy;Electrical  Stimulation;Iontophoresis 4mg /ml Dexamethasone;Patient/family education;Passive range of motion   PT Next Visit Plan assess response to cervical traction, increase pull if tolerated well.  Manual work PROM to cervical spine and STM.  Progress scapular work.    Consulted and Agree with Plan of Care Patient      Patient will benefit from skilled therapeutic intervention in order to improve the following deficits and impairments:  Decreased range of motion, Increased muscle spasms, Impaired UE functional use, Pain, Hypomobility, Decreased strength, Postural dysfunction  Visit Diagnosis: Radiculopathy, cervical region - Plan: PT plan of care cert/re-cert  Muscle weakness (generalized) - Plan: PT plan of care cert/re-cert  Other symptoms and signs involving the musculoskeletal system - Plan: PT plan of care cert/re-cert     Problem List Patient Active Problem List   Diagnosis Date Noted  . Radiculitis of left cervical region 12/18/2016  . Macrocytosis without anemia 12/05/2016  . Abnormal dreams 11/21/2016  . 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 Anxiety 06/09/2016    Jeral Pinch PT  12/25/2016, 10:06 AM  [facility] [place], [place], [address] Phone: [phone]   Fax:  [phone]  Name: Laura Steele MRN: [id number] Date of Birth: 1975/08/09

## 2016-12-25 NOTE — Patient Instructions (Addendum)
Scapular Retraction (Standing)    With arms at sides, pinch shoulder blades together. Repeat _5-10___ times per set. Do __1__ sets per session. Do __multiple__ sessions per day.   Strengthening: Shoulder Shrug (Phase 1)    Shrug shoulders up and down, forward and backward. Repeat __5-10__ times per set. Do __1__ sets per session. Do _multiple___ sessions per day.  Flexibility: Neck Retraction    Pull head straight back, keeping eyes and jaw level. Repeat __5-10__ times per set. Do __1__ sets per session. Do _multiple___ sessions per day.  Scapula Adduction With Pectorals, Low   Stand in doorframe with palms against frame and arms at 45. Lean forward and squeeze shoulder blades. Hold _45__ seconds. Repeat __1_ times per session. Do _2-3__ sessions per day.

## 2016-12-29 ENCOUNTER — Other Ambulatory Visit: Payer: Self-pay | Admitting: Family Medicine

## 2016-12-29 ENCOUNTER — Encounter: Payer: Self-pay | Admitting: Physical Therapy

## 2016-12-29 ENCOUNTER — Telehealth: Payer: Self-pay | Admitting: Physical Therapy

## 2016-12-29 DIAGNOSIS — L74510 Primary focal hyperhidrosis, axilla: Secondary | ICD-10-CM

## 2016-12-29 MED ORDER — GLYCOPYRROLATE 1 MG PO TABS: 1.0000 mg | ORAL_TABLET | Freq: Every day | ORAL | 1 refills | Status: DC

## 2016-12-29 NOTE — Telephone Encounter (Signed)
Left message on private voice mail regarding [place] for physical therapy appt today.  Reminded pt of next upcoming appt and to please call 24 hr in advance if she is unable to make it.   Kerin Perna, PTA 12/29/16 1:25 PM

## 2017-01-02 ENCOUNTER — Ambulatory Visit (INDEPENDENT_AMBULATORY_CARE_PROVIDER_SITE_OTHER): Payer: 59 | Admitting: Rehabilitative and Restorative Service Providers"

## 2017-01-02 ENCOUNTER — Encounter: Payer: Self-pay | Admitting: Rehabilitative and Restorative Service Providers"

## 2017-01-02 DIAGNOSIS — R29898 Other symptoms and signs involving the musculoskeletal system: Secondary | ICD-10-CM

## 2017-01-02 DIAGNOSIS — M5412 Radiculopathy, cervical region: Secondary | ICD-10-CM

## 2017-01-02 DIAGNOSIS — M6281 Muscle weakness (generalized): Secondary | ICD-10-CM | POA: Diagnosis not present

## 2017-01-02 NOTE — Patient Instructions (Addendum)
Axial Extension (Chin Tuck)    Pull chin in and lengthen back of neck. Hold __5__ seconds while counting out loud. Repeat __10__ times. Do __several__ sessions per day.  Shoulder Blade Squeeze    Rotate shoulders back, then squeeze shoulder blades down and back Hold 10 sec Repeat __10__ times. Do _several___ sessions per day. Can use swim noodle to remind you of posture   Upper Back Strength: Lower Trapezius / Rotator Cuff " L's "     Arms in waitress pose, palms up. Press hands back and slide shoulder blades down. Hold for __5__ seconds. Repeat _10___ times. 1-2 times per day.    Scapular Retraction: Elbow Flexion (Standing)  "W's"     With elbows bent to 90, pinch shoulder blades together and rotate arms out, keeping elbows bent. Repeat __10__ times per set. Do __1-2__ sets per session. Do _several ___ sessions per day.  Trigger Point Dry Needling  . What is Trigger Point Dry Needling (DN)? o DN is a physical therapy technique used to treat muscle pain and dysfunction. Specifically, DN helps deactivate muscle trigger points (muscle knots).  o A thin filiform needle is used to penetrate the skin and stimulate the underlying trigger point. The goal is for a local twitch response (LTR) to occur and for the trigger point to relax. No medication of any kind is injected during the procedure.   . What Does Trigger Point Dry Needling Feel Like?  o The procedure feels different for each individual patient. Some patients report that they do not actually feel the needle enter the skin and overall the process is not painful. Very mild bleeding may occur. However, many patients feel a deep cramping in the muscle in which the needle was inserted. This is the local twitch response.   Marland Kitchen How Will I feel after the treatment? o Soreness is normal, and the onset of soreness may not occur for a few hours. Typically this soreness does not last longer than two days.  o Bruising is uncommon,  however; ice can be used to decrease any possible bruising.  o In rare cases feeling tired or nauseous after the treatment is normal. In addition, your symptoms may get worse before they get better, this period will typically not last longer than 24 hours.   . What Can I do After My Treatment? o Increase your hydration by drinking more water for the next 24 hours. o You may place ice or heat on the areas treated that have become sore, however, do not use heat on inflamed or bruised areas. Heat often brings more relief post needling. o You can continue your regular activities, but vigorous activity is not recommended initially after the treatment for 24 hours. o DN is best combined with other physical therapy such as strengthening, stretching, and other therapies.    [facility] [place] [address], [place] [address]  [phone] (office) [phone] (fax)

## 2017-01-02 NOTE — Therapy (Signed)
[place] [facility] [place], [place], [address] Phone: [phone]   Fax:  [phone]  Physical Therapy Treatment  Patient Details  Name: Laura Steele MRN: [id number] Date of Birth: 05-21-1975 Referring Provider: Dr Dianah Field   Encounter Date: 01/02/2017      PT End of Session - 01/02/17 0810    Visit Number 2   Number of Visits 12   Date for PT Re-Evaluation 02/05/17   PT Start Time 0805   PT Stop Time 3016   PT Time Calculation (min) 52 min   Activity Tolerance Patient tolerated treatment well      Past Medical History:  Diagnosis Date  . Allergy   . Anxiety   . Hypoglycemia     Past Surgical History:  Procedure Laterality Date  . ABDOMINAL HYSTERECTOMY      There were no vitals filed for this visit.      Subjective Assessment - 01/02/17 0811    Subjective Patient reports that she has less pain but continues to have numbness and tingling in the Lt UE. symptoms are increased with looking up or when cold "gets on" her neck.    Pain Score 5    Pain Location Neck   Pain Orientation Left   Pain Descriptors / Indicators Constant   Pain Type Acute pain   Pain Onset More than a month ago   Pain Frequency Constant            [facility] PT Assessment - 01/02/17 0001      Assessment   Medical Diagnosis Lt cervcial radiculitis   Referring Provider Dr Dianah Field    Onset Date/Surgical Date 12/11/16   Hand Dominance Right   Next MD Visit 01/21/17   Prior Therapy none     AROM   Cervical Flexion 30   Cervical Extension 35   Cervical - Right Side Bend 15   Cervical - Left Side Bend 22   Cervical - Right Rotation 36   Cervical - Left Rotation 34     Palpation   Palpation comment tightness in the Lt pec; upper trap/levator and anterior/lateral/posterior muscles                     [facility] Adult PT Treatment/Exercise - 01/02/17 0001      Neck Exercises: Standing   Neck  Retraction 10 reps   Other Standing Exercises 10 reps scapular retraction with noodle, and shoulder rolls.    Other Standing Exercises L's and W's x 10 each with noodle      Neck Exercises: Supine   Neck Retraction 5 reps;5 secs   Other Supine Exercise prolonged snow angel stretch ~ 1 min      Moist Heat Therapy   Number Minutes Moist Heat 20 Minutes   Moist Heat Location Cervical;Shoulder  Lt shd area      Electrical Stimulation   Electrical Stimulation Location bilat cervical Lt shoulder    Electrical Stimulation Action IFC   Electrical Stimulation Parameters to tolerance   Electrical Stimulation Goals Pain;Tone     Manual Therapy   Manual therapy comments pt supine    Joint Mobilization cervical and upper thoracic CPA and lateral glides    Soft tissue mobilization ant/lat/post cervical; upper trap; pecs; Lt > Rt    Myofascial Release Lt pecs/cervical    Manual Traction cervical traction throughout treatment ~ 4-5 reps 20-30 sec hold      Neck Exercises: Stretches   Other  Neck Stretches low and mid doorway 30 sec x 2 each                 PT Education - 01/02/17 0817    Education provided Yes   Education Details HEP DN   Person(s) Educated Patient   Methods Explanation;Demonstration;Tactile cues;Verbal cues;Handout   Comprehension Verbalized understanding;Returned demonstration;Verbal cues required;Tactile cues required             PT Long Term Goals - 01/02/17 0809      PT LONG TERM GOAL #1   Title I with advanced HEP ( 02/05/17)    Time 6   Period Weeks   Status On-going     PT LONG TERM GOAL #2   Title demo cervical ROM WNL and painfree ( 02/05/17)    Time 6   Period Weeks   Status On-going     PT LONG TERM GOAL #3   Title improve FOTO =/< 325 limited ( 02/05/17)    Time 6   Period Weeks   Status On-going     PT LONG TERM GOAL #4   Title demo Lt UE strength =/> Rt to allow her to return to her prior level of activity ( 02/05/17)    Time 6    Period Weeks   Status On-going     PT LONG TERM GOAL #5   Title report =/> 75% reduction of neck pain and hand numbness ( 02/05/17)    Time 6   Period Weeks   Status On-going               Plan - 01/02/17 0845    Clinical Impression Statement Improved pain but persistent numbness/tingling Lt UE. Patient has muscular tightness through the ant/lat/post cervical; pecs; traps Lt >> Rt. Responded well to manual work and modalities.    Rehab Potential Excellent   PT Frequency 2x / week   PT Duration 6 weeks   PT Treatment/Interventions Moist Heat;Ultrasound;Traction;Therapeutic exercise;Dry needling;Taping;Manual techniques;Neuromuscular re-education;Cryotherapy;Electrical Stimulation;Iontophoresis 4mg /ml Dexamethasone;Patient/family education;Passive range of motion   PT Next Visit Plan assess response manual work and modalities; resume cervical traction, increase pull if tolerated well if indicated.  Manual work PROM to cervical spine and STM.  Progress scapular work.    Consulted and Agree with Plan of Care Patient      Patient will benefit from skilled therapeutic intervention in order to improve the following deficits and impairments:  Decreased range of motion, Increased muscle spasms, Impaired UE functional use, Pain, Hypomobility, Decreased strength, Postural dysfunction  Visit Diagnosis: Radiculopathy, cervical region  Muscle weakness (generalized)  Other symptoms and signs involving the musculoskeletal system     Problem List Patient Active Problem List   Diagnosis Date Noted  . Radiculitis of left cervical region 12/18/2016  . Macrocytosis without anemia 12/05/2016  . Abnormal dreams 11/21/2016  . 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 Anxiety 06/09/2016    Ulisses Vondrak Nilda Simmer PT, MPH  01/02/2017, 8:48 AM  [facility] [place], [place], [address] Phone: [phone]   Fax:   [phone]  Name: Jady Braggs MRN: [id number] Date of Birth: [birth year]

## 2017-01-04 ENCOUNTER — Encounter: Payer: 59 | Admitting: Rehabilitative and Restorative Service Providers"

## 2017-01-08 ENCOUNTER — Encounter: Payer: 59 | Admitting: Physical Therapy

## 2017-01-10 ENCOUNTER — Ambulatory Visit (INDEPENDENT_AMBULATORY_CARE_PROVIDER_SITE_OTHER): Payer: 59 | Admitting: Physical Therapy

## 2017-01-10 DIAGNOSIS — M5412 Radiculopathy, cervical region: Secondary | ICD-10-CM | POA: Diagnosis not present

## 2017-01-10 DIAGNOSIS — M6281 Muscle weakness (generalized): Secondary | ICD-10-CM | POA: Diagnosis not present

## 2017-01-10 DIAGNOSIS — R29898 Other symptoms and signs involving the musculoskeletal system: Secondary | ICD-10-CM

## 2017-01-10 NOTE — Therapy (Signed)
[place], [place], [address] Phone: [phone]   Fax:  [phone]  Physical Therapy Treatment  Patient Details  Name: Laura Steele MRN: [id number] Date of Birth: Dec 17, 1975 Referring Provider: Dr. Dianah Field  Encounter Date: 01/10/2017      PT End of Session - 01/10/17 0818    Visit Number 3   Number of Visits 12   Date for PT Re-Evaluation 02/05/17   PT Start Time 0808   PT Stop Time 0904   PT Time Calculation (min) 56 min   Activity Tolerance Patient limited by pain   Behavior During Therapy [facility] for tasks assessed/performed      Past Medical History:  Diagnosis Date  . Allergy   . Anxiety   . Hypoglycemia     Past Surgical History:  Procedure Laterality Date  . ABDOMINAL HYSTERECTOMY      There were no vitals filed for this visit.      Subjective Assessment - 01/10/17 0818    Subjective Pt reports she has continued pain and numbness in LUE.  She reports she has been her exercises, which give her relief at the time, but the pain returns.     Currently in Pain? Yes   Pain Score 6    Pain Location Neck   Pain Orientation Left   Pain Descriptors / Indicators Throbbing;Sharp   Pain Radiating Towards into 2nd / 3rd finger of Lt hand    Aggravating Factors  cold   Pain Relieving Factors medicine, heating pain            [facility] PT Assessment - 01/10/17 0001      Assessment   Medical Diagnosis Lt cervcial radiculitis   Referring Provider Dr. Dianah Field   Onset Date/Surgical Date 12/11/16   Hand Dominance Right   Next MD Visit 01/21/17     [facility]   Cervical - Right Rotation 45   Cervical - Left Rotation 50         [facility] Adult PT Treatment/Exercise - 01/10/17 0001      Neck Exercises: Supine   Neck Retraction 5 secs;10 reps   Shoulder Flexion Both;10 reps  overhead pull, yellow band, to tolerance, 2 sets    Shoulder Flexion Limitations pt reported reduction  of symptoms in Lt hand with this exercise   Other Supine Exercise prolonged snow angel stretch ~ 1 min, then with added nerve glides bilat.    Other Supine Exercise scap squeeze x 5 sec hold x 10 reps; bilat shoulder ER with yellow band x 5 reps (increased symptoms; stopped) ; Lt D2 flexion with yellow band  x 10 reps      Moist Heat Therapy   Number Minutes Moist Heat 15 Minutes   Moist Heat Location Cervical;Shoulder  Lt shd area      Electrical Stimulation   Electrical Stimulation Location bilat cervical Lt shoulder    Electrical Stimulation Action IFC   Electrical Stimulation Parameters  to tolerance    Electrical Stimulation Goals Pain;Tone     Traction   Type of Traction Cervical   Min (lbs) 8   Max (lbs) 14   Hold Time 60   Rest Time 20   Time 15     Manual Therapy   Soft tissue mobilization STM to Lt rhomboid, levator, upper trap, scalenes, bilat cervical paraspinals    Myofascial Release MFR to Lt side of platysma, scalenes, SCM.    Manual Traction gentle manual cervical traction  x 15 sec x 4 reps      Neck Exercises: Stretches   Other Neck Stretches low and mid doorway 30 sec x 2 each   trial of high door stretch x 10 sec.                      PT Long Term Goals - 01/02/17 0809      PT LONG TERM GOAL #1   Title I with advanced HEP ( 02/05/17)    Time 6   Period Weeks   Status On-going     PT LONG TERM GOAL #2   Title demo cervical ROM WNL and painfree ( 02/05/17)    Time 6   Period Weeks   Status On-going     PT LONG TERM GOAL #3   Title improve FOTO =/< 325 limited ( 02/05/17)    Time 6   Period Weeks   Status On-going     PT LONG TERM GOAL #4   Title demo Lt UE strength =/> Rt to allow her to return to her prior level of activity ( 02/05/17)    Time 6   Period Weeks   Status On-going     PT LONG TERM GOAL #5   Title report =/> 75% reduction of neck pain and hand numbness ( 02/05/17)    Time 6   Period Weeks   Status On-going                Plan - 01/10/17 0924    Clinical Impression Statement Pt reported reduction of symptoms in LUE with bilat overhead pull with yellow band.  Increased symptoms with pec stretches, Rt cervical rotation in supine,  and bilat shoulder ER with light resistance.  Pt reported 0/10 pain and no radicular symptoms while on cervical traction with LUE supported by pillow.  Once pt upright, radicular symptoms returned.     Rehab Potential Excellent   PT Frequency 2x / week   PT Duration 6 weeks   PT Treatment/Interventions Moist Heat;Ultrasound;Traction;Therapeutic exercise;Dry needling;Taping;Manual techniques;Neuromuscular re-education;Cryotherapy;Electrical Stimulation;Iontophoresis 4mg /ml Dexamethasone;Patient/family education;Passive range of motion   PT Next Visit Plan cont manual therapy, postural strengthening/re-ed, traction.    Consulted and Agree with Plan of Care Patient      Patient will benefit from skilled therapeutic intervention in order to improve the following deficits and impairments:  Decreased range of motion, Increased muscle spasms, Impaired UE functional use, Pain, Hypomobility, Decreased strength, Postural dysfunction  Visit Diagnosis: Radiculopathy, cervical region  Muscle weakness (generalized)  Other symptoms and signs involving the musculoskeletal system     Problem List Patient Active Problem List   Diagnosis Date Noted  . Radiculitis of left cervical region 12/18/2016  . Macrocytosis without anemia 12/05/2016  . Abnormal dreams 11/21/2016  . 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 Anxiety 06/09/2016   Kerin Perna, PTA 01/10/17 1:53 PM  [facility] [place], [place], [address] Phone: [phone]   Fax:  [phone]  Name: Aneita Kiger MRN: [id number] Date of Birth: 04-01-1975

## 2017-01-15 ENCOUNTER — Ambulatory Visit (INDEPENDENT_AMBULATORY_CARE_PROVIDER_SITE_OTHER): Payer: 59 | Admitting: Physical Therapy

## 2017-01-15 ENCOUNTER — Ambulatory Visit (INDEPENDENT_AMBULATORY_CARE_PROVIDER_SITE_OTHER): Payer: 59 | Admitting: Sports Medicine

## 2017-01-15 ENCOUNTER — Encounter: Payer: Self-pay | Admitting: Sports Medicine

## 2017-01-15 DIAGNOSIS — M6281 Muscle weakness (generalized): Secondary | ICD-10-CM | POA: Diagnosis not present

## 2017-01-15 DIAGNOSIS — M5412 Radiculopathy, cervical region: Secondary | ICD-10-CM

## 2017-01-15 DIAGNOSIS — R29898 Other symptoms and signs involving the musculoskeletal system: Secondary | ICD-10-CM

## 2017-01-15 MED ORDER — DIAZEPAM 5 MG PO TABS: ORAL_TABLET | ORAL | 0 refills | Status: DC

## 2017-01-15 MED ORDER — GABAPENTIN 300 MG PO CAPS: ORAL_CAPSULE | ORAL | 3 refills | Status: DC

## 2017-01-15 NOTE — Patient Instructions (Signed)
Over Head Pull: Narrow Grip     [place] [phone]   On back, knees bent, feet flat, band across thighs, elbows straight but relaxed. Pull hands apart (start). Keeping elbows straight, bring arms up and over head, hands toward floor. Keep pull steady on band. Hold momentarily. Return slowly, keeping pull steady, back to start. Repeat __10_ times. Band color ___yellow ___   Side Pull: Double Arm   On back, knees bent, feet flat. Arms perpendicular to body, shoulder level, elbows straight but relaxed. Pull arms out to sides, elbows straight. Resistance band comes across collarbones, hands toward floor. Hold momentarily. Slowly return to starting position. Repeat _10__ times. Band color __yellow___   Sash   On back, knees bent, feet flat, left hand on left hip, right hand above left. Pull right arm DIAGONALLY (hip to shoulder) across chest. Bring right arm along head toward floor. Hold momentarily. Slowly return to starting position. Repeat __10_ times. Do with left arm. Band color ___yellow__   Shoulder Rotation: Double Arm   On back, knees bent, feet flat, elbows tucked at sides, bent 90, hands palms up. Pull hands apart and down toward floor, keeping elbows near sides. Hold momentarily. Slowly return to starting position. Repeat _10__ times. Band color __yellow____    

## 2017-01-15 NOTE — Therapy (Signed)
[place], [place], [address] Phone: [phone]   Fax:  [phone]  Physical Therapy Treatment  Patient Details  Name: Laura Steele MRN: [id number] Date of Birth: December 15, 1975 Referring Provider: Dr. Dianah Field  Encounter Date: 01/15/2017      PT End of Session - 01/15/17 0902    Visit Number 4   Number of Visits 12   Date for PT Re-Evaluation 02/05/17   PT Start Time 3614  pt arrived late   PT Stop Time 0928  pt had to leave early for another appt   PT Time Calculation (min) 35 min   Activity Tolerance Patient limited by pain   Behavior During Therapy [facility] for tasks assessed/performed      Past Medical History:  Diagnosis Date  . Allergy   . Anxiety   . Hypoglycemia     Past Surgical History:  Procedure Laterality Date  . ABDOMINAL HYSTERECTOMY      There were no vitals filed for this visit.      Subjective Assessment - 01/15/17 0857    Subjective Traction has helped.  Pt reports she has a terrible headache.  She used her TENS unit on her head and neck, with no relief.  she has been supporting her Lt arm with pillow at night and this has helped the pain, but numbness persists.    Patient Stated Goals sleep better, waking about every hour. Get her strength and feeling back in her hand and move like she could.   Currently in Pain? Yes   Pain Score 8    Pain Location Head   Pain Orientation Right;Left   Pain Descriptors / Indicators Sharp   Aggravating Factors  cold    Pain Relieving Factors heat, medicine            [facility] PT Assessment - 01/15/17 0001      Assessment   Medical Diagnosis Lt cervcial radiculitis   Referring Provider Dr. Dianah Field   Onset Date/Surgical Date 12/11/16   Hand Dominance Right   Next MD Visit 01/15/17          [facility] Adult PT Treatment/Exercise - 01/15/17 0001      Self-Care   Self-Care Other Self-Care Comments   Other Self-Care  Comments  Pt educated on self-subocciptal release with tennis balls.  Pt used massage blocks, had relief with use of massage blocks for 30-60 sec increments x 3 reps.       Neck Exercises: Standing   Other Standing Exercises scap squeezes x 10 sec x 10 reps      Neck Exercises: Supine   Shoulder Flexion Both;10 reps  yellow band   Other Supine Exercise horiz abd with yellow x 10 reps; D53flex x 10 with yellow band.      Moist Heat Therapy   Number Minutes Moist Heat 15 Minutes  during traction   Moist Heat Location Cervical;Shoulder     Electrical Stimulation   Electrical Stimulation Location bilat cervical Lt shoulder    Electrical Stimulation Action IFC   Electrical Stimulation Parameters to tolerance    Electrical Stimulation Goals Pain;Tone     Traction   Type of Traction Cervical   Min (lbs) 8   Max (lbs) 14   Hold Time 60   Rest Time 20   Time 15     Neck Exercises: Stretches   Other Neck Stretches 3 position doorway stretch x 30 sec x 2 reps each position  PT Education - 01/15/17 0926    Education provided Yes   Education Details Self-suboccipital release, HEP    Person(s) Educated Patient   Methods Explanation;Handout;Verbal cues;Demonstration   Comprehension Verbalized understanding;Returned demonstration             PT Long Term Goals - 01/02/17 0809      PT LONG TERM GOAL #1   Title I with advanced HEP ( 02/05/17)    Time 6   Period Weeks   Status On-going     PT LONG TERM GOAL #2   Title demo cervical ROM WNL and painfree ( 02/05/17)    Time 6   Period Weeks   Status On-going     PT LONG TERM GOAL #3   Title improve FOTO =/< 325 limited ( 02/05/17)    Time 6   Period Weeks   Status On-going     PT LONG TERM GOAL #4   Title demo Lt UE strength =/> Rt to allow her to return to her prior level of activity ( 02/05/17)    Time 6   Period Weeks   Status On-going     PT LONG TERM GOAL #5   Title report =/> 75%  reduction of neck pain and hand numbness ( 02/05/17)    Time 6   Period Weeks   Status On-going               Plan - 01/15/17 3151    Clinical Impression Statement Pt has had positive response to traction during therapy.  With modification of sleep position she is having less symptoms.  Treatment limited due to pt's late arrival and early departure for another appt.  Pt making gradual progress towards goals.    Rehab Potential Excellent   PT Frequency 2x / week   PT Duration 6 weeks   PT Treatment/Interventions Moist Heat;Ultrasound;Traction;Therapeutic exercise;Dry needling;Taping;Manual techniques;Neuromuscular re-education;Cryotherapy;Electrical Stimulation;Iontophoresis 4mg /ml Dexamethasone;Patient/family education;Passive range of motion   PT Next Visit Plan cont manual therapy, postural strengthening/re-ed, traction.    Consulted and Agree with Plan of Care Patient      Patient will benefit from skilled therapeutic intervention in order to improve the following deficits and impairments:  Decreased range of motion, Increased muscle spasms, Impaired UE functional use, Pain, Hypomobility, Decreased strength, Postural dysfunction  Visit Diagnosis: Radiculopathy, cervical region  Muscle weakness (generalized)  Other symptoms and signs involving the musculoskeletal system     Problem List Patient Active Problem List   Diagnosis Date Noted  . Radiculitis of left cervical region 12/18/2016  . Macrocytosis without anemia 12/05/2016  . Abnormal dreams 11/21/2016  . 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 Anxiety 06/09/2016   Kerin Perna, PTA 01/15/17 9:35 AM  [place], [place], [address] Phone: [phone]   Fax:  [phone]  Name: Laura Steele MRN: [id number] Date of Birth: October 16, 1975

## 2017-01-15 NOTE — Progress Notes (Signed)
  Subjective:    CC: Follow-up  HPI: Laura Steele returns, she is a pleasant 41 year old female, she saw me for a month ago with left hand paresthesias into the thumb and index and middle finger.  She was wearing a night splint for carpal tunnel which only provided meager relief.  She did have some neck pain so we suspected a radicular source, x-rays confirmed multilevel cervical degenerative changes, prednisone helped to some degree, physical therapy has helped as well, her thumb paresthesias have resolved but she continues to have second and third finger paresthesias consistent with a C7 radiculitis.  Past medical history:  Negative.  See flowsheet/record as well for more information.  Surgical history: Negative.  See flowsheet/record as well for more information.  Family history: Negative.  See flowsheet/record as well for more information.  Social history: Negative.  See flowsheet/record as well for more information.  Allergies, and medications have been entered into the medical record, reviewed, and no changes needed.   Review of Systems: No fevers, chills, night sweats, weight loss, chest pain, or shortness of breath.   Objective:    General: Well Developed, well nourished, and in no acute distress.  Neuro: Alert and oriented x3, extra-ocular muscles intact, sensation grossly intact.  HEENT: Normocephalic, atraumatic, pupils equal round reactive to light, neck supple, no masses, no lymphadenopathy, thyroid nonpalpable.  Skin: Warm and dry, no rashes. Cardiac: Regular rate and rhythm, no murmurs rubs or gallops, no lower extremity edema.  Respiratory: Clear to auscultation bilaterally. Not using accessory muscles, speaking in full sentences. Neck: Negative spurling's Full neck range of motion Grip strength and sensation normal in bilateral hands Strength good C4 to T1 distribution No sensory change to C4 to T1 Reflexes normal  Impression and Recommendations:    Radiculitis of left  cervical region Left C7 distribution radiculitis, improved with physical therapy, steroids but still with persistent paresthesias. At this point symptoms have been present for greater than 6 weeks in spite of physical therapy, adding an MRI for interventional planning, I do also think she has some carpal tunnel syndrome complicating the picture. Adding Valium for preprocedural anxiolysis bedtime. Return to go over MRI results.  I spent 25 minutes with this patient, greater than 50% was face-to-face time counseling regarding the above diagnoses ___________________________________________ Gwen Her. Dianah Field, M.D., ABFM., CAQSM. Primary Care and [place] Instructor of [place] [facility] of Medicine

## 2017-01-15 NOTE — Assessment & Plan Note (Signed)
Left C7 distribution radiculitis, improved with physical therapy, steroids but still with persistent paresthesias. At this point symptoms have been present for greater than 6 weeks in spite of physical therapy, adding an MRI for interventional planning, I do also think she has some carpal tunnel syndrome complicating the picture. Adding Valium for preprocedural anxiolysis bedtime. Return to go over MRI results.

## 2017-01-16 ENCOUNTER — Encounter: Payer: Self-pay | Admitting: Sports Medicine

## 2017-01-16 ENCOUNTER — Encounter: Payer: Self-pay | Admitting: Family Medicine

## 2017-01-17 ENCOUNTER — Encounter: Payer: Self-pay | Admitting: Rehabilitative and Restorative Service Providers"

## 2017-01-17 ENCOUNTER — Ambulatory Visit (INDEPENDENT_AMBULATORY_CARE_PROVIDER_SITE_OTHER): Payer: 59 | Admitting: Rehabilitative and Restorative Service Providers"

## 2017-01-17 DIAGNOSIS — R29898 Other symptoms and signs involving the musculoskeletal system: Secondary | ICD-10-CM | POA: Diagnosis not present

## 2017-01-17 DIAGNOSIS — M5412 Radiculopathy, cervical region: Secondary | ICD-10-CM

## 2017-01-17 DIAGNOSIS — M6281 Muscle weakness (generalized): Secondary | ICD-10-CM | POA: Diagnosis not present

## 2017-01-17 NOTE — Therapy (Signed)
[place], [place], [address] Phone: [phone]   Fax:  [phone]  Physical Therapy Treatment  Patient Details  Name: Laura Steele MRN: [id number] Date of Birth: [birth year] Referring Provider: Dr. Dianah Field  Encounter Date: 01/17/2017      PT End of Session - 01/17/17 0800    Visit Number 5   Number of Visits 12   Date for PT Re-Evaluation 02/05/17   PT Start Time 0800   PT Stop Time 3329   PT Time Calculation (min) 54 min   Activity Tolerance Patient tolerated treatment well      Past Medical History:  Diagnosis Date  . Allergy   . Anxiety   . Hypoglycemia     Past Surgical History:  Procedure Laterality Date  . ABDOMINAL HYSTERECTOMY      There were no vitals filed for this visit.      Subjective Assessment - 01/17/17 0802    Subjective Some neck pain persists but the big problem is the Lt UE pain and tingling. Treatment helps some but symptoms continue . Will schedule MRI per MD.    Pain Score 3    Pain Location Neck   Pain Orientation Right;Left   Pain Descriptors / Indicators Dull;Aching   Pain Type Acute pain   Pain Onset More than a month ago   Pain Frequency Constant   Aggravating Factors  cold   Pain Relieving Factors heat medication therapy                          [facility] Adult PT Treatment/Exercise - 01/17/17 0001      Neck Exercises: Supine   Neck Retraction 5 secs;10 reps   Neck Retraction Limitations nodding yes to encourage cervical mobilty 2 sets of 10 PT applying deep pressure through  Lt upper trap/leveator for 10 reps then 10 without PT pressure      Moist Heat Therapy   Number Minutes Moist Heat 20 Minutes   Moist Heat Location Cervical;Shoulder     Electrical Stimulation   Electrical Stimulation Location Lt cervical/upper trap   Electrical Stimulation Action IFC   Electrical Stimulation Parameters to tolerance   Electrical  Stimulation Goals Pain;Tone     Traction   Min (lbs) 15   Max (lbs) 20   Hold Time 60   Rest Time 20   Time 15     Manual Therapy   Manual therapy comments patient supine    Joint Mobilization cervical and upper thoracic CPA and lateral glides    Soft tissue mobilization STM to Lt rhomboid, levator, upper trap, scalenes, bilat cervical paraspinals    Myofascial Release MFR to Lt side of platysma, scalenes, SCM.    Manual Traction manual cervical traction x 15 sec x 4 reps      Neck Exercises: Stretches   Other Neck Stretches 3 position doorway stretch x 30 sec x 2 reps each position                     PT Long Term Goals - 01/17/17 0801      PT LONG TERM GOAL #1   Title I with advanced HEP ( 02/05/17)    Time 6   Period Weeks   Status On-going     PT LONG TERM GOAL #2   Title demo cervical ROM WNL and painfree ( 02/05/17)    Time 6   Period  Weeks   Status On-going     PT LONG TERM GOAL #3   Title improve FOTO =/< 32% limited ( 02/05/17)    Time 6   Period Weeks   Status On-going     PT LONG TERM GOAL #4   Title demo Lt UE strength =/> Rt to allow her to return to her prior level of activity ( 02/05/17)    Time 6   Period Weeks   Status On-going     PT LONG TERM GOAL #5   Title report =/> 75% reduction of neck pain and hand numbness ( 02/05/17)    Time 6   Period Weeks   Status On-going               Plan - 01/17/17 0848    Clinical Impression Statement Some improvement with treatment including manual work; mechanical traction and modalities. Note significant muscular tightness through the Lt > Rt ant/lat/post cervical musculature; upper traps; leveator; pecs; teres. Kadie will schedule MRI.    Rehab Potential Good   PT Frequency 2x / week   PT Duration 6 weeks   PT Treatment/Interventions Moist Heat;Ultrasound;Traction;Therapeutic exercise;Dry needling;Taping;Manual techniques;Neuromuscular re-education;Cryotherapy;Electrical  Stimulation;Iontophoresis 4mg /ml Dexamethasone;Patient/family education;Passive range of motion   PT Next Visit Plan cont manual therapy, postural strengthening/re-ed, traction.    Consulted and Agree with Plan of Care Patient      Patient will benefit from skilled therapeutic intervention in order to improve the following deficits and impairments:  Decreased range of motion, Increased muscle spasms, Impaired UE functional use, Pain, Hypomobility, Decreased strength, Postural dysfunction  Visit Diagnosis: Radiculopathy, cervical region  Muscle weakness (generalized)  Other symptoms and signs involving the musculoskeletal system     Problem List Patient Active Problem List   Diagnosis Date Noted  . Radiculitis of left cervical region 12/18/2016  . Macrocytosis without anemia 12/05/2016  . Abnormal dreams 11/21/2016  . 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 Anxiety 06/09/2016    Milus Fritze Nilda Simmer PT, MPH  01/17/2017, 8:51 AM  [facility] [place], [place], [address] Phone: [phone]   Fax:  [phone]  Name: Zeynep Fantroy MRN: [id number] Date of Birth: 12/17/1975

## 2017-01-22 ENCOUNTER — Ambulatory Visit (INDEPENDENT_AMBULATORY_CARE_PROVIDER_SITE_OTHER): Payer: 59

## 2017-01-22 DIAGNOSIS — M47892 Other spondylosis, cervical region: Secondary | ICD-10-CM | POA: Diagnosis not present

## 2017-01-22 DIAGNOSIS — M5412 Radiculopathy, cervical region: Secondary | ICD-10-CM

## 2017-01-22 DIAGNOSIS — M50122 Cervical disc disorder at C5-C6 level with radiculopathy: Secondary | ICD-10-CM | POA: Diagnosis not present

## 2017-01-23 ENCOUNTER — Encounter: Payer: Self-pay | Admitting: Sports Medicine

## 2017-01-23 ENCOUNTER — Ambulatory Visit (INDEPENDENT_AMBULATORY_CARE_PROVIDER_SITE_OTHER): Payer: 59 | Admitting: Sports Medicine

## 2017-01-23 DIAGNOSIS — M5412 Radiculopathy, cervical region: Secondary | ICD-10-CM | POA: Diagnosis not present

## 2017-01-23 NOTE — Progress Notes (Signed)
  Subjective:    CC: MRI follow-up  HPI: This is a pleasant 41 year old female, I diagnosed her with a left C7 radiculopathy, she failed conservative measures, MRI results will be dictated below.  Past medical history:  Negative.  See flowsheet/record as well for more information.  Surgical history: Negative.  See flowsheet/record as well for more information.  Family history: Negative.  See flowsheet/record as well for more information.  Social history: Negative.  See flowsheet/record as well for more information.  Allergies, and medications have been entered into the medical record, reviewed, and no changes needed.   Review of Systems: No fevers, chills, night sweats, weight loss, chest pain, or shortness of breath.   Objective:    General: Well Developed, well nourished, and in no acute distress.  Neuro: Alert and oriented x3, extra-ocular muscles intact, sensation grossly intact.  HEENT: Normocephalic, atraumatic, pupils equal round reactive to light, neck supple, no masses, no lymphadenopathy, thyroid nonpalpable.  Skin: Warm and dry, no rashes. Cardiac: Regular rate and rhythm, no murmurs rubs or gallops, no lower extremity edema.  Respiratory: Clear to auscultation bilaterally. Not using accessory muscles, speaking in full sentences.  MRI shows multilevel protruding discs, worst at the C6-C7 level with a left far lateral protruding disc that does appear to contact the left C7 nerve root as expected.  Impression and Recommendations:    Radiculitis of left cervical region MRI confirms left C6-C7 disc protrusion with contact to the left C7 nerve root. At this point because she is failed conservative measures we are going to proceed with a left C6-C7 interlaminar epidural. Return to see me 1 month after the injection to evaluate relief.  I spent 25 minutes with this patient, greater than 50% was face-to-face time counseling regarding the above  diagnoses ___________________________________________ Gwen Her. Dianah Field, M.D., ABFM., CAQSM. Primary Care and [place] Instructor of [place] of [facility] [employer] [employer]

## 2017-01-23 NOTE — Assessment & Plan Note (Signed)
MRI confirms left C6-C7 disc protrusion with contact to the left C7 nerve root. At this point because she is failed conservative measures we are going to proceed with a left C6-C7 interlaminar epidural. Return to see me 1 month after the injection to evaluate relief.

## 2017-01-24 ENCOUNTER — Encounter: Payer: Self-pay | Admitting: Physical Therapy

## 2017-01-25 ENCOUNTER — Ambulatory Visit (INDEPENDENT_AMBULATORY_CARE_PROVIDER_SITE_OTHER): Payer: 59

## 2017-01-25 DIAGNOSIS — R928 Other abnormal and inconclusive findings on diagnostic imaging of breast: Secondary | ICD-10-CM

## 2017-01-25 DIAGNOSIS — Z1239 Encounter for other screening for malignant neoplasm of breast: Secondary | ICD-10-CM

## 2017-01-26 ENCOUNTER — Encounter: Payer: Self-pay | Admitting: Rehabilitative and Restorative Service Providers"

## 2017-01-26 ENCOUNTER — Other Ambulatory Visit: Payer: Self-pay | Admitting: Family Medicine

## 2017-01-26 ENCOUNTER — Encounter: Payer: Self-pay | Admitting: Family Medicine

## 2017-01-26 DIAGNOSIS — R928 Other abnormal and inconclusive findings on diagnostic imaging of breast: Secondary | ICD-10-CM | POA: Insufficient documentation

## 2017-01-29 ENCOUNTER — Encounter: Payer: Self-pay | Admitting: Family Medicine

## 2017-01-30 ENCOUNTER — Ambulatory Visit
Admission: RE | Admit: 2017-01-30 | Discharge: 2017-01-30 | Disposition: A | Discharge status: Home / Self Care | Payer: 59 | Source: Ambulatory Visit | Attending: Sports Medicine | Admitting: Sports Medicine

## 2017-01-30 MED ORDER — TRIAMCINOLONE ACETONIDE 40 MG/ML IJ SUSP (RADIOLOGY)
60.0000 mg | Freq: Once | INTRAMUSCULAR | Status: AC
  Administered 2017-01-30: 60 mg via EPIDURAL

## 2017-01-30 MED ORDER — IOPAMIDOL (ISOVUE-M 300) INJECTION 61%
1.0000 mL | Freq: Once | INTRAMUSCULAR | Status: AC | PRN
  Administered 2017-01-30: 1 mL via EPIDURAL

## 2017-01-30 NOTE — Discharge Instructions (Signed)

## 2017-01-31 ENCOUNTER — Encounter: Payer: Self-pay | Admitting: Physical Therapy

## 2017-02-01 ENCOUNTER — Other Ambulatory Visit: Payer: Self-pay

## 2017-02-02 ENCOUNTER — Ambulatory Visit (INDEPENDENT_AMBULATORY_CARE_PROVIDER_SITE_OTHER): Payer: 59 | Admitting: Rehabilitative and Restorative Service Providers"

## 2017-02-02 ENCOUNTER — Encounter: Payer: Self-pay | Admitting: Rehabilitative and Restorative Service Providers"

## 2017-02-02 DIAGNOSIS — M5412 Radiculopathy, cervical region: Secondary | ICD-10-CM | POA: Diagnosis not present

## 2017-02-02 DIAGNOSIS — M6281 Muscle weakness (generalized): Secondary | ICD-10-CM

## 2017-02-02 DIAGNOSIS — R29898 Other symptoms and signs involving the musculoskeletal system: Secondary | ICD-10-CM | POA: Diagnosis not present

## 2017-02-02 NOTE — Therapy (Signed)
[place] [address], [place], [address] Phone: [phone]   Fax:  [phone]  Physical Therapy Treatment  Patient Details  Name: Laura Steele MRN: [id number] Date of Birth: [birth year] Referring Provider: Dr Dianah Field   Encounter Date: 02/02/2017  PT End of Session - 02/02/17 0806    Visit Number  6    Number of Visits  12    Date for PT Re-Evaluation  03/16/17    PT Start Time  0802    PT Stop Time  7939    PT Time Calculation (min)  53 min    Activity Tolerance  Patient tolerated treatment well       Past Medical History:  Diagnosis Date  . Allergy   . Anxiety   . Hypoglycemia     Past Surgical History:  Procedure Laterality Date  . ABDOMINAL HYSTERECTOMY      There were no vitals filed for this visit.  Subjective Assessment - 02/02/17 0806    Subjective  Patient reports that she received cervical ESI Tuesday afternoon but has not noticed a difference yet. Continues to have the same symptoms. Lt UE pain and tingling.     Currently in Pain?  Yes    Pain Score  5     Pain Location  Neck    Pain Orientation  Left    Pain Descriptors / Indicators  Dull;Aching    Pain Type  Acute pain    Pain Onset  More than a month ago    Pain Frequency  Constant         [facility] PT Assessment - 02/02/17 0001      Assessment   Medical Diagnosis  Lt cervcial radiculitis    Referring Provider  Dr Dianah Field    Onset Date/Surgical Date  12/11/16    Hand Dominance  Right    Next MD Visit  01/15/17      AROM   Overall AROM Comments  discomfort with all cervical motions     Cervical Flexion  28    Cervical Extension  42    Cervical - Right Side Bend  38    Cervical - Left Side Bend  35    Cervical - Right Rotation  63    Cervical - Left Rotation  55      Strength   Right/Left Shoulder  -- mild to mod discomfort with resistive testing     Left Shoulder Flexion  4+/5    Left Shoulder Extension   5/5    Left Shoulder ABduction  4+/5    Left Shoulder Internal Rotation  5/5    Left Shoulder External Rotation  5/5    Left Elbow Flexion  5/5    Left Elbow Extension  5/5      Palpation   Palpation comment  tightness in the Lt pec; upper trap/levator and anterior/lateral/posterior muscles                  [facility] Adult PT Treatment/Exercise - 02/02/17 0001      Neck Exercises: Standing   Neck Retraction  5 reps;10 secs    Other Standing Exercises  scap squeezes x 10 sec x 10 reps       Neck Exercises: Supine   Neck Retraction  5 secs;10 reps    Neck Retraction Limitations  nodding yes to encourage cervical mobilty 2 sets of 10 PT applying deep pressure through  Lt upper trap/leveator for 10 reps  then 10 without PT pressure     Other Supine Exercise  prolonged pec stretch - snow angel x ~ 2 min bending elbows as needed to reelease stretch      Manual Therapy   Manual therapy comments  patient supine     Soft tissue mobilization  STM to Lt rhomboid, levator, upper trap, scalenes, bilat cervical paraspinals     Myofascial Release  MFR to Lt side of platysma, scalenes, SCM.     Manual Traction  gentle manual cervical traction x ~-10-15 sec x ~ 4 reps  during manual work       Neck Exercises: Stretches   Other Neck Stretches  3 position doorway stretch x 30 sec x 2 reps each position                  PT Long Term Goals - 02/02/17 0841      PT LONG TERM GOAL #1   Title  I with advanced HEP ( 03/16/17)     Time  12    Period  Weeks    Status  Revised      PT LONG TERM GOAL #2   Title  demo cervical ROM WNL and painfree ( 03/16/17)     Time  12    Period  Weeks    Status  Revised      PT LONG TERM GOAL #3   Title  improve FOTO =/< 32% limited ( 03/16/17)     Time  12    Period  Weeks    Status  Revised      PT LONG TERM GOAL #4   Title  demo Lt UE strength =/> Rt to allow her to return to her prior level of activity ( 03/16/17)     Time  12     Period  Weeks    Status  Revised      PT LONG TERM GOAL #5   Title  report =/> 75% reduction of neck pain and hand numbness ( 03/16/17)     Time  12    Period  Weeks    Status  Revised            Plan - 02/02/17 0838    Clinical Impression Statement  Deondria returns following [facility] 01/30/17. She reports no change in radicular symptoms since [facility] but MD said it may take 4-5 days for her to notice results. Patient does demonstrate increased cervical ROM and Lt UE strength. She continues to have significant muscular tightness through ant/lat/post cervical muscualture; upper traps; pecs Lt > Rt. She will benefit from continued PT to address muscular tightness and radicular symptoms.     Rehab Potential  Good    PT Frequency  2x / week    PT Duration  6 weeks    PT Treatment/Interventions  Moist Heat;Ultrasound;Traction;Therapeutic exercise;Dry needling;Taping;Manual techniques;Neuromuscular re-education;Cryotherapy;Electrical Stimulation;Iontophoresis 4mg /ml Dexamethasone;Patient/family education;Passive range of motion    PT Next Visit Plan  cont manual therapy, postural strengthening/re-ed, traction as indicated - held today to fully assess response to [facility]; patient may try DN if symtpoms persist     Consulted and Agree with Plan of Care  Patient       Patient will benefit from skilled therapeutic intervention in order to improve the following deficits and impairments:  Decreased range of motion, Increased muscle spasms, Impaired UE functional use, Pain, Hypomobility, Decreased strength, Postural dysfunction  Visit Diagnosis: Radiculopathy, cervical region - Plan: PT plan of care  cert/re-cert  Muscle weakness (generalized) - Plan: PT plan of care cert/re-cert  Other symptoms and signs involving the musculoskeletal system - Plan: PT plan of care cert/re-cert     Problem List Patient Active Problem List   Diagnosis Date Noted  . Abnormal mammogram of right breast 01/26/2017  .  Radiculitis of left cervical region 12/18/2016  . Macrocytosis without anemia 12/05/2016  . Abnormal dreams 11/21/2016  . 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 Anxiety 06/09/2016    Jelani Vreeland Nilda Simmer PT, MPH  02/02/2017, 8:45 AM  [facility] [place], [place], [address] Phone: [phone]   Fax:  [phone]  Name: Melaney Tellefsen MRN: [id number] Date of Birth: [birth year]

## 2017-02-05 ENCOUNTER — Encounter: Payer: Self-pay | Admitting: Physical Therapy

## 2017-02-05 ENCOUNTER — Ambulatory Visit (INDEPENDENT_AMBULATORY_CARE_PROVIDER_SITE_OTHER): Payer: 59 | Admitting: Physical Therapy

## 2017-02-05 DIAGNOSIS — M6281 Muscle weakness (generalized): Secondary | ICD-10-CM | POA: Diagnosis not present

## 2017-02-05 DIAGNOSIS — R29898 Other symptoms and signs involving the musculoskeletal system: Secondary | ICD-10-CM | POA: Diagnosis not present

## 2017-02-05 DIAGNOSIS — M5412 Radiculopathy, cervical region: Secondary | ICD-10-CM | POA: Diagnosis not present

## 2017-02-05 NOTE — Patient Instructions (Signed)

## 2017-02-05 NOTE — Therapy (Addendum)
[place], [place], [address] Phone: [phone]   Fax:  [phone]  Physical Therapy Treatment  Patient Details  Name: Laura Steele MRN: [id number] Date of Birth: 1975-07-25 Referring Provider: Dr. Dianah Field   Encounter Date: 02/05/2017  PT End of Session - 02/05/17 0726    Visit Number  7    Number of Visits  12    Date for PT Re-Evaluation  03/16/17    PT Start Time  0715    PT Stop Time  0817    PT Time Calculation (min)  62 min    Activity Tolerance  Patient limited by pain    Behavior During Therapy  [facility] for tasks assessed/performed       Past Medical History:  Diagnosis Date  . Allergy   . Anxiety   . Hypoglycemia     Past Surgical History:  Procedure Laterality Date  . ABDOMINAL HYSTERECTOMY      There were no vitals filed for this visit.  Subjective Assessment - 02/05/17 0721    Subjective  Laura Steele reports she had relief after last session. However She sat in recliner and watched Christmas movies over wkend and her neck tightened up significantly.  No relief with heat, muscle relaxers, or pain meds.  She can't turn head in any direction without a lot of pain.     Patient Stated Goals  sleep better, waking about every hour. Get her strength and feeling back in her hand and move like she could.    Currently in Pain?  Yes    Pain Score  8     Pain Location  Neck    Pain Orientation  Left    Pain Descriptors / Indicators  Tightness;Shooting;Sharp    Aggravating Factors   cold outside    Pain Relieving Factors  ??         [facility] PT Assessment - 02/05/17 0001      Assessment   Medical Diagnosis  Lt cervcial radiculitis    Referring Provider  Dr. Dianah Field    Onset Date/Surgical Date  12/11/16    Hand Dominance  Right    Next MD Visit  03/05/17           [facility] Adult PT Treatment/Exercise - 02/05/17 0001      Neck Exercises: Standing   Other Standing  Exercises  scap squeezes x 10 sec x 10 reps       Neck Exercises: Supine   Neck Retraction  10 reps with nodding yes each rep.     Cervical Rotation  Right;Left;5 reps;Limitations with 5 nods yes each rep.     Cervical Rotation Limitations  increased headache with rotation Rt; resolved with rotation Lt. .       Moist Heat Therapy   Number Minutes Moist Heat  20 Minutes    Moist Heat Location  Cervical;Shoulder      Electrical Stimulation   Electrical Stimulation Location  Lt cervical/upper trap    Electrical Stimulation Action  IFC    Electrical Stimulation Parameters  to tolerance     Electrical Stimulation Goals  Tone;Pain      Manual Therapy   Joint Mobilization  lateral glides to lower to mid cervical Lt to Rt; CPA grade II T1 and lower cervical provided by Gillermo Murdoch, PT    Soft tissue mobilization  Deep tissue to anterior lateral, posterior cervical musculature.  STM to [employer] maj/minor    Manual  Traction  gentle manual cervical traction x ~-10-15 sec x ~ 4 reps  during manual work       Neck Exercises: Stretches   Other Neck Stretches  3 position doorway stretch x 30 sec x 2 reps each position- VC for posture and form      Trigger Point Dry Needling - 02/05/17 1211    Consent Given?  Yes    Education Handout Provided  Yes    Muscles Treated Upper Body  Sternocleidomastoid;Upper trapezius Lt - lateral cervical     Sternocleidomastoid Response  -- Lt     Upper Trapezius Response  -- Lt        PT Education - 02/05/17 0745    Education provided  Yes    Education Details  DN info    Person(s) Educated  Patient    Methods  Explanation;Handout    Comprehension  Verbalized understanding          PT Long Term Goals - 02/02/17 0841      PT LONG TERM GOAL #1   Title  I with advanced HEP ( 03/16/17)     Time  12    Period  Weeks    Status  Revised      PT LONG TERM GOAL #2   Title  demo cervical ROM WNL and painfree ( 03/16/17)     Time  12    Period  Weeks     Status  Revised      PT LONG TERM GOAL #3   Title  improve FOTO =/< 32% limited ( 03/16/17)     Time  12    Period  Weeks    Status  Revised      PT LONG TERM GOAL #4   Title  demo Lt UE strength =/> Rt to allow her to return to her prior level of activity ( 03/16/17)     Time  12    Period  Weeks    Status  Revised      PT LONG TERM GOAL #5   Title  report =/> 75% reduction of neck pain and hand numbness ( 03/16/17)     Time  12    Period  Weeks    Status  Revised            Plan - 02/05/17 0733    Clinical Impression Statement  Pt had positive response to manual therapy last session, but relief was short lived.  She had a flare up of symptoms that has limited her ROM today including limited tolerance for exercise.  Notable tightness in anterior lateral cervical musculature.   observed pt to have improved mobility in cspine and she reported reduced pain at end of session.    Rehab Potential  Good    PT Frequency  2x / week    PT Duration  6 weeks    PT Treatment/Interventions  Moist Heat;Ultrasound;Traction;Therapeutic exercise;Dry needling;Taping;Manual techniques;Neuromuscular re-education;Cryotherapy;Electrical Stimulation;Iontophoresis 110m/ml Dexamethasone;Patient/family education;Passive range of motion    PT Next Visit Plan  assess response to DN.     Consulted and Agree with Plan of Care  Patient       Patient will benefit from skilled therapeutic intervention in order to improve the following deficits and impairments:  Decreased range of motion, Increased muscle spasms, Impaired UE functional use, Pain, Hypomobility, Decreased strength, Postural dysfunction  Visit Diagnosis: Radiculopathy, cervical region  Muscle weakness (generalized)  Other symptoms and signs involving the musculoskeletal system  Problem List Patient Active Problem List   Diagnosis Date Noted  . Abnormal mammogram of right breast 01/26/2017  . Radiculitis of left cervical region  12/18/2016  . Macrocytosis without anemia 12/05/2016  . Abnormal dreams 11/21/2016  . 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 Anxiety 06/09/2016   Kerin Perna, PTA 02/05/17 12:18 PM   Celyn P. Helene Kelp PT, MPH 02/05/17 12:18 PM    [facility] [place], [place], [address] Phone: [phone]   Fax:  [phone]  Name: Laura Steele MRN: [id number] Date of Birth: 06-17-1975    PHYSICAL THERAPY DISCHARGE SUMMARY  Visits from Start of Care: 7  Current functional level related to goals / functional outcomes: unknown   Remaining deficits: unknown   Education / Equipment: HEP Plan: Patient agrees to discharge.  Patient goals were not met. Patient is being discharged due to a change in medical status.Pt seeking surgical intervention/advice   ????     Jeral Pinch, PT 03/08/17 5:20 PM

## 2017-02-06 ENCOUNTER — Other Ambulatory Visit: Payer: Self-pay | Admitting: Family Medicine

## 2017-02-06 ENCOUNTER — Encounter: Payer: Self-pay | Admitting: Sports Medicine

## 2017-02-06 DIAGNOSIS — M654 Radial styloid tenosynovitis [de Quervain]: Secondary | ICD-10-CM

## 2017-02-06 DIAGNOSIS — M5412 Radiculopathy, cervical region: Secondary | ICD-10-CM

## 2017-02-06 NOTE — Telephone Encounter (Signed)
Do you want to fill this Rx we last saw patient 12/12/16 for CPE and she has been referred to sports med and is in therapy. Please advise

## 2017-02-08 ENCOUNTER — Ambulatory Visit
Admission: RE | Admit: 2017-02-08 | Discharge: 2017-02-08 | Disposition: A | Discharge status: Home / Self Care | Payer: 59 | Source: Ambulatory Visit | Attending: Family Medicine | Admitting: Family Medicine

## 2017-02-08 DIAGNOSIS — R928 Other abnormal and inconclusive findings on diagnostic imaging of breast: Secondary | ICD-10-CM

## 2017-02-09 ENCOUNTER — Encounter: Payer: Self-pay | Admitting: Rehabilitative and Restorative Service Providers"

## 2017-03-05 ENCOUNTER — Ambulatory Visit: Payer: Self-pay | Admitting: Sports Medicine

## 2017-03-12 ENCOUNTER — Ambulatory Visit (INDEPENDENT_AMBULATORY_CARE_PROVIDER_SITE_OTHER): Payer: 59 | Admitting: Sports Medicine

## 2017-03-12 DIAGNOSIS — M5412 Radiculopathy, cervical region: Secondary | ICD-10-CM | POA: Diagnosis not present

## 2017-03-12 MED ORDER — TRAMADOL HCL 50 MG PO TABS: ORAL_TABLET | ORAL | 0 refills | Status: DC

## 2017-03-12 NOTE — Progress Notes (Signed)
  Subjective:    CC: Left cervical radiculitis  HPI: Still is now about 1 month post cervical epidural with no improvement, not even temporary.  Gabapentin has also not been very effective.  Past medical history:  Negative.  See flowsheet/record as well for more information.  Surgical history: Negative.  See flowsheet/record as well for more information.  Family history: Negative.  See flowsheet/record as well for more information.  Social history: Negative.  See flowsheet/record as well for more information.  Allergies, and medications have been entered into the medical record, reviewed, and no changes needed.   (To billers/coders, pertinent past medical, social, surgical, family history can be found in problem list, if problem list is marked as reviewed then this indicates that past medical, social, surgical, family history was also reviewed)  Review of Systems: No fevers, chills, night sweats, weight loss, chest pain, or shortness of breath.   Objective:    General: Well Developed, well nourished, and in no acute distress.  Neuro: Alert and oriented x3, extra-ocular muscles intact, sensation grossly intact.  HEENT: Normocephalic, atraumatic, pupils equal round reactive to light, neck supple, no masses, no lymphadenopathy, thyroid nonpalpable.  Skin: Warm and dry, no rashes. Cardiac: Regular rate and rhythm, no murmurs rubs or gallops, no lower extremity edema.  Respiratory: Clear to auscultation bilaterally. Not using accessory muscles, speaking in full sentences.  Impression and Recommendations:    Radiculitis of left cervical region C7 protruding disc. Unfortunately has not noted any improvement with gabapentin or with a cervical epidural. She has been through physical therapy already. Adding tramadol for pain relief and we are simply awaiting her appointment with [facility]. ___________________________________________ Gwen Her. Dianah Field, M.D., ABFM.,  CAQSM. Primary Care and [place] Instructor of [place] [employer] [facility] [employer]

## 2017-03-12 NOTE — Assessment & Plan Note (Signed)
C7 protruding disc. Unfortunately has not noted any improvement with gabapentin or with a cervical epidural. She has been through physical therapy already. Adding tramadol for pain relief and we are simply awaiting her appointment with [facility].

## 2017-03-27 HISTORY — PX: ANTERIOR CERVICAL DISCECTOMY: SHX1160

## 2017-04-25 ENCOUNTER — Encounter: Payer: Self-pay | Admitting: Family Medicine

## 2017-05-15 ENCOUNTER — Other Ambulatory Visit: Payer: Self-pay | Admitting: Neurosurgery

## 2017-05-15 DIAGNOSIS — M50223 Other cervical disc displacement at C6-C7 level: Secondary | ICD-10-CM

## 2017-06-08 ENCOUNTER — Ambulatory Visit: Payer: Self-pay | Admitting: Family Medicine

## 2017-06-22 ENCOUNTER — Encounter: Payer: Self-pay | Admitting: Family Medicine

## 2017-06-22 ENCOUNTER — Ambulatory Visit (INDEPENDENT_AMBULATORY_CARE_PROVIDER_SITE_OTHER): Payer: 59 | Admitting: Family Medicine

## 2017-06-22 VITALS — BP 120/62 | HR 80 | Resp 16 | Wt 145.0 lb

## 2017-06-22 DIAGNOSIS — M62838 Other muscle spasm: Secondary | ICD-10-CM

## 2017-06-22 DIAGNOSIS — G4700 Insomnia, unspecified: Secondary | ICD-10-CM

## 2017-06-22 DIAGNOSIS — L74510 Primary focal hyperhidrosis, axilla: Secondary | ICD-10-CM

## 2017-06-22 DIAGNOSIS — F419 Anxiety disorder, unspecified: Secondary | ICD-10-CM | POA: Diagnosis not present

## 2017-06-22 HISTORY — DX: Insomnia, unspecified: G47.00

## 2017-06-22 MED ORDER — DULOXETINE HCL 30 MG PO CPEP: 30.0000 mg | ORAL_CAPSULE | Freq: Every day | ORAL | 0 refills | Status: DC

## 2017-06-22 MED ORDER — GLYCOPYRROLATE 1 MG PO TABS: 1.0000 mg | ORAL_TABLET | Freq: Every day | ORAL | 1 refills | Status: DC

## 2017-06-22 MED ORDER — BACLOFEN 5 MG PO TABS: 5.0000 mg | ORAL_TABLET | Freq: Three times a day (TID) | ORAL | 5 refills | Status: DC | PRN

## 2017-06-22 MED ORDER — HYDROXYZINE PAMOATE 25 MG PO CAPS: 25.0000 mg | ORAL_CAPSULE | Freq: Every day | ORAL | 5 refills | Status: DC

## 2017-06-22 NOTE — Progress Notes (Signed)
Laura Steele , 1976-03-06, 42 y.o., female MRN: [id number] Patient Care Team    Relationship Specialty Notifications Start End  Ma Hillock, DO PCP - General Family Medicine  10/22/15   Ashok Pall, MD Consulting Physician Neurosurgery  06/22/17   Silverio Decamp, MD Consulting Physician Family Medicine  06/22/17     Chief Complaint  Patient presents with  . Follow-up    anxiety, med refills     Subjective:  Excessive sweating:  Doing well on medication robinol, no known side effects. Would like refills.  Prior note:  Patient states she has had excessive sweating under her armpits for many years. There is an odor that is associated with the excessive sweating. She states the sweating can occur at any time during the day or night. She finds this embarrassing and wonders if there is anything that can be done. Patient is on an SSRI, however this was started within the last year, and sweating predates the start of this medication. She states she's tried every type of deodorant and nothing seems to be effective.  Anxiety: She reports her anxiety is well controlled, however she feels she is now having mild tremors on lexapro. She would like to try a different medication if possible.  Prior note: Patient reports she has been having abnormal dreams 4 2-3 weeks. She states the dreams are very vivid, she wakes up thinking they were real. All of her dreams are about her dog. She states some of them are adults and danger, the dog is missing etc. She is uncertain why she has anxiety surrounding leaving her dog. She also has concerns while awake on leaving the dog alone, and the care for her dog. Her dog is 75-year-old and healthy. She reports she will not even go on vacation, and unless she is able to take her dog with her. She reports she is unable to fall asleep secondary to the stress/anxiety created surrounding her abnormal dreams.   Depression screen [facility] 2/9 06/22/2017 12/18/2016  12/04/2016 06/09/2016 10/22/2015  Decreased Interest 1 0 - 1 0  Down, Depressed, Hopeless 0 0 0 0 2  PHQ - 2 Score 1 0 0 1 2  Altered sleeping 2 - 0 1 3  Tired, decreased energy 3 - 0 2 2  Change in appetite 2 - 0 0 3  Feeling bad or failure about yourself  0 - 0 0 1  Trouble concentrating 0 - 0 0 0  Moving slowly or fidgety/restless 0 - 0 1 2  Suicidal thoughts 0 - 0 0 0  PHQ-9 Score 8 - 0 5 13  Difficult doing work/chores Somewhat difficult - - - -   GAD 7 : Generalized Anxiety Score 06/22/2017 11/20/2016 06/09/2016 10/22/2015  Nervous, Anxious, on Edge 0 3 2 2   Control/stop worrying 1 2 3 1   Worry too much - different things 1 3 3 1   Trouble relaxing 1 3 3 2   Restless 1 2 2 1   Easily annoyed or irritable 1 3 3 2   Afraid - awful might happen 0 3 2 1   Total GAD 7 Score 5 19 18 10   Anxiety Difficulty Somewhat difficult Very difficult Very difficult Very difficult     No Known Allergies Social History   Tobacco Use  . Smoking status: Current Every Day Smoker    Packs/day: 0.50    Types: Cigarettes  . Smokeless tobacco: Never Used  Substance Use Topics  . Alcohol use: Yes  Comment: Pt states was drinkning 40 0z of beer a night. but stopped since sunday.   Past Medical History:  Diagnosis Date  . Allergy   . Anxiety   . Hypoglycemia    Past Surgical History:  Procedure Laterality Date  . ABDOMINAL HYSTERECTOMY    . ANTERIOR CERVICAL DISCECTOMY  03/2017   C6-7; Dr. Christella Noa   Family History  Problem Relation Age of Onset  . Diabetes Mother   . Breast cancer Mother        had masectomy.   . Skin cancer Maternal Grandfather    Allergies as of 06/22/2017   No Known Allergies     Medication List        Accurate as of 06/22/17  9:05 AM. Always use your most recent med list.          Baclofen 5 MG Tabs Take 5 mg by mouth 3 (three) times daily as needed.   DULoxetine 30 MG capsule Commonly known as:  CYMBALTA Take 1 capsule (30 mg total) by mouth daily.     escitalopram 20 MG tablet Commonly known as:  LEXAPRO Take 1 tablet (20 mg total) by mouth daily.   glycopyrrolate 1 MG tablet Commonly known as:  ROBINUL Take 1 tablet (1 mg total) by mouth daily.   hydrOXYzine 25 MG capsule Commonly known as:  VISTARIL Take 1-2 capsules (25-50 mg total) by mouth at bedtime.       All past medical history, surgical history, allergies, family history, immunizations andmedications were updated in the EMR today and reviewed under the history and medication portions of their EMR.     ROS: Negative, with the exception of above mentioned in HPI   Objective:  BP 120/62 (BP Location: Right Arm, Patient Position: Sitting, Cuff Size: Normal)   Pulse 80   Resp 16   Wt 145 lb (65.8 kg)   SpO2 98%   BMI 25.69 kg/m  Body mass index is 25.69 kg/m. Gen: Afebrile. No acute distress.  HENT: AT. [place]. MMM.  Eyes:Pupils Equal Round Reactive to light, Extraocular movements intact,  Conjunctiva without redness, discharge or icterus. Neck/lymp/endocrine: Supple, well healing anterior neck scar from cervical decompression surgery. CV: RRR, no edema.  Chest: CTAB, no wheeze or crackles Skin: no rashes, purpura or petechiae.  Neuro:  Normal gait. PERLA. EOMi. Alert. Oriented. Psych: Normal affect, dress and demeanor. Normal speech. Normal thought content and judgment.   No exam data present No results found. No results found for this or any previous visit (from the past 24 hour(s)).  Assessment/Plan: [name] is a 42 y.o. female present for OV for  Hyperhidrosis - doing well. Refills provided on Robinol.  - follow q 6 mos as long doing well.   Anxiety/insomnia:  - insomnia improved on vistaril, refills provided today.  - anxiety was better on lexapro, but SE of tremor. DC lexapro, start cymbalta 30 mg QD.  - f/u 3 months if doing well on this dose or 4 weeks if not ideal coverage and will consider increasing dose.   Muscle spasm: - decided to  try baclofen low dose. Tried to avoid  Another sedating med.    Reviewed expectations re: course of current medical issues.  Discussed self-management of symptoms.  Outlined signs and symptoms indicating need for more acute intervention.  Patient verbalized understanding and all questions were answered.  Patient received an After-Visit Summary.    No orders of the defined types were placed in this encounter.  Note is dictated utilizing voice recognition software. Although note has been proof read prior to signing, occasional typographical errors still can be missed. If any questions arise, please do not hesitate to call for verification.   electronically signed by:  Howard Pouch, DO  [place]

## 2017-06-22 NOTE — Patient Instructions (Addendum)
Start Cymbalta, in place of lexapro. If doing well on the 30 mg dose, follow up in 3 months. If not ideal result after 4 week, follow up.    I have called baclofen as well, this is a muscle relaxer. Should not cause sedation, but be cautious  When first starting.  Refilled other medications for you.

## 2017-10-24 ENCOUNTER — Telehealth: Payer: Self-pay | Admitting: *Deleted

## 2017-10-24 ENCOUNTER — Telehealth: Payer: Self-pay | Admitting: Family Medicine

## 2017-10-24 ENCOUNTER — Encounter: Payer: Self-pay | Admitting: Family Medicine

## 2017-10-24 ENCOUNTER — Ambulatory Visit (INDEPENDENT_AMBULATORY_CARE_PROVIDER_SITE_OTHER): Payer: 59 | Admitting: Family Medicine

## 2017-10-24 VITALS — BP 130/75 | HR 68 | Temp 98.0°F | Resp 20 | Ht 63.0 in | Wt 139.0 lb

## 2017-10-24 DIAGNOSIS — L74510 Primary focal hyperhidrosis, axilla: Secondary | ICD-10-CM

## 2017-10-24 DIAGNOSIS — D7589 Other specified diseases of blood and blood-forming organs: Secondary | ICD-10-CM

## 2017-10-24 DIAGNOSIS — D582 Other hemoglobinopathies: Secondary | ICD-10-CM | POA: Insufficient documentation

## 2017-10-24 DIAGNOSIS — F419 Anxiety disorder, unspecified: Secondary | ICD-10-CM | POA: Diagnosis not present

## 2017-10-24 DIAGNOSIS — G4700 Insomnia, unspecified: Secondary | ICD-10-CM

## 2017-10-24 DIAGNOSIS — R7309 Other abnormal glucose: Secondary | ICD-10-CM

## 2017-10-24 LAB — CBC WITH DIFFERENTIAL/PLATELET
BASOS PCT: 0.5 % (ref 0.0–3.0)
Basophils Absolute: 0 10*3/uL (ref 0.0–0.1)
EOS PCT: 1.9 % (ref 0.0–5.0)
Eosinophils Absolute: 0.2 10*3/uL (ref 0.0–0.7)
HCT: 48.2 % — ABNORMAL HIGH (ref 36.0–46.0)
HEMOGLOBIN: 16.1 g/dL — AB (ref 12.0–15.0)
Lymphocytes Relative: 24.1 % (ref 12.0–46.0)
Lymphs Abs: 2.3 10*3/uL (ref 0.7–4.0)
MCHC: 33.5 g/dL (ref 30.0–36.0)
MCV: 103.4 fl — ABNORMAL HIGH (ref 78.0–100.0)
MONOS PCT: 12.7 % — AB (ref 3.0–12.0)
Monocytes Absolute: 1.2 10*3/uL — ABNORMAL HIGH (ref 0.1–1.0)
Neutro Abs: 5.7 10*3/uL (ref 1.4–7.7)
Neutrophils Relative %: 60.8 % (ref 43.0–77.0)
Platelets: 234 10*3/uL (ref 150.0–400.0)
RBC: 4.66 Mil/uL (ref 3.87–5.11)
RDW: 13.2 % (ref 11.5–15.5)
WBC: 9.4 10*3/uL (ref 4.0–10.5)

## 2017-10-24 LAB — TSH: TSH: 6.02 u[IU]/mL — ABNORMAL HIGH (ref 0.35–4.50)

## 2017-10-24 LAB — HEMOGLOBIN A1C: Hgb A1c MFr Bld: 5.1 % (ref 4.6–6.5)

## 2017-10-24 LAB — B12 AND FOLATE PANEL
Folate: 5.1 ng/mL — ABNORMAL LOW (ref >5.9)
VITAMIN B 12: 281 pg/mL (ref 211–911)

## 2017-10-24 MED ORDER — HYDROXYZINE HCL 25 MG PO TABS: ORAL_TABLET | ORAL | 5 refills | Status: DC

## 2017-10-24 MED ORDER — DULOXETINE HCL 30 MG PO CPEP: 30.0000 mg | ORAL_CAPSULE | Freq: Every day | ORAL | 0 refills | Status: DC

## 2017-10-24 MED ORDER — GLYCOPYRROLATE 1 MG PO TABS: 1.0000 mg | ORAL_TABLET | Freq: Every day | ORAL | 1 refills | Status: DC

## 2017-10-24 NOTE — Progress Notes (Signed)
Laura Steele , 05-27-1975, 42 y.o., female MRN: [id number] Patient Care Team    Relationship Specialty Notifications Start End  Ma Hillock, DO PCP - General Family Medicine  10/22/15   Ashok Pall, MD Consulting Physician Neurosurgery  06/22/17   Silverio Decamp, MD Consulting Physician Family Medicine  06/22/17     Chief Complaint  Patient presents with  . Anxiety  . Insomnia     Subjective:   Macrocytosis/elevated hemoglobin: reviewed prior labs with her today in detail. She is a smoker and drinks 1-2 alcoholic drinks a day.   Excessive sweating:  Doing well on medication robinol, no known side effects. Would like refills.  Prior note:  Patient states she has had excessive sweating under her armpits for many years. There is an odor that is associated with the excessive sweating. She states the sweating can occur at any time during the day or night. She finds this embarrassing and wonders if there is anything that can be done. Patient is on an SSRI, however this was started within the last year, and sweating predates the start of this medication. She states she's tried every type of deodorant and nothing seems to be effective.  Anxiety: She reports her anxiety is well controlled on cymbalta 30 mg QD.  Prior note: Patient reports she has been having abnormal dreams 4 2-3 weeks. She states the dreams are very vivid, she wakes up thinking they were real. All of her dreams are about her dog. She states some of them are adults and danger, the dog is missing etc. She is uncertain why she has anxiety surrounding leaving her dog. She also has concerns while awake on leaving the dog alone, and the care for her dog. Her dog is 36-year-old and healthy. She reports she will not even go on vacation, and unless she is able to take her dog with her. She reports she is unable to fall asleep secondary to the stress/anxiety created surrounding her abnormal dreams.   Depression screen [facility]  2/9 10/24/2017 06/22/2017 12/18/2016 12/04/2016 06/09/2016  Decreased Interest 0 1 0 - 1  Down, Depressed, Hopeless 0 0 0 0 0  PHQ - 2 Score 0 1 0 0 1  Altered sleeping - 2 - 0 1  Tired, decreased energy - 3 - 0 2  Change in appetite - 2 - 0 0  Feeling bad or failure about yourself  - 0 - 0 0  Trouble concentrating - 0 - 0 0  Moving slowly or fidgety/restless - 0 - 0 1  Suicidal thoughts - 0 - 0 0  PHQ-9 Score - 8 - 0 5  Difficult doing work/chores - Somewhat difficult - - -   GAD 7 : Generalized Anxiety Score 10/24/2017 06/22/2017 11/20/2016 06/09/2016  Nervous, Anxious, on Edge 0 0 3 2  Control/stop worrying 0 1 2 3   Worry too much - different things 0 1 3 3   Trouble relaxing 0 1 3 3   Restless 0 1 2 2   Easily annoyed or irritable 0 1 3 3   Afraid - awful might happen 0 0 3 2  Total GAD 7 Score 0 5 19 18   Anxiety Difficulty - Somewhat difficult Very difficult Very difficult     No Known Allergies Social History   Tobacco Use  . Smoking status: Current Every Day Smoker    Packs/day: 0.50    Types: Cigarettes  . Smokeless tobacco: Never Used  Substance Use Topics  . Alcohol use:  Yes    Comment: Pt states was drinkning 40 0z of beer a night. but stopped since sunday.   Past Medical History:  Diagnosis Date  . Allergy   . Anxiety   . Hypoglycemia    Past Surgical History:  Procedure Laterality Date  . ABDOMINAL HYSTERECTOMY    . ANTERIOR CERVICAL DISCECTOMY  03/2017   C6-7; Dr. Christella Noa   Family History  Problem Relation Age of Onset  . Diabetes Mother   . Breast cancer Mother        had masectomy.   . Skin cancer Maternal Grandfather    Allergies as of 10/24/2017   No Known Allergies     Medication List        Accurate as of 10/24/17  8:40 AM. Always use your most recent med list.          Baclofen 5 MG Tabs Take 5 mg by mouth 3 (three) times daily as needed.   DULoxetine 30 MG capsule Commonly known as:  CYMBALTA Take 1 capsule (30 mg total) by mouth  daily.   glycopyrrolate 1 MG tablet Commonly known as:  ROBINUL Take 1 tablet (1 mg total) by mouth daily.   hydrOXYzine 25 MG capsule Commonly known as:  VISTARIL Take 1-2 capsules (25-50 mg total) by mouth at bedtime.       All past medical history, surgical history, allergies, family history, immunizations andmedications were updated in the EMR today and reviewed under the history and medication portions of their EMR.     ROS: Negative, with the exception of above mentioned in HPI   Objective:  BP 132/83 (BP Location: Left Arm, Patient Position: Sitting, Cuff Size: Normal)   Pulse 68   Temp 98 F (36.7 C)   Resp 20   Ht 5\' 3"  (1.6 m)   Wt 139 lb (63 kg)   SpO2 98%   BMI 24.62 kg/m  Body mass index is 24.62 kg/m. Gen: Afebrile. No acute distress. Nontoxic in appearance.  HENT: AT. [place]. MMM.  Eyes:Pupils Equal Round Reactive to light, Extraocular movements intact,  Conjunctiva without redness, discharge or icterus. Neck/lymp/endocrine: Supple,no lymphadenopathy, no thyromegaly CV: RRR no murmur, no edema, +2/4 P posterior tibialis pulses Chest: CTAB, no wheeze or crackles Abd: Soft. NTND. BS present. no Masses palpated.  Skin: no rashes, purpura or petechiae.  Neuro:  Normal gait. PERLA. EOMi. Alert. Oriented.  Psych: Normal affect, dress and demeanor. Normal speech. Normal thought content and judgment  No exam data present No results found. No results found for this or any previous visit (from the past 24 hour(s)).  Assessment/Plan: Laura Steele is a 42 y.o. female present for OV for  Hyperhidrosis - Stable.  Refills provided on Robinol.  - follow q 6 mos as long doing well.   Anxiety/insomnia:  - Stable. Refills on Cymbalta. Pharmacy is out of stock on her vistaril caps- will call to see if they have tabs. Prior med tried: lexapro with SE of tremor.  - anxiety was better on lexapro, but SE of tremor. DC lexapro, start cymbalta  - f/u 6 months    Macrocytosis without anemia/Elevated hemoglobin (HCC) - decrease alcohol consumption. Start b-complex vitamin. Stop smoking.  - CBC w/Diff - TSH - B12 and Folate Panel - f/u dependent on lab results.   Elevated glucose - pt reports a feeling off w/ a glucose of >300. She has had elevated glucose in the past, placing her in the diabetic zone. With a1c/normal fastin  glucose that are normal. May  Need to restart low dose metformin.  - HgB A1c  - f/u dependent on labs.      Reviewed expectations re: course of current medical issues.  Discussed self-management of symptoms.  Outlined signs and symptoms indicating need for more acute intervention.  Patient verbalized understanding and all questions were answered.  Patient received an After-Visit Summary.    No orders of the defined types were placed in this encounter.    Note is dictated utilizing voice recognition software. Although note has been proof read prior to signing, occasional typographical errors still can be missed. If any questions arise, please do not hesitate to call for verification.   electronically signed by:  Howard Pouch, DO  [place]

## 2017-10-24 NOTE — Patient Instructions (Signed)
Start B complex vitamin (without biotin) daily.  Make the lifestyle changes we talked about today with diet and exercise.  I will call you with lab results once available.   I have refilled your meds and we will call the pharmacy on the vistaril script.    Follow up every 6 months.    Please help [place] help you:  We are honored you have chosen [facility] for your Primary Care home. Below you will find basic instructions that you may need to access in the future. Please help [place] help you by reading the instructions, which cover many of the frequent questions we experience.   Prescription refills and request:  -In order to allow more efficient response time, please call your pharmacy for all refills. They will forward the request electronically to [place]. This allows for the quickest possible response. Request left on a nurse line can take longer to refill, since these are checked as time allows between office patients and other phone calls.  - refill request can take up to 3-5 working days to complete.  - If request is sent electronically and request is appropiate, it is usually completed in 1-2 business days.  - all patients will need to be seen routinely for all chronic medical conditions requiring prescription medications (see follow-up below). If you are overdue for follow up on your condition, you will be asked to make an appointment and we will call in enough medication to cover you until your appointment (up to 30 days).  - all controlled substances will require a face to face visit to request/refill.  - if you desire your prescriptions to go through a new pharmacy, and have an active script at original pharmacy, you will need to call your pharmacy and have scripts transferred to new pharmacy. This is completed between the pharmacy locations and not by your provider.    Results: If any images or labs were ordered, it can take up to 1 week to get results depending on the test ordered and  the lab/facility running and resulting the test. - Normal or stable results, which do not need further discussion, may be released to your mychart immediately with attached note to you. A call may not be generated for normal results. Please make certain to sign up for mychart. If you have questions on how to activate your mychart you can call the front office.  - If your results need further discussion, our office will attempt to contact you via phone, and if unable to reach you after 2 attempts, we will release your abnormal result to your mychart with instructions.  - All results will be automatically released in mychart after 1 week.  - Your provider will provide you with explanation and instruction on all relevant material in your results. Please keep in mind, results and labs may appear confusing or abnormal to the untrained eye, but it does not mean they are actually abnormal for you personally. If you have any questions about your results that are not covered, or you desire more detailed explanation than what was provided, you should make an appointment with your provider to do so.   Our office handles many outgoing and incoming calls daily. If we have not contacted you within 1 week about your results, please check your mychart to see if there is a message first and if not, then contact our office.  In helping with this matter, you help decrease call volume, and therefore allow [place] to  be able to respond to patients needs more efficiently.   Acute office visits (sick visit):  An acute visit is intended for a new problem and are scheduled in shorter time slots to allow schedule openings for patients with new problems. This is the appropriate visit to discuss a new problem. Problems will not be addressed by phone call or Echart message. Appointment is needed if requesting treatment. In order to provide you with excellent quality medical care with proper time for you to explain your problem, have an exam  and receive treatment with instructions, these appointments should be limited to one new problem per visit. If you experience a new problem, in which you desire to be addressed, please make an acute office visit, we save openings on the schedule to accommodate you. Please do not save your new problem for any other type of visit, let us take care of it properly and quickly for you.   Follow up visits:  Depending on your condition(s) your provider will need to see you routinely in order to provide you with quality care and prescribe medication(s). Most chronic conditions (Example: hypertension, Diabetes, depression/anxiety... etc), require visits a couple times a year. Your provider will instruct you on proper follow up for your personal medical conditions and history. Please make certain to make follow up appointments for your condition as instructed. Failing to do so could result in lapse in your medication treatment/refills. If you request a refill, and are overdue to be seen on a condition, we will always provide you with a 30 day script (once) to allow you time to schedule.    Medicare wellness (well visit): - we have a wonderful Nurse Maudie Mercury), that will meet with you and provide you will yearly medicare wellness visits. These visits should occur yearly (can not be scheduled less than 1 calendar year apart) and cover preventive health, immunizations, advance directives and screenings you are entitled to yearly through your medicare benefits. Do not miss out on your entitled benefits, this is when medicare will pay for these benefits to be ordered for you.  These are strongly encouraged by your provider and is the appropriate type of visit to make certain you are up to date with all preventive health benefits. If you have not had your medicare wellness exam in the last 12 months, please make certain to schedule one by calling the office and schedule your medicare wellness with Maudie Mercury as soon as possible.    Yearly physical (well visit):  - Adults are recommended to be seen yearly for physicals. Check with your insurance and date of your last physical, most insurances require one calendar year between physicals. Physicals include all preventive health topics, screenings, medical exam and labs that are appropriate for gender/age and history. You may have fasting labs needed at this visit. This is a well visit (not a sick visit), new problems should not be covered during this visit (see acute visit).  - Pediatric patients are seen more frequently when they are younger. Your provider will advise you on well child visit timing that is appropriate for your their age. - This is not a medicare wellness visit. Medicare wellness exams do not have an exam portion to the visit. Some medicare companies allow for a physical, some do not allow a yearly physical. If your medicare allows a yearly physical you can schedule the medicare wellness with our nurse Maudie Mercury and have your physical with your provider after, on the same day. Please check with  insurance for your full benefits.   Late Policy/No Shows:  - all new patients should arrive 15-30 minutes earlier than appointment to allow [place] time  to  obtain all personal demographics,  insurance information and for you to complete office paperwork. - All established patients should arrive 10-15 minutes earlier than appointment time to update all information and be checked in .  - In our best efforts to run on time, if you are late for your appointment you will be asked to either reschedule or if able, we will work you back into the schedule. There will be a wait time to work you back in the schedule,  depending on availability.  - If you are unable to make it to your appointment as scheduled, please call 24 hours ahead of time to allow [place] to fill the time slot with someone else who needs to be seen. If you do not cancel your appointment ahead of time, you may be charged a no show  fee.

## 2017-10-24 NOTE — Telephone Encounter (Signed)
Please inform patient the following information: Her red blood cells are even larger than prior collection and her hemoglobin is also higher. We discussed potential causes in detail during her visit.  - She does have mildly underactive thyroid, low B12 and low folate. Along with smoking and alcohol use, these are all causes of the blood changes we are seeing in her.   - recs: start the b-complex w/folic acid - most have folic acid in them, but some do not. (no biotin). - try to stop smoking, if she would like assistance with gums/patches or meds, I would be happy to help--> schedule appt to discuss.  - Cut back on the alcohol as we discussed. Avoid daily use, and avoid more than 2 drinks per sitting.  - follow up in 2 months--> we will recollect labs, including thyroid panel. If thyroid still abnormal at that time, will start medication (currenlty it is not high enough and still could be a normal variant). Hopefully we can make improvements and not need to refer to hematologist.   Her diabetes screen is normal at 5.1.

## 2017-10-24 NOTE — Telephone Encounter (Signed)
Hydroxyzine 25 mg capsule changed to tablet form due to availability per Dr Raoul Pitch.

## 2017-10-25 ENCOUNTER — Encounter: Payer: Self-pay | Admitting: Family Medicine

## 2017-10-25 NOTE — Telephone Encounter (Signed)
Patient notified and verbalized understanding. Appointment scheduled for 2 month follow up on current problems.

## 2017-10-25 NOTE — Telephone Encounter (Signed)
Called patient left message for patient to return call 

## 2017-12-15 ENCOUNTER — Encounter: Payer: Self-pay | Admitting: Family Medicine

## 2017-12-18 ENCOUNTER — Telehealth: Payer: Self-pay | Admitting: Family Medicine

## 2017-12-18 ENCOUNTER — Encounter: Payer: Self-pay | Admitting: *Deleted

## 2017-12-18 NOTE — Telephone Encounter (Signed)
There is already a phone note being addressed it has been sent to Dr Raoul Pitch for review.

## 2017-12-18 NOTE — Telephone Encounter (Signed)
dont see where we have seen her for this should this go to Neurosurgeon?

## 2017-12-18 NOTE — Telephone Encounter (Signed)
Copied from [place] [id number]. Topic: Quick Communication - See Telephone Encounter >> Dec 18, 2017  9:34 AM Conception Chancy, NT wrote: CRM for notification. See Telephone encounter for: 12/18/17.  Patient is calling and states that where she had her neck surgery she has 2 knots on her left arm and is needing to wear a brace but her work is requiring a doctors note. She would like a call once this is ready for her to pick up.

## 2017-12-20 NOTE — Telephone Encounter (Signed)
Letter completed patient notified

## 2017-12-24 ENCOUNTER — Ambulatory Visit: Payer: Self-pay | Admitting: Family Medicine

## 2017-12-25 ENCOUNTER — Ambulatory Visit: Payer: Self-pay | Admitting: Family Medicine

## 2017-12-26 ENCOUNTER — Ambulatory Visit (INDEPENDENT_AMBULATORY_CARE_PROVIDER_SITE_OTHER): Payer: 59 | Admitting: Family Medicine

## 2017-12-26 ENCOUNTER — Encounter: Payer: Self-pay | Admitting: Family Medicine

## 2017-12-26 VITALS — BP 135/85 | HR 84 | Temp 98.3°F | Resp 20 | Ht 63.0 in | Wt 138.0 lb

## 2017-12-26 DIAGNOSIS — D7589 Other specified diseases of blood and blood-forming organs: Secondary | ICD-10-CM | POA: Diagnosis not present

## 2017-12-26 DIAGNOSIS — E538 Deficiency of other specified B group vitamins: Secondary | ICD-10-CM | POA: Diagnosis not present

## 2017-12-26 DIAGNOSIS — R7989 Other specified abnormal findings of blood chemistry: Secondary | ICD-10-CM

## 2017-12-26 LAB — CBC WITH DIFFERENTIAL/PLATELET
BASOS ABS: 0.1 10*3/uL (ref 0.0–0.1)
Basophils Relative: 0.7 % (ref 0.0–3.0)
Eosinophils Absolute: 0.3 10*3/uL (ref 0.0–0.7)
Eosinophils Relative: 3.4 % (ref 0.0–5.0)
HEMATOCRIT: 48.3 % — AB (ref 36.0–46.0)
HEMOGLOBIN: 16.2 g/dL — AB (ref 12.0–15.0)
LYMPHS PCT: 28.1 % (ref 12.0–46.0)
Lymphs Abs: 2.2 10*3/uL (ref 0.7–4.0)
MCHC: 33.6 g/dL (ref 30.0–36.0)
MCV: 100.8 fl — ABNORMAL HIGH (ref 78.0–100.0)
Monocytes Absolute: 1 10*3/uL (ref 0.1–1.0)
Monocytes Relative: 12.6 % — ABNORMAL HIGH (ref 3.0–12.0)
NEUTROS ABS: 4.3 10*3/uL (ref 1.4–7.7)
Neutrophils Relative %: 55.2 % (ref 43.0–77.0)
Platelets: 227 10*3/uL (ref 150.0–400.0)
RBC: 4.8 Mil/uL (ref 3.87–5.11)
RDW: 12.9 % (ref 11.5–15.5)
WBC: 7.7 10*3/uL (ref 4.0–10.5)

## 2017-12-26 LAB — TSH: TSH: 7.41 u[IU]/mL — ABNORMAL HIGH (ref 0.35–4.50)

## 2017-12-26 LAB — T4, FREE: Free T4: 0.82 ng/dL (ref 0.60–1.60)

## 2017-12-26 LAB — B12 AND FOLATE PANEL
Folate: 6.7 ng/mL (ref >5.9)
VITAMIN B 12: 440 pg/mL (ref 211–911)

## 2017-12-26 NOTE — Patient Instructions (Signed)
Vitamin B12 Deficiency Vitamin B12 deficiency means that your body is not getting enough vitamin B12. Your body needs vitamin B12 for important bodily functions. If you do not have enough vitamin B12 in your body, you can have health problems. Follow these instructions at home:  Take supplements only as told by your doctor. Follow the directions carefully.  Get any shots (injections) as told by your doctor. Do not miss your visits to the doctor.  Eat lots of healthy foods that contain vitamin B12. Ask your doctor if you should work with someone who is trained in how food affects health (dietitian). Foods that contain vitamin B12 include: ? Meat. ? Meat from birds (poultry). ? Fish. ? Eggs. ? Cereal and dairy products that are fortified. This means that vitamin B12 has been added to the food. Check the label on the package to see if the food is fortified.  Do not drink too much (do not abuse) alcohol.  Keep all follow-up visits as told by your doctor. This is important. Contact a doctor if:  Your symptoms come back. Get help right away if:  You have trouble breathing.  You have chest pain.  You get dizzy.  You pass out (lose consciousness). This information is not intended to replace advice given to you by your health care provider. Make sure you discuss any questions you have with your health care provider. Document Released: 03/02/2011 Document Revised: 08/19/2015 Document Reviewed: 07/29/2014 Elsevier Interactive Patient Education  2018 [employer]vier Inc.  

## 2017-12-26 NOTE — Progress Notes (Signed)
Laura Steele , Aug 09, 1975, 42 y.o., female MRN: [id number] Patient Care Team    Relationship Specialty Notifications Start End  Ma Hillock, DO PCP - General Family Medicine  10/22/15   Ashok Pall, MD Consulting Physician Neurosurgery  06/22/17   Silverio Decamp, MD Consulting Physician Family Medicine  06/22/17     Chief Complaint  Patient presents with  . Follow-up    B12 and thyroid     Subjective:  Macrocytosis without anemia/B12 deficiency/Folate deficiency She has been refraining from alcohol and taking her B-complex vitamin. No cytopenia in cell lines.   Abnormal TSH Last collection ~6.5. She does endorse increase in fatigue.  Depression screen [facility] 2/9 10/24/2017 06/22/2017 12/18/2016 12/04/2016 06/09/2016  Decreased Interest 0 1 0 - 1  Down, Depressed, Hopeless 0 0 0 0 0  PHQ - 2 Score 0 1 0 0 1  Altered sleeping - 2 - 0 1  Tired, decreased energy - 3 - 0 2  Change in appetite - 2 - 0 0  Feeling bad or failure about yourself  - 0 - 0 0  Trouble concentrating - 0 - 0 0  Moving slowly or fidgety/restless - 0 - 0 1  Suicidal thoughts - 0 - 0 0  PHQ-9 Score - 8 - 0 5  Difficult doing work/chores - Somewhat difficult - - -    No Known Allergies Social History   Tobacco Use  . Smoking status: Current Every Day Smoker    Packs/day: 0.50    Types: Cigarettes  . Smokeless tobacco: Never Used  Substance Use Topics  . Alcohol use: Yes    Comment: Pt states was drinkning 40 0z of beer a night. but stopped since sunday.   Past Medical History:  Diagnosis Date  . Allergy   . Anxiety   . Hypoglycemia    Past Surgical History:  Procedure Laterality Date  . ABDOMINAL HYSTERECTOMY    . ANTERIOR CERVICAL DISCECTOMY  03/2017   C6-7; Dr. Christella Noa   Family History  Problem Relation Age of Onset  . Diabetes Mother   . Breast cancer Mother        had masectomy.   . Skin cancer Maternal Grandfather    Allergies as of 12/26/2017   No Known Allergies     Medication List        Accurate as of 12/26/17  9:37 AM. Always use your most recent med list.          B-complex with vitamin C tablet Take 1 tablet by mouth daily.   Baclofen 5 MG Tabs Take 5 mg by mouth 3 (three) times daily as needed.   DULoxetine 30 MG capsule Commonly known as:  CYMBALTA Take 1 capsule (30 mg total) by mouth daily.   glycopyrrolate 1 MG tablet Commonly known as:  ROBINUL Take 1 tablet (1 mg total) by mouth daily.   hydrOXYzine 25 MG tablet Commonly known as:  ATARAX/VISTARIL 1-2 tabs PO at bedtime       All past medical history, surgical history, allergies, family history, immunizations andmedications were updated in the EMR today and reviewed under the history and medication portions of their EMR.     ROS: Negative, with the exception of above mentioned in HPI   Objective:  BP 135/85 (BP Location: Right Arm, Patient Position: Sitting, Cuff Size: Normal)   Pulse 84   Temp 98.3 F (36.8 C)   Resp 20   Ht 5\' 3"  (1.6 m)  Wt 138 lb (62.6 kg)   SpO2 99%   BMI 24.45 kg/m  Body mass index is 24.45 kg/m. Gen: Afebrile. No acute distress. Nontoxic in appearance, well developed, well nourished.  HENT: AT. [place].  MMM Eyes:Pupils Equal Round Reactive to light, Extraocular movements intact,  Conjunctiva without redness, discharge or icterus. Neck/lymp/endocrine: Supple,no lymphadenopathy, no thyromegaly.  CV: RRR no murmur, no edema Chest: CTAB, no wheeze or crackles. Good air movement, normal resp effort.  Skin: no rashes, purpura or petechiae.  Neuro:  Normal gait. PERLA. EOMi. Alert. Oriented x3 . Psych: Normal affect, dress and demeanor. Normal speech. Normal thought content and judgment.  No exam data present No results found. No results found for this or any previous visit (from the past 24 hour(s)).  Assessment/Plan: Laura Steele is a 42 y.o. female present for OV for  Macrocytosis without anemia - possibly multifactorial. No other  cell lines involved. She has been taking B-complex and refraining for any ETOH.  - CBC w/Diff - B12 and Folate Panel - TSH  B12 deficiency/Folate deficiency - likely at least partial cause cause of her macrocytosis.  - continue daily B-Complex - B12 and Folate Panel  Abnormal TSH Will add synthroid if not improved and appropriate - TSH - T4, free - Thyroid peroxidase antibody  F/u dependent on labs    Reviewed expectations re: course of current medical issues.  Discussed self-management of symptoms.  Outlined signs and symptoms indicating need for more acute intervention.  Patient verbalized understanding and all questions were answered.  Patient received an After-Visit Summary.    Orders Placed This Encounter  Procedures  . CBC w/Diff  . B12 and Folate Panel  . TSH  . T4, free  . Thyroid peroxidase antibody    > 15 minutes spent with patient, >50% of time spent face to face counseling   Note is dictated utilizing voice recognition software. Although note has been proof read prior to signing, occasional typographical errors still can be missed. If any questions arise, please do not hesitate to call for verification.   electronically signed by:  Howard Pouch, DO  [place]

## 2017-12-27 ENCOUNTER — Other Ambulatory Visit: Payer: Self-pay | Admitting: Family Medicine

## 2017-12-27 LAB — THYROID PEROXIDASE ANTIBODY

## 2017-12-27 MED ORDER — DULOXETINE HCL 30 MG PO CPEP: 30.0000 mg | ORAL_CAPSULE | Freq: Every day | ORAL | 0 refills | Status: DC

## 2017-12-28 ENCOUNTER — Telehealth: Payer: Self-pay | Admitting: Family Medicine

## 2017-12-28 MED ORDER — LEVOTHYROXINE SODIUM 25 MCG PO TABS: 25.0000 ug | ORAL_TABLET | Freq: Every day | ORAL | 0 refills | Status: DC

## 2017-12-28 NOTE — Telephone Encounter (Signed)
Please inform patient the following information: Continue B-complex daily to keep levels in normal range, her b12 level and folate levels are in low normal range now. is in normal range now.  Her large RBC are starting to return normal and are just barely I the abnormal range now. Much better.  Her TSH is still climbing slowly now 7.5. Since she is reporting hypothyroid-like symptoms, I would suggest starting a low dose of thyroid replacement which needs to be taken in the morning, on an empty stomach for at least 17m-1 hour before eating.   F/U needed in 8 weeks to recheck thyroid levels and adjust dose if needed. It takes ~8 weeks to see the change after meds.  Please make provider appt.

## 2017-12-28 NOTE — Telephone Encounter (Signed)
Left detailed message with results and instructions on patient voice mail per DPR instructed patient to call and schedule follow up appointment for 8 weeks after starting medication.

## 2018-01-17 ENCOUNTER — Ambulatory Visit (INDEPENDENT_AMBULATORY_CARE_PROVIDER_SITE_OTHER): Payer: 59 | Admitting: Sports Medicine

## 2018-01-17 ENCOUNTER — Ambulatory Visit (INDEPENDENT_AMBULATORY_CARE_PROVIDER_SITE_OTHER): Payer: 59

## 2018-01-17 ENCOUNTER — Encounter: Payer: Self-pay | Admitting: Sports Medicine

## 2018-01-17 DIAGNOSIS — M542 Cervicalgia: Secondary | ICD-10-CM | POA: Diagnosis not present

## 2018-01-17 DIAGNOSIS — G8929 Other chronic pain: Secondary | ICD-10-CM

## 2018-01-17 DIAGNOSIS — M5030 Other cervical disc degeneration, unspecified cervical region: Secondary | ICD-10-CM

## 2018-01-17 DIAGNOSIS — G5603 Carpal tunnel syndrome, bilateral upper limbs: Secondary | ICD-10-CM

## 2018-01-17 DIAGNOSIS — M25512 Pain in left shoulder: Secondary | ICD-10-CM

## 2018-01-17 HISTORY — DX: Carpal tunnel syndrome, bilateral upper limbs: G56.03

## 2018-01-17 MED ORDER — MELOXICAM 15 MG PO TABS: ORAL_TABLET | ORAL | 3 refills | Status: DC

## 2018-01-17 NOTE — Assessment & Plan Note (Signed)
Patient will do bilateral wrist splinting when sleeping. Return in 1 month, bilateral Hydro dissection if no better.

## 2018-01-17 NOTE — Assessment & Plan Note (Signed)
I do suspect some of her shoulder pain is impingement related. Shoulder x-rays, meloxicam, adding formal PT. Return in 6 weeks, subacromial injection if no better.

## 2018-01-17 NOTE — Assessment & Plan Note (Addendum)
Status post C6-C7 disc replacement. Mild neck and upper shoulder pain on the left, bilateral hand numbness. MRI with and without contrast. BMP.  Left cervical epidural ordered, MRI shows possible left C5 nerve root impingement. Return to see me 1 month after injection.

## 2018-01-17 NOTE — Progress Notes (Addendum)
Subjective:    I'm seeing this patient as a consultation for: Dr. Howard Pouch  CC: Left shoulder pain, neck pain, hand numbness  HPI: Neck pain: History of C6-C7 disc replacement with Dr. Christella Noa, minimal pain.  The surgery was last year.  Shoulder pain: Left-sided, localized over the deltoid, worse with overhead activities.  No trauma.  Hand numbness and tingling: Numbness and tingling in the thumb through the middle finger.  Worse when she goes to sleep.  She does have some splints at home that she is not wearing at night.  I reviewed the past medical history, family history, social history, surgical history, and allergies today and no changes were needed.  Please see the problem list section below in epic for further details.  Past Medical History: Past Medical History:  Diagnosis Date  . Allergy   . Anxiety   . Hypoglycemia    Past Surgical History: Past Surgical History:  Procedure Laterality Date  . ABDOMINAL HYSTERECTOMY    . ANTERIOR CERVICAL DISCECTOMY  03/2017   C6-7; Dr. Christella Noa   Social History: Social History   Socioeconomic History  . Marital status: Married    Spouse name: Not on file  . Number of children: Not on file  . Years of education: Not on file  . Highest education level: Not on file  Occupational History  . Not on file  Social Needs  . Financial resource strain: Not on file  . Food insecurity:    Worry: Not on file    Inability: Not on file  . Transportation needs:    Medical: Not on file    Non-medical: Not on file  Tobacco Use  . Smoking status: Current Every Day Smoker    Packs/day: 0.50    Types: Cigarettes  . Smokeless tobacco: Never Used  Substance and Sexual Activity  . Alcohol use: Yes    Comment: Pt states was drinkning 40 0z of beer a night. but stopped since sunday.  . Drug use: No  . Sexual activity: Yes  Lifestyle  . Physical activity:    Days per week: Not on file    Minutes per session: Not on file  . Stress:  Not on file  Relationships  . Social connections:    Talks on phone: Not on file    Gets together: Not on file    Attends religious service: Not on file    Active member of club or organization: Not on file    Attends meetings of clubs or organizations: Not on file    Relationship status: Not on file  Other Topics Concern  . Not on file  Social History Narrative  . Not on file   Family History: Family History  Problem Relation Age of Onset  . Diabetes Mother   . Breast cancer Mother        had masectomy.   . Skin cancer Maternal Grandfather    Allergies: No Known Allergies Medications: See med rec.  Review of Systems: No headache, visual changes, nausea, vomiting, diarrhea, constipation, dizziness, abdominal pain, skin rash, fevers, chills, night sweats, weight loss, swollen lymph nodes, body aches, joint swelling, muscle aches, chest pain, shortness of breath, mood changes, visual or auditory hallucinations.   Objective:   General: Well Developed, well nourished, and in no acute distress.  Neuro:  Extra-ocular muscles intact, able to move all 4 extremities, sensation grossly intact.  Deep tendon reflexes tested were normal. Psych: Alert and oriented, mood congruent with affect.  ENT:  Ears and nose appear unremarkable.  Hearing grossly normal. Neck: Unremarkable overall appearance, trachea midline.  No visible thyroid enlargement. Eyes: Conjunctivae and lids appear unremarkable.  Pupils equal and round. Skin: Warm and dry, no rashes noted.  Cardiovascular: Pulses palpable, no extremity edema. Left shoulder: Inspection reveals no abnormalities, atrophy or asymmetry. Palpation is normal with no tenderness over AC joint or bicipital groove. ROM is full in all planes. Rotator cuff strength normal throughout. Positive Neer and Hawkin's tests, empty can. Speeds and Yergason's tests normal. No labral pathology noted with negative Obrien's, negative crank, negative clunk, and good  stability. Normal scapular function observed. No painful arc and no drop arm sign. No apprehension sign Bilateral wrist: Inspection normal with no visible erythema or swelling. ROM smooth and normal with good flexion and extension and ulnar/radial deviation that is symmetrical with opposite wrist. Palpation is normal over metacarpals, navicular, lunate, and TFCC; tendons without tenderness/ swelling No snuffbox tenderness. No tenderness over Canal of Guyon. Strength 5/5 in all directions without pain. Positive bilateral Tinel's and Phalen signs. Negative Finkelstein sign. Negative Watson's test.  Impression and Recommendations:   This case required medical decision making of moderate complexity.  Carpal tunnel syndrome, bilateral Patient will do bilateral wrist splinting when sleeping. Return in 1 month, bilateral Hydro dissection if no better.  DDD (degenerative disc disease), cervical Status post C6-C7 disc replacement. Mild neck and upper shoulder pain on the left, bilateral hand numbness. MRI with and without contrast. BMP.  Left cervical epidural ordered, MRI shows possible left C5 nerve root impingement. Return to see me 1 month after injection.  Left shoulder pain I do suspect some of her shoulder pain is impingement related. Shoulder x-rays, meloxicam, adding formal PT. Return in 6 weeks, subacromial injection if no better. ___________________________________________ Gwen Her. Dianah Field, M.D., ABFM., CAQSM. Primary Care and Sports Medicine [facility]  Adjunct Professor of [place] [employer] [facility] [employer]

## 2018-01-18 ENCOUNTER — Telehealth: Payer: Self-pay | Admitting: Sports Medicine

## 2018-01-18 LAB — BASIC METABOLIC PANEL
BUN: 6 mg/dL — ABNORMAL LOW (ref 7–25)
Calcium: 9 mg/dL (ref 8.6–10.2)
Creat: 0.63 mg/dL (ref 0.50–1.10)
Glucose, Bld: 107 mg/dL — ABNORMAL HIGH (ref 65–99)

## 2018-01-18 LAB — BASIC METABOLIC PANEL WITH GFR
BUN/Creatinine Ratio: 10 (calc) (ref 6–22)
CO2: 27 mmol/L (ref 20–32)
Chloride: 103 mmol/L (ref 98–110)
Potassium: 3.6 mmol/L (ref 3.5–5.3)
Sodium: 138 mmol/L (ref 135–146)

## 2018-01-18 MED ORDER — DIAZEPAM 5 MG PO TABS: ORAL_TABLET | ORAL | 0 refills | Status: DC

## 2018-01-18 NOTE — Telephone Encounter (Signed)
Pt needs pre-meds before MRI, routing.

## 2018-01-18 NOTE — Telephone Encounter (Signed)
Valium sent in.  Needs a driver.

## 2018-01-21 ENCOUNTER — Ambulatory Visit: Payer: Self-pay | Admitting: Physical Therapy

## 2018-01-21 NOTE — Telephone Encounter (Signed)
Left VM with status update.  

## 2018-01-30 ENCOUNTER — Ambulatory Visit: Payer: Self-pay | Admitting: Physical Therapy

## 2018-02-04 ENCOUNTER — Ambulatory Visit (INDEPENDENT_AMBULATORY_CARE_PROVIDER_SITE_OTHER): Payer: 59

## 2018-02-04 DIAGNOSIS — M50222 Other cervical disc displacement at C5-C6 level: Secondary | ICD-10-CM

## 2018-02-04 MED ORDER — GADOBUTROL 1 MMOL/ML IV SOLN
6.0000 mL | Freq: Once | INTRAVENOUS | Status: AC | PRN
  Administered 2018-02-04: 6 mL via INTRAVENOUS

## 2018-02-04 NOTE — Progress Notes (Signed)
Pt has seen results on MyChart and message also sent for patient to call back if any questions.

## 2018-02-07 NOTE — Addendum Note (Signed)
Addended by: Silverio Decamp on: 02/07/2018 04:54 PM   Modules accepted: Orders

## 2018-02-19 ENCOUNTER — Ambulatory Visit
Admission: RE | Admit: 2018-02-19 | Discharge: 2018-02-19 | Disposition: A | Discharge status: Home / Self Care | Payer: 59 | Source: Ambulatory Visit | Attending: Sports Medicine | Admitting: Sports Medicine

## 2018-02-19 MED ORDER — IOPAMIDOL (ISOVUE-M 300) INJECTION 61%
1.0000 mL | Freq: Once | INTRAMUSCULAR | Status: AC
  Administered 2018-02-19: 1 mL via EPIDURAL

## 2018-02-19 MED ORDER — TRIAMCINOLONE ACETONIDE 40 MG/ML IJ SUSP (RADIOLOGY)
60.0000 mg | Freq: Once | INTRAMUSCULAR | Status: AC
  Administered 2018-02-19: 60 mg via EPIDURAL

## 2018-02-19 NOTE — Discharge Instructions (Signed)

## 2018-02-25 ENCOUNTER — Encounter: Payer: Self-pay | Admitting: Sports Medicine

## 2018-02-26 ENCOUNTER — Encounter: Payer: Self-pay | Admitting: Sports Medicine

## 2018-02-26 MED ORDER — HYDROCODONE-ACETAMINOPHEN 5-325 MG PO TABS: 1.0000 | ORAL_TABLET | Freq: Three times a day (TID) | ORAL | 0 refills | Status: DC | PRN

## 2018-02-26 MED ORDER — PREDNISONE 50 MG PO TABS: ORAL_TABLET | ORAL | 0 refills | Status: DC

## 2018-02-26 NOTE — Addendum Note (Signed)
Addended by: Silverio Decamp on: 02/26/2018 11:32 AM   Modules accepted: Orders

## 2018-03-01 ENCOUNTER — Ambulatory Visit: Payer: Self-pay | Admitting: Family Medicine

## 2018-03-01 DIAGNOSIS — Z0289 Encounter for other administrative examinations: Secondary | ICD-10-CM

## 2018-03-06 ENCOUNTER — Ambulatory Visit (INDEPENDENT_AMBULATORY_CARE_PROVIDER_SITE_OTHER): Payer: 59 | Admitting: Sports Medicine

## 2018-03-06 ENCOUNTER — Encounter: Payer: Self-pay | Admitting: Sports Medicine

## 2018-03-06 DIAGNOSIS — M5030 Other cervical disc degeneration, unspecified cervical region: Secondary | ICD-10-CM | POA: Diagnosis not present

## 2018-03-06 DIAGNOSIS — G5603 Carpal tunnel syndrome, bilateral upper limbs: Secondary | ICD-10-CM

## 2018-03-06 DIAGNOSIS — M25512 Pain in left shoulder: Secondary | ICD-10-CM | POA: Diagnosis not present

## 2018-03-06 DIAGNOSIS — G8929 Other chronic pain: Secondary | ICD-10-CM

## 2018-03-06 MED ORDER — GABAPENTIN 300 MG PO CAPS: ORAL_CAPSULE | ORAL | 3 refills | Status: DC

## 2018-03-06 NOTE — Assessment & Plan Note (Addendum)
Status post C6-C7 disc replacement. Left mid cervical radicular symptoms. Bilateral hand numbness. MRI shows possible left C5 nerve root impingement, she has not really improved all that much after a left cervical epidural. Persistent symptoms when going to bed, adding gabapentin in a slow up taper. 1 in a month to reevaluate symptoms. Ultimately if we maximize medical management we may refer her back to Dr. Christella Noa for an additional surgical opinion though I do not see any surgical pathology on her MRI.

## 2018-03-06 NOTE — Assessment & Plan Note (Signed)
Well-controlled when wearing her nighttime splints. No hydrodissection needed at this time.

## 2018-03-06 NOTE — Assessment & Plan Note (Signed)
For the most part resolved, pain improves with abduction of the shoulder as is typical for a radicular cause of the pain.

## 2018-03-06 NOTE — Progress Notes (Signed)
Subjective:    CC: Follow-up  HPI: Left cervical radiculitis: With possible left C5 nerve root impingement on MRI, albeit mild.  We proceeded with a cervical epidural, did not really get a whole lot of relief.  Ultimately needed a second burst of prednisone and some hydrocodone.  Symptoms are mostly when going to bed, left-sided neck pain, left upper chest pain and radiation down the left arm.  No progressive weakness, no constitutional symptoms.  Bilateral carpal tunnel syndrome: Well-controlled when she wears her splints while sleeping.  Left subacromial bursitis: Mostly resolved, she reports her symptoms are now better with abduction of the left shoulder which is consistent more with a radicular origin.  I reviewed the past medical history, family history, social history, surgical history, and allergies today and no changes were needed.  Please see the problem list section below in epic for further details.  Past Medical History: Past Medical History:  Diagnosis Date  . Allergy   . Anxiety   . Hypoglycemia    Past Surgical History: Past Surgical History:  Procedure Laterality Date  . ABDOMINAL HYSTERECTOMY    . ANTERIOR CERVICAL DISCECTOMY  03/2017   C6-7; Dr. Christella Noa   Social History: Social History   Socioeconomic History  . Marital status: Married    Spouse name: Not on file  . Number of children: Not on file  . Years of education: Not on file  . Highest education level: Not on file  Occupational History  . Not on file  Social Needs  . Financial resource strain: Not on file  . Food insecurity:    Worry: Not on file    Inability: Not on file  . Transportation needs:    Medical: Not on file    Non-medical: Not on file  Tobacco Use  . Smoking status: Current Every Day Smoker    Packs/day: 0.50    Types: Cigarettes  . Smokeless tobacco: Never Used  Substance and Sexual Activity  . Alcohol use: Yes    Comment: Pt states was drinkning 40 0z of beer a night. but  stopped since sunday.  . Drug use: No  . Sexual activity: Yes  Lifestyle  . Physical activity:    Days per week: Not on file    Minutes per session: Not on file  . Stress: Not on file  Relationships  . Social connections:    Talks on phone: Not on file    Gets together: Not on file    Attends religious service: Not on file    Active member of club or organization: Not on file    Attends meetings of clubs or organizations: Not on file    Relationship status: Not on file  Other Topics Concern  . Not on file  Social History Narrative  . Not on file   Family History: Family History  Problem Relation Age of Onset  . Diabetes Mother   . Breast cancer Mother        had masectomy.   . Skin cancer Maternal Grandfather    Allergies: No Known Allergies Medications: See med rec.  Review of Systems: No fevers, chills, night sweats, weight loss, chest pain, or shortness of breath.   Objective:    General: Well Developed, well nourished, and in no acute distress.  Neuro: Alert and oriented x3, extra-ocular muscles intact, sensation grossly intact.  HEENT: Normocephalic, atraumatic, pupils equal round reactive to light, neck supple, no masses, no lymphadenopathy, thyroid nonpalpable.  Skin: Warm and dry,  no rashes. Cardiac: Regular rate and rhythm, no murmurs rubs or gallops, no lower extremity edema.  Respiratory: Clear to auscultation bilaterally. Not using accessory muscles, speaking in full sentences.  Impression and Recommendations:    DDD (degenerative disc disease), cervical Status post C6-C7 disc replacement. Left mid cervical radicular symptoms. Bilateral hand numbness. MRI shows possible left C5 nerve root impingement, she has not really improved all that much after a left cervical epidural. Persistent symptoms when going to bed, adding gabapentin in a slow up taper. 1 in a month to reevaluate symptoms. Ultimately if we maximize medical management we may refer her back  to Dr. Christella Noa for an additional surgical opinion though I do not see any surgical pathology on her MRI.  Left shoulder pain For the most part resolved, pain improves with abduction of the shoulder as is typical for a radicular cause of the pain.  Carpal tunnel syndrome, bilateral Well-controlled when wearing her nighttime splints. No hydrodissection needed at this time. __________________________________________ Gwen Her. Dianah Field, M.D., ABFM., CAQSM. Primary Care and Sports Medicine [facility]  Adjunct Professor of [place] [employer] [facility] [employer] Medicine

## 2018-03-14 ENCOUNTER — Other Ambulatory Visit: Payer: Self-pay | Admitting: Sports Medicine

## 2018-03-21 ENCOUNTER — Other Ambulatory Visit: Payer: Self-pay | Admitting: Family Medicine

## 2018-03-21 DIAGNOSIS — Z1231 Encounter for screening mammogram for malignant neoplasm of breast: Secondary | ICD-10-CM

## 2018-03-28 ENCOUNTER — Encounter: Payer: Self-pay | Admitting: *Deleted

## 2018-04-01 ENCOUNTER — Telehealth: Payer: Self-pay | Admitting: Family Medicine

## 2018-04-01 ENCOUNTER — Encounter: Payer: Self-pay | Admitting: Family Medicine

## 2018-04-01 ENCOUNTER — Other Ambulatory Visit: Payer: Self-pay

## 2018-04-01 ENCOUNTER — Ambulatory Visit (INDEPENDENT_AMBULATORY_CARE_PROVIDER_SITE_OTHER): Payer: 59 | Admitting: Family Medicine

## 2018-04-01 VITALS — BP 121/75 | HR 101 | Temp 98.9°F | Resp 14 | Ht 63.0 in | Wt 142.0 lb

## 2018-04-01 DIAGNOSIS — E039 Hypothyroidism, unspecified: Secondary | ICD-10-CM | POA: Diagnosis not present

## 2018-04-01 DIAGNOSIS — F419 Anxiety disorder, unspecified: Secondary | ICD-10-CM

## 2018-04-01 LAB — TSH: TSH: 1.79 u[IU]/mL (ref 0.35–4.50)

## 2018-04-01 MED ORDER — HYDROXYZINE HCL 25 MG PO TABS: ORAL_TABLET | ORAL | 5 refills | Status: DC

## 2018-04-01 MED ORDER — DULOXETINE HCL 30 MG PO CPEP: 30.0000 mg | ORAL_CAPSULE | Freq: Every day | ORAL | 0 refills | Status: DC

## 2018-04-01 MED ORDER — LEVOTHYROXINE SODIUM 25 MCG PO TABS: 25.0000 ug | ORAL_TABLET | Freq: Every day | ORAL | 3 refills | Status: DC

## 2018-04-01 NOTE — Progress Notes (Addendum)
Laura Steele , [birth year], 43 y.o., female MRN: [id number] Patient Care Team    Relationship Specialty Notifications Start End  Ma Hillock, DO PCP - General Family Medicine  10/22/15   Ashok Pall, MD Consulting Physician Neurosurgery  06/22/17   Silverio Decamp, MD Consulting Physician Family Medicine  06/22/17     Chief Complaint  Patient presents with  . Follow-up     Subjective:   Hypothyroidism:  Last collection 7.41- tolerating levothyroxine 25 mcg, no diarrhea or flushing.   Anxiety:  Doing well on Cymbalta.   Depression screen [facility] 2/9 10/24/2017 06/22/2017 12/18/2016 12/04/2016 06/09/2016  Decreased Interest 0 1 0 - 1  Down, Depressed, Hopeless 0 0 0 0 0  PHQ - 2 Score 0 1 0 0 1  Altered sleeping - 2 - 0 1  Tired, decreased energy - 3 - 0 2  Change in appetite - 2 - 0 0  Feeling bad or failure about yourself  - 0 - 0 0  Trouble concentrating - 0 - 0 0  Moving slowly or fidgety/restless - 0 - 0 1  Suicidal thoughts - 0 - 0 0  PHQ-9 Score - 8 - 0 5  Difficult doing work/chores - Somewhat difficult - - -    No Known Allergies Social History   Tobacco Use  . Smoking status: Current Every Day Smoker    Packs/day: 0.50    Types: Cigarettes  . Smokeless tobacco: Never Used  Substance Use Topics  . Alcohol use: Yes    Comment: Pt states was drinkning 40 0z of beer a night. but stopped since sunday.   Past Medical History:  Diagnosis Date  . Allergy   . Anxiety   . Hypoglycemia    Past Surgical History:  Procedure Laterality Date  . ABDOMINAL HYSTERECTOMY    . ANTERIOR CERVICAL DISCECTOMY  03/2017   C6-7; Dr. Christella Noa   Family History  Problem Relation Age of Onset  . Diabetes Mother   . Breast cancer Mother        had masectomy.   . Skin cancer Maternal Grandfather    Allergies as of 04/01/2018   No Known Allergies     Medication List       Accurate as of April 01, 2018  8:26 AM. Always use your most recent med list.          B-complex with vitamin C tablet Take 1 tablet by mouth daily.   Baclofen 5 MG Tabs Take 5 mg by mouth 3 (three) times daily as needed.   DULoxetine 30 MG capsule Commonly known as:  CYMBALTA Take 1 capsule (30 mg total) by mouth daily.   gabapentin 300 MG capsule Commonly known as:  NEURONTIN One tab PO qHS for a week, then BID for a week, then TID. May double weekly to a max of 3,600mg /day   glycopyrrolate 1 MG tablet Commonly known as:  ROBINUL Take 1 tablet (1 mg total) by mouth daily.   HYDROcodone-acetaminophen 5-325 MG tablet Commonly known as:  NORCO/VICODIN Take 1 tablet by mouth every 8 (eight) hours as needed for moderate pain.   hydrOXYzine 25 MG tablet Commonly known as:  ATARAX/VISTARIL 1-2 tabs PO at bedtime   levothyroxine 25 MCG tablet Commonly known as:  SYNTHROID, LEVOTHROID Take 1 tablet (25 mcg total) by mouth daily.   meloxicam 15 MG tablet Commonly known as:  MOBIC One tab PO qAM with breakfast for 2 weeks, then daily prn pain.  All past medical history, surgical history, allergies, family history, immunizations andmedications were updated in the EMR today and reviewed under the history and medication portions of their EMR.     ROS: Negative, with the exception of above mentioned in HPI   Objective:  BP 121/75   Pulse (!) 101   Temp 98.9 F (37.2 C) (Oral)   Resp 14   Ht 5\' 3"  (1.6 m)   Wt 142 lb (64.4 kg)   SpO2 96%   BMI 25.15 kg/m  Body mass index is 25.15 kg/m. Gen: Afebrile. No acute distress.  HENT: AT. [place].  MMM.  Eyes:Pupils Equal Round Reactive to light, Extraocular movements intact,  Conjunctiva without redness, discharge or icterus. Neck/lymp/endocrine: Supple,no  lymphadenopathy, no thyromegaly CV: mildly tachy- just smoked a cigarette.  Chest: CTAB, no wheeze or crackles Neuro: Normal gait. PERLA. EOMi. Alert. Oriented. Psych: Normal affect, dress and demeanor. Normal speech. Normal thought content and judgment.    No exam data present No results found. No results found for this or any previous visit (from the past 24 hour(s)).  Assessment/Plan: Laura Steele is a 43 y.o. female present for OV for  hypothyroid - labs collected today- will refill med after results to ensure proper dose.  - TSH - T4, free - Thyroid peroxidase antibody - F/U PRN- will follow once yearly once stable dose  Anxiety:  - stable. Refilled Cymbalta and vistaril keep routine appt.q 6 mos   Reviewed expectations re: course of current medical issues.  Discussed self-management of symptoms.  Outlined signs and symptoms indicating need for more acute intervention.  Patient verbalized understanding and all questions were answered.  Patient received an After-Visit Summary.   > 15 minutes spent with patient, >50% of time spent face to face   No orders of the defined types were placed in this encounter.   > 15 minutes spent with patient, >50% of time spent face to face counseling   Note is dictated utilizing voice recognition software. Although note has been proof read prior to signing, occasional typographical errors still can be missed. If any questions arise, please do not hesitate to call for verification.   electronically signed by:  Howard Pouch, DO  [place]

## 2018-04-01 NOTE — Addendum Note (Signed)
Addended by: Howard Pouch A on: 04/01/2018 08:42 AM   Modules accepted: Orders

## 2018-04-01 NOTE — Telephone Encounter (Signed)
Please inform patient the following information: Thyroid function is now normal. Medication refilled at current dose.

## 2018-04-01 NOTE — Patient Instructions (Signed)
We will call in med tomorrow after lab results.   Hypothyroidism  Hypothyroidism is when the thyroid gland does not make enough of certain hormones (it is underactive). The thyroid gland is a small gland located in the lower front part of the neck, just in front of the windpipe (trachea). This gland makes hormones that help control how the body uses food for energy (metabolism) as well as how the heart and brain function. These hormones also play a role in keeping your bones strong. When the thyroid is underactive, it produces too little of the hormones thyroxine (T4) and triiodothyronine (T3). What are the causes? This condition may be caused by:  Hashimoto's disease. This is a disease in which the body's disease-fighting system (immune system) attacks the thyroid gland. This is the most common cause.  Viral infections.  Pregnancy.  Certain medicines.  Birth defects.  Past radiation treatments to the head or neck for cancer.  Past treatment with radioactive iodine.  Past exposure to radiation in the environment.  Past surgical removal of part or all of the thyroid.  Problems with a gland in the center of the brain (pituitary gland).  Lack of enough iodine in the diet. What increases the risk? You are more likely to develop this condition if:  You are female.  You have a family history of thyroid conditions.  You use a medicine called lithium.  You take medicines that affect the immune system (immunosuppressants). What are the signs or symptoms? Symptoms of this condition include:  Feeling as though you have no energy (lethargy).  Not being able to tolerate cold.  Weight gain that is not explained by a change in diet or exercise habits.  Lack of appetite.  Dry skin.  Coarse hair.  Menstrual irregularity.  Slowing of thought processes.  Constipation.  Sadness or depression. How is this diagnosed? This condition may be diagnosed based on:  Your symptoms,  your medical history, and a physical exam.  Blood tests. You may also have imaging tests, such as an ultrasound or MRI. How is this treated? This condition is treated with medicine that replaces the thyroid hormones that your body does not make. After you begin treatment, it may take several weeks for symptoms to go away. Follow these instructions at home:  Take over-the-counter and prescription medicines only as told by your health care provider.  If you start taking any new medicines, tell your health care provider.  Keep all follow-up visits as told by your health care provider. This is important. ? As your condition improves, your dosage of thyroid hormone medicine may change. ? You will need to have blood tests regularly so that your health care provider can monitor your condition. Contact a health care provider if:  Your symptoms do not get better with treatment.  You are taking thyroid replacement medicine and you: ? Sweat a lot. ? Have tremors. ? Feel anxious. ? Lose weight rapidly. ? Cannot tolerate heat. ? Have emotional swings. ? Have diarrhea. ? Feel weak. Get help right away if you have:  Chest pain.  An irregular heartbeat.  A rapid heartbeat.  Difficulty breathing. Summary  Hypothyroidism is when the thyroid gland does not make enough of certain hormones (it is underactive).  When the thyroid is underactive, it produces too little of the hormones thyroxine (T4) and triiodothyronine (T3).  The most common cause is Hashimoto's disease, a disease in which the body's disease-fighting system (immune system) attacks the thyroid gland. The condition  can also be caused by viral infections, medicine, pregnancy, or past radiation treatment to the head or neck.  Symptoms may include weight gain, dry skin, constipation, feeling as though you do not have energy, and not being able to tolerate cold.  This condition is treated with medicine to replace the thyroid  hormones that your body does not make. This information is not intended to replace advice given to you by your health care provider. Make sure you discuss any questions you have with your health care provider. Document Released: 03/13/2005 Document Revised: 02/21/2017 Document Reviewed: 02/21/2017 Elsevier Interactive Patient Education  2019 [employer].  Please help [place] help you:  We are honored you have chosen [facility] for your [facility] home. Below you will find basic instructions that you may need to access in the future. Please help [place] help you by reading the instructions, which cover many of the frequent questions we experience.   Prescription refills and request:  -In order to allow more efficient response time, please call your pharmacy for all refills. They will forward the request electronically to [place]. This allows for the quickest possible response. Request left on a nurse line can take longer to refill, since these are checked as time allows between office patients and other phone calls.  - refill request can take up to 3-5 working days to complete.  - If request is sent electronically and request is appropiate, it is usually completed in 1-2 business days.  - all patients will need to be seen routinely for all chronic medical conditions requiring prescription medications (see follow-up below). If you are overdue for follow up on your condition, you will be asked to make an appointment and we will call in enough medication to cover you until your appointment (up to 30 days).  - all controlled substances will require a face to face visit to request/refill.  - if you desire your prescriptions to go through a new pharmacy, and have an active script at original pharmacy, you will need to call your pharmacy and have scripts transferred to new pharmacy. This is completed between the pharmacy locations and not by your provider.    Results: If any images or labs were ordered, it can  take up to 1 week to get results depending on the test ordered and the lab/facility running and resulting the test. - Normal or stable results, which do not need further discussion, may be released to your mychart immediately with attached note to you. A call may not be generated for normal results. Please make certain to sign up for mychart. If you have questions on how to activate your mychart you can call the front office.  - If your results need further discussion, our office will attempt to contact you via phone, and if unable to reach you after 2 attempts, we will release your abnormal result to your mychart with instructions.  - All results will be automatically released in mychart after 1 week.  - Your provider will provide you with explanation and instruction on all relevant material in your results. Please keep in mind, results and labs may appear confusing or abnormal to the untrained eye, but it does not mean they are actually abnormal for you personally. If you have any questions about your results that are not covered, or you desire more detailed explanation than what was provided, you should make an appointment with your provider to do so.   Our office handles many outgoing and  incoming calls daily. If we have not contacted you within 1 week about your results, please check your mychart to see if there is a message first and if not, then contact our office.  In helping with this matter, you help decrease call volume, and therefore allow [place] to be able to respond to patients needs more efficiently.   Acute office visits (sick visit):  An acute visit is intended for a new problem and are scheduled in shorter time slots to allow schedule openings for patients with new problems. This is the appropriate visit to discuss a new problem. Problems will not be addressed by phone call or Echart message. Appointment is needed if requesting treatment. In order to provide you with excellent quality medical  care with proper time for you to explain your problem, have an exam and receive treatment with instructions, these appointments should be limited to one new problem per visit. If you experience a new problem, in which you desire to be addressed, please make an acute office visit, we save openings on the schedule to accommodate you. Please do not save your new problem for any other type of visit, let us take care of it properly and quickly for you.   Follow up visits:  Depending on your condition(s) your provider will need to see you routinely in order to provide you with quality care and prescribe medication(s). Most chronic conditions (Example: hypertension, Diabetes, depression/anxiety... etc), require visits a couple times a year. Your provider will instruct you on proper follow up for your personal medical conditions and history. Please make certain to make follow up appointments for your condition as instructed. Failing to do so could result in lapse in your medication treatment/refills. If you request a refill, and are overdue to be seen on a condition, we will always provide you with a 30 day script (once) to allow you time to schedule.    Medicare wellness (well visit): - we have a wonderful Nurse Maudie Mercury), that will meet with you and provide you will yearly medicare wellness visits. These visits should occur yearly (can not be scheduled less than 1 calendar year apart) and cover preventive health, immunizations, advance directives and screenings you are entitled to yearly through your medicare benefits. Do not miss out on your entitled benefits, this is when medicare will pay for these benefits to be ordered for you.  These are strongly encouraged by your provider and is the appropriate type of visit to make certain you are up to date with all preventive health benefits. If you have not had your medicare wellness exam in the last 12 months, please make certain to schedule one by calling the office and  schedule your medicare wellness with Maudie Mercury as soon as possible.   Yearly physical (well visit):  - Adults are recommended to be seen yearly for physicals. Check with your insurance and date of your last physical, most insurances require one calendar year between physicals. Physicals include all preventive health topics, screenings, medical exam and labs that are appropriate for gender/age and history. You may have fasting labs needed at this visit. This is a well visit (not a sick visit), new problems should not be covered during this visit (see acute visit).  - Pediatric patients are seen more frequently when they are younger. Your provider will advise you on well child visit timing that is appropriate for your their age. - This is not a medicare wellness visit. Medicare wellness exams do not have an exam  portion to the visit. Some medicare companies allow for a physical, some do not allow a yearly physical. If your medicare allows a yearly physical you can schedule the medicare wellness with our nurse Maudie Mercury and have your physical with your provider after, on the same day. Please check with insurance for your full benefits.   Late Policy/No Shows:  - all new patients should arrive 15-30 minutes earlier than appointment to allow [place] time  to  obtain all personal demographics,  insurance information and for you to complete office paperwork. - All established patients should arrive 10-15 minutes earlier than appointment time to update all information and be checked in .  - In our best efforts to run on time, if you are late for your appointment you will be asked to either reschedule or if able, we will work you back into the schedule. There will be a wait time to work you back in the schedule,  depending on availability.  - If you are unable to make it to your appointment as scheduled, please call 24 hours ahead of time to allow [place] to fill the time slot with someone else who needs to be seen. If you do not  cancel your appointment ahead of time, you may be charged a no show fee.

## 2018-04-02 ENCOUNTER — Ambulatory Visit (INDEPENDENT_AMBULATORY_CARE_PROVIDER_SITE_OTHER): Payer: 59 | Admitting: Sports Medicine

## 2018-04-02 ENCOUNTER — Encounter: Payer: Self-pay | Admitting: Sports Medicine

## 2018-04-02 DIAGNOSIS — M25512 Pain in left shoulder: Secondary | ICD-10-CM

## 2018-04-02 DIAGNOSIS — G8929 Other chronic pain: Secondary | ICD-10-CM

## 2018-04-02 DIAGNOSIS — M5030 Other cervical disc degeneration, unspecified cervical region: Secondary | ICD-10-CM

## 2018-04-02 MED ORDER — GABAPENTIN 100 MG PO CAPS: ORAL_CAPSULE | ORAL | 11 refills | Status: DC

## 2018-04-02 NOTE — Telephone Encounter (Signed)
Patient advised of results and rx refilled, voiced understanding.

## 2018-04-02 NOTE — Progress Notes (Signed)
Subjective:    CC: Follow-up  HPI: This is a pleasant 43 year old female, we been treating her for cervical radiculitis, she has improved considerably, but gabapentin seems to take the majority of her pain away, only has a minimal dull ache during the day.  She has been afraid to take her gabapentin during the day.  I reviewed the past medical history, family history, social history, surgical history, and allergies today and no changes were needed.  Please see the problem list section below in epic for further details.  Past Medical History: Past Medical History:  Diagnosis Date  . Allergy   . Anxiety   . Hypoglycemia    Past Surgical History: Past Surgical History:  Procedure Laterality Date  . ABDOMINAL HYSTERECTOMY    . ANTERIOR CERVICAL DISCECTOMY  03/2017   C6-7; Dr. Christella Noa   Social History: Social History   Socioeconomic History  . Marital status: Married    Spouse name: Not on file  . Number of children: Not on file  . Years of education: Not on file  . Highest education level: Not on file  Occupational History  . Not on file  Social Needs  . Financial resource strain: Not on file  . Food insecurity:    Worry: Not on file    Inability: Not on file  . Transportation needs:    Medical: Not on file    Non-medical: Not on file  Tobacco Use  . Smoking status: Current Every Day Smoker    Packs/day: 0.50    Types: Cigarettes  . Smokeless tobacco: Never Used  Substance and Sexual Activity  . Alcohol use: Yes    Comment: Pt states was drinkning 40 0z of beer a night. but stopped since sunday.  . Drug use: No  . Sexual activity: Yes  Lifestyle  . Physical activity:    Days per week: Not on file    Minutes per session: Not on file  . Stress: Not on file  Relationships  . Social connections:    Talks on phone: Not on file    Gets together: Not on file    Attends religious service: Not on file    Active member of club or organization: Not on file    Attends  meetings of clubs or organizations: Not on file    Relationship status: Not on file  Other Topics Concern  . Not on file  Social History Narrative  . Not on file   Family History: Family History  Problem Relation Age of Onset  . Diabetes Mother   . Breast cancer Mother        had masectomy.   . Skin cancer Maternal Grandfather    Allergies: No Known Allergies Medications: See med rec.  Review of Systems: No fevers, chills, night sweats, weight loss, chest pain, or shortness of breath.   Objective:    General: Well Developed, well nourished, and in no acute distress.  Neuro: Alert and oriented x3, extra-ocular muscles intact, sensation grossly intact.  HEENT: Normocephalic, atraumatic, pupils equal round reactive to light, neck supple, no masses, no lymphadenopathy, thyroid nonpalpable.  Skin: Warm and dry, no rashes. Cardiac: Regular rate and rhythm, no murmurs rubs or gallops, no lower extremity edema.  Respiratory: Clear to auscultation bilaterally. Not using accessory muscles, speaking in full sentences.  Impression and Recommendations:    DDD (degenerative disc disease), cervical Left C5 nerve root impingement, post C6-C7 disc replacement. Epidural did not help that much. Good response to  gabapentin, only doing 300 mg at bedtime. I am going to add additional 100 mg gabapentin to be taken sometime through the day  Left shoulder pain For the most part resolved, pain improves with abduction of the shoulder as is typical for a radicular cause of her pain. At this point she can return as needed, she feels as though the pain is minimal enough where she can live with it. ___________________________________________ Gwen Her. Dianah Field, M.D., ABFM., CAQSM. Primary Care and Sports Medicine [facility]  Adjunct Professor of [employer]

## 2018-04-02 NOTE — Assessment & Plan Note (Signed)
For the most part resolved, pain improves with abduction of the shoulder as is typical for a radicular cause of her pain. At this point she can return as needed, she feels as though the pain is minimal enough where she can live with it.

## 2018-04-02 NOTE — Assessment & Plan Note (Signed)
Left C5 nerve root impingement, post C6-C7 disc replacement. Epidural did not help that much. Good response to gabapentin, only doing 300 mg at bedtime. I am going to add additional 100 mg gabapentin to be taken sometime through the day

## 2018-04-12 ENCOUNTER — Other Ambulatory Visit: Payer: Self-pay

## 2018-04-12 DIAGNOSIS — L74510 Primary focal hyperhidrosis, axilla: Secondary | ICD-10-CM

## 2018-04-12 MED ORDER — GLYCOPYRROLATE 1 MG PO TABS: 1.0000 mg | ORAL_TABLET | Freq: Every day | ORAL | 1 refills | Status: DC

## 2018-04-12 NOTE — Telephone Encounter (Signed)
Pt pharmacy calls requesting rf on pts Glycopyrolate. Will rf pt is up to date on FU appts. Will send in #90 w 1 rf.

## 2018-04-23 ENCOUNTER — Ambulatory Visit: Payer: Self-pay

## 2018-04-30 ENCOUNTER — Other Ambulatory Visit: Payer: Self-pay | Admitting: Family Medicine

## 2018-04-30 DIAGNOSIS — Z1231 Encounter for screening mammogram for malignant neoplasm of breast: Secondary | ICD-10-CM

## 2018-05-06 ENCOUNTER — Other Ambulatory Visit: Payer: Self-pay | Admitting: Sports Medicine

## 2018-05-06 ENCOUNTER — Other Ambulatory Visit: Payer: Self-pay | Admitting: Family Medicine

## 2018-05-06 DIAGNOSIS — G8929 Other chronic pain: Secondary | ICD-10-CM

## 2018-05-06 DIAGNOSIS — M25512 Pain in left shoulder: Principal | ICD-10-CM

## 2018-05-08 ENCOUNTER — Other Ambulatory Visit: Payer: Self-pay | Admitting: Family Medicine

## 2018-05-08 NOTE — Telephone Encounter (Signed)
Please inform patient the following information: Received another refill request for patient's hydroxyzine.  This was filled 04/01/2018 for a total of 6 months.  At the [facility] in [place].  She should have plenty of this medication please make patient and pharmacy aware. ?  Are they using the wrong prescription number from an old prescription

## 2018-05-08 NOTE — Telephone Encounter (Signed)
Spoke with patient and she has spoken with pharmacy who stated they have the RX and are supposed to be mailing the medication to her. I tried to call the pharmacy x3 and every time I called the line was busy. Pt will call back if there are any issues with the medication and getting refills.

## 2018-06-27 ENCOUNTER — Other Ambulatory Visit: Payer: Self-pay | Admitting: Sports Medicine

## 2018-06-27 DIAGNOSIS — M5030 Other cervical disc degeneration, unspecified cervical region: Secondary | ICD-10-CM

## 2018-06-27 MED ORDER — GABAPENTIN 100 MG PO CAPS: ORAL_CAPSULE | ORAL | 1 refills | Status: DC

## 2018-06-30 ENCOUNTER — Other Ambulatory Visit: Payer: Self-pay | Admitting: Family Medicine

## 2018-07-15 ENCOUNTER — Other Ambulatory Visit: Payer: Self-pay | Admitting: Sports Medicine

## 2018-07-15 DIAGNOSIS — G8929 Other chronic pain: Secondary | ICD-10-CM

## 2018-07-15 DIAGNOSIS — M25512 Pain in left shoulder: Principal | ICD-10-CM

## 2018-07-15 MED ORDER — MELOXICAM 15 MG PO TABS: ORAL_TABLET | ORAL | 3 refills | Status: DC

## 2018-07-24 ENCOUNTER — Encounter: Payer: Self-pay | Admitting: Family Medicine

## 2018-07-24 NOTE — Telephone Encounter (Signed)
Pt My Chart message: Is there anyway you can send me a new left hand brace? Mine is worn out and falling apart. Thanks.  I see where this was addressed 12/18/2017 with phone notes. Please advise.

## 2018-07-25 ENCOUNTER — Telehealth: Payer: Self-pay | Admitting: Family Medicine

## 2018-07-25 NOTE — Telephone Encounter (Signed)
Spoke with Laura Steele, she states that the brace she currently has she received here at our office.  She does not want to pick up a RX for one, if she has to get it from a store she would like to know if [facility].  She states that it is just a left wrist brace that has the metal piece that supports wrist and thumb, size Medium.

## 2018-07-25 NOTE — Telephone Encounter (Signed)
Received E chart request for a new left wrist brace. I am okay with providing her a prescription for a new wrist brace that she would then take to a medical supply store to pick up a new wrist brace.  However I do need to know the type of brace she is referring to and what size her  brace is.

## 2018-07-26 NOTE — Telephone Encounter (Signed)
Pt would have to come for OV in order to be able to charge for the wrist brace. Pt was give options and stated she would think about it and call back.

## 2018-07-26 NOTE — Telephone Encounter (Signed)
To my knowledge there is no way to charge her for one without and office visit here- if taken from our stock.  We can check with Estill Bamberg and see if there is a way around this, but I do not think there is- our stock is for acute needs (ie pts in office).  If we can get it to her- she will still need to pick it up here.  I can happily mail her the script so she can pick it up at a supply store (please give her list of local).  Lastly, she can pay out of pocket from [facility], [facility] or even [facility].

## 2018-07-26 NOTE — Telephone Encounter (Signed)
Called pt and LM to return call.

## 2018-08-30 ENCOUNTER — Other Ambulatory Visit: Payer: Self-pay | Admitting: Family Medicine

## 2018-09-26 ENCOUNTER — Telehealth: Payer: Self-pay

## 2018-09-26 NOTE — Telephone Encounter (Signed)
Faxed refill request for patients Duloxetine. Pt is due for 6 months [facility] appt. My chart message sent to patient and fax sent back to pharmacy with note that says pt needs appt

## 2018-10-03 ENCOUNTER — Ambulatory Visit (INDEPENDENT_AMBULATORY_CARE_PROVIDER_SITE_OTHER): Payer: 59 | Admitting: Family Medicine

## 2018-10-03 ENCOUNTER — Encounter: Payer: Self-pay | Admitting: Family Medicine

## 2018-10-03 ENCOUNTER — Other Ambulatory Visit: Payer: Self-pay

## 2018-10-03 VITALS — BP 109/71 | HR 88 | Temp 98.0°F | Resp 16 | Ht 63.0 in | Wt 139.4 lb

## 2018-10-03 DIAGNOSIS — L74510 Primary focal hyperhidrosis, axilla: Secondary | ICD-10-CM

## 2018-10-03 DIAGNOSIS — F419 Anxiety disorder, unspecified: Secondary | ICD-10-CM

## 2018-10-03 DIAGNOSIS — G4700 Insomnia, unspecified: Secondary | ICD-10-CM

## 2018-10-03 DIAGNOSIS — R252 Cramp and spasm: Secondary | ICD-10-CM

## 2018-10-03 LAB — BASIC METABOLIC PANEL
BUN: 5 mg/dL — ABNORMAL LOW (ref 6–23)
CO2: 24 mEq/L (ref 19–32)
Calcium: 8.5 mg/dL (ref 8.4–10.5)
Chloride: 105 mEq/L (ref 96–112)
Creatinine, Ser: 0.44 mg/dL (ref 0.40–1.20)
GFR: 156.72 mL/min (ref >60.00)
Glucose, Bld: 91 mg/dL (ref 70–99)
Potassium: 4 mEq/L (ref 3.5–5.1)
Sodium: 138 mEq/L (ref 135–145)

## 2018-10-03 LAB — MAGNESIUM: Magnesium: 2.1 mg/dL (ref 1.5–2.5)

## 2018-10-03 MED ORDER — HYDROXYZINE HCL 25 MG PO TABS: ORAL_TABLET | ORAL | 1 refills | Status: DC

## 2018-10-03 MED ORDER — DULOXETINE HCL 30 MG PO CPEP: ORAL_CAPSULE | ORAL | 1 refills | Status: DC

## 2018-10-03 MED ORDER — GLYCOPYRROLATE 1 MG PO TABS: 1.0000 mg | ORAL_TABLET | Freq: Every day | ORAL | 1 refills | Status: DC

## 2018-10-03 NOTE — Patient Instructions (Signed)
I will call you with lab results. If labs are normal- ask Dr. Darene Lamer about the muscle cramp in your forearm- you may need to incorporate some muscle strengthening in that forearm   I have refilled you meds.

## 2018-10-03 NOTE — Progress Notes (Signed)
Laura Steele , Sep 07, 1975, 43 y.o., female MRN: [id number] Patient Care Team    Relationship Specialty Notifications Start End  Ma Hillock, DO PCP - General Family Medicine  10/22/15   Ashok Pall, MD Consulting Physician Neurosurgery  06/22/17   Silverio Decamp, MD Consulting Physician Sports Medicine  06/22/17     Chief Complaint  Patient presents with  . Anxiety    Pt is doing well with no complaints. Needs refils.   . Arm Pain    Pt complains of Left arm pain. 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  Muscle cramps Reports her left forearm has been cramping with minimal use.  She has been using a wrist splint for a chronic injury and suspected carpal tunnel syndrome.  She states this only occurs with certain ranges of motion and only in her left forearm.  Range of motion including when attempting to lift or fasten her bra strap.  Hypothyroidism:  Last collection 04/01/2018- TSH WNL.  Patient reports compliance with levothyroxine 25 mcg daily.  Anxiety/Insomnia:  Doing well on Cymbalta- well controlled. Still working through pandemic.   Depression screen [facility] 2/9 10/24/2017 06/22/2017 12/18/2016 12/04/2016 06/09/2016  Decreased Interest 0 1 0 - 1  Down, Depressed, Hopeless 0 0 0 0 0  PHQ - 2 Score 0 1 0 0 1  Altered sleeping - 2 - 0 1  Tired, decreased energy - 3 - 0 2  Change in appetite - 2 - 0 0  Feeling bad or failure about yourself  - 0 - 0 0  Trouble concentrating - 0 - 0 0  Moving slowly or fidgety/restless - 0 - 0 1  Suicidal thoughts - 0 - 0 0  PHQ-9 Score - 8 - 0 5  Difficult doing work/chores - Somewhat difficult - - -   GAD 7 : Generalized Anxiety Score 10/03/2018 10/24/2017 06/22/2017 11/20/2016  Nervous, Anxious, on Edge 0 0 0 3  Control/stop worrying 1 0 1 2  Worry too much - different  things 1 0 1 3  Trouble relaxing 1 0 1 3  Restless 1 0 1 2  Easily annoyed or irritable 0 0 1 3  Afraid - awful might happen 0 0 0 3  Total GAD 7 Score 4 0 5 19  Anxiety Difficulty Not difficult at all - Somewhat difficult Very difficult     No Known Allergies Social History   Tobacco Use  . Smoking status: Current Every Day Smoker    Packs/day: 0.50    Types: Cigarettes  . Smokeless tobacco: Never Used  Substance Use Topics  . Alcohol use: Yes    Comment: Pt states was drinkning 40 0z of beer a night. but stopped since sunday.   Past Medical History:  Diagnosis Date  . Allergy   . Anxiety   . Hypoglycemia    Past Surgical History:  Procedure Laterality Date  . ABDOMINAL HYSTERECTOMY    . ANTERIOR CERVICAL DISCECTOMY  03/2017   C6-7; Dr. Christella Noa   Family History  Problem Relation Age of Onset  . Diabetes Mother   . Breast cancer Mother        had masectomy.   . Skin cancer Maternal Grandfather    Allergies as of 10/03/2018   No Known Allergies  Medication List       Accurate as of October 03, 2018 11:07 AM. If you have any questions, ask your nurse or doctor.        B-complex with vitamin C tablet Take 1 tablet by mouth daily.   Baclofen 5 MG Tabs Take 5 mg by mouth 3 (three) times daily as needed.   baclofen 10 MG tablet Commonly known as: LIORESAL TAKE 1/2 TABLET BY MOUTH 3 TIMES DAILY AS NEEDED   DULoxetine 30 MG capsule Commonly known as: CYMBALTA TAKE 1 CAPSULE BY MOUTH EVERY DAY   gabapentin 300 MG capsule Commonly known as: NEURONTIN One tab PO qHS for a week, then BID for a week, then TID. May double weekly to a max of 3,600mg /day   gabapentin 100 MG capsule Commonly known as: NEURONTIN As needed PO up to BID though the day   glycopyrrolate 1 MG tablet Commonly known as: Robinul Take 1 tablet (1 mg total) by mouth daily.   HYDROcodone-acetaminophen 5-325 MG tablet Commonly known as: NORCO/VICODIN Take 1 tablet by mouth every 8  (eight) hours as needed for moderate pain.   hydrOXYzine 25 MG tablet Commonly known as: ATARAX/VISTARIL 1-2 tabs PO at bedtime   levothyroxine 25 MCG tablet Commonly known as: SYNTHROID Take 1 tablet (25 mcg total) by mouth daily.   meloxicam 15 MG tablet Commonly known as: MOBIC TAKE 1 TABLET BY MOUTH IN THE MORNING WITH BREAKFAST FOR 2 WEEKS, THEN DAILY AS NEEDED FOR PAIN       All past medical history, surgical history, allergies, family history, immunizations andmedications were updated in the EMR today and reviewed under the history and medication portions of their EMR.     ROS: Negative, with the exception of above mentioned in HPI   Objective:  BP 109/71 (BP Location: Left Arm, Patient Position: Sitting, Cuff Size: Normal)   Pulse 88   Temp 98 F (36.7 C) (Temporal)   Resp 16   Ht 5\' 3"  (1.6 m)   Wt 139 lb 6 oz (63.2 kg)   SpO2 97%   BMI 24.69 kg/m  Body mass index is 24.69 kg/m. Gen: Afebrile. No acute distress.  Toxic in presentation, well-developed, well-nourished, pleasant Caucasian female. HENT: AT. Noank. . MMM.  Eyes:Pupils Equal Round Reactive to light, Extraocular movements intact,  Conjunctiva without redness, discharge or icterus. Neck/lymp/endocrine: Supple, no lymphadenopathy, no thyromegaly CV: RRR no murmur, no edema, +2/4 P posterior tibialis pulses Chest: CTAB, no wheeze or crackles MSK: Left forearm without erythema, rash or swelling.  Decreased muscle mass noted.  Full range of motion.  Weakness with resisted pronation.  Positive Tinel's at wrist.  Neurovascularly intact distally. Neuro:  Normal gait. PERLA. EOMi. Alert. Oriented x3  Psych: Normal affect, dress and demeanor. Normal speech. Normal thought content and judgment.  No exam data present No results found. No results found for this or any previous visit (from the past 24 hour(s)).  Assessment/Plan: [name] is a 43 y.o. female present for OV for  hypothyroid -Stable.   Continue levothyroxine 25 mcg daily.  Labs will be due next office appointment.  Anxiety/insomnia:  -Stable. Refilled Cymbalta and vistaril keep routine appt.q 6 mos  Hyperhidrosis of axilla Stable.  Refills provided on Robinul - glycopyrrolate (ROBINUL) 1 MG tablet; Take 1 tablet (1 mg total) by mouth daily.  Dispense: 90 tablet; Refill: 1  Muscle cramps Ensure electrolytes are within normal ranges.  Suspect muscle atrophy, decreased strength from elongated use of wrist splints  from chronic injury and carpal tunnel.  If labs are normal I have encouraged her to call her sports med doctor for follow-up.  May need further eval or strengthening exercises. - Basic Metabolic Panel (BMET) - Magnesium   Reviewed expectations re: course of current medical issues.  Discussed self-management of symptoms.  Outlined signs and symptoms indicating need for more acute intervention.  Patient verbalized understanding and all questions were answered.  Patient received an After-Visit Summary.    No orders of the defined types were placed in this encounter.  > 25 minutes spent with patient, >50% of time spent face to face    Note is dictated utilizing voice recognition software. Although note has been proof read prior to signing, occasional typographical errors still can be missed. If any questions arise, please do not hesitate to call for verification.   electronically signed by:  Howard Pouch, DO  [place]

## 2018-10-07 ENCOUNTER — Ambulatory Visit: Payer: 59 | Admitting: Sports Medicine

## 2018-12-01 LAB — LIPID PANEL
Cholesterol: 204 — AB (ref 0–200)
HDL: 70 (ref 35–70)
LDL Cholesterol: 112
Triglycerides: 110 (ref 40–160)

## 2018-12-03 ENCOUNTER — Encounter: Payer: Self-pay | Admitting: Family Medicine

## 2018-12-16 ENCOUNTER — Other Ambulatory Visit: Payer: Self-pay | Admitting: Sports Medicine

## 2018-12-16 DIAGNOSIS — M5030 Other cervical disc degeneration, unspecified cervical region: Secondary | ICD-10-CM

## 2018-12-17 ENCOUNTER — Encounter: Payer: Self-pay | Admitting: Sports Medicine

## 2018-12-17 DIAGNOSIS — M5030 Other cervical disc degeneration, unspecified cervical region: Secondary | ICD-10-CM

## 2018-12-17 MED ORDER — GABAPENTIN 300 MG PO CAPS: 300.0000 mg | ORAL_CAPSULE | Freq: Every day | ORAL | 3 refills | Status: DC

## 2018-12-17 MED ORDER — GABAPENTIN 100 MG PO CAPS: ORAL_CAPSULE | ORAL | 3 refills | Status: DC

## 2019-01-20 ENCOUNTER — Ambulatory Visit: Payer: 59 | Admitting: Family Medicine

## 2019-02-07 ENCOUNTER — Ambulatory Visit: Payer: 59 | Admitting: Sports Medicine

## 2019-02-10 ENCOUNTER — Encounter: Payer: Self-pay | Admitting: Sports Medicine

## 2019-02-10 ENCOUNTER — Other Ambulatory Visit: Payer: Self-pay

## 2019-02-10 ENCOUNTER — Ambulatory Visit (INDEPENDENT_AMBULATORY_CARE_PROVIDER_SITE_OTHER): Payer: 59 | Admitting: Sports Medicine

## 2019-02-10 DIAGNOSIS — G540 Brachial plexus disorders: Secondary | ICD-10-CM

## 2019-02-10 DIAGNOSIS — G5603 Carpal tunnel syndrome, bilateral upper limbs: Secondary | ICD-10-CM

## 2019-02-10 MED ORDER — GABAPENTIN 300 MG PO CAPS: ORAL_CAPSULE | ORAL | 3 refills | Status: DC

## 2019-02-10 NOTE — Assessment & Plan Note (Signed)
Persistent symptoms in spite of night splints. Mostly left-sided. Hydrodissection today. Return in a month for this. She does get additional paresthesias as well as pulselessness with abduction of the left arm raising the suspicion for thoracic outlet syndrome, please see below.

## 2019-02-10 NOTE — Progress Notes (Signed)
Subjective:    CC: L neck pain and numbness in hand.   HPI: Laura Steele is a pleasant 43 year old with a history significant for cervical DDD (s/p disk replacement) and bilateral carpal tunnel syndrome presenting today for 1 week of L neck pain that radiates down her arm to the lateral elbow and severe numbness in her L hand that is worse with elevation. Her symptoms began 1 week ago presenting as pain and an inability to rotate her neck. She was seen at an urgent care the following day where she received prednisone and x-ray of the cervical spine. Her symptoms got progressivly worse through Friday when she was unable to drive due to restricted ROM. Currently she states that her symptoms have improved to the point she is able to participate in daily activities. 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 She reports using her wrist brace for carpal tunnel every night and that her symptoms feel the same. She had a fall in the past week but denies provocation of her pre-existing symptoms.   I reviewed the past medical history, family history, social history, surgical history, and allergies today and no changes were needed.  Please see the problem list section below in epic for further details.  Past Medical History: Past Medical History:  Diagnosis Date  . Allergy   . Anxiety   . Hypoglycemia    Past Surgical History: Past Surgical History:  Procedure Laterality Date  . ABDOMINAL HYSTERECTOMY    . ANTERIOR CERVICAL DISCECTOMY  03/2017   C6-7; Dr. Christella Noa   Social History: Social History   Socioeconomic History  . Marital status: Married    Spouse name: Not on file  . Number of children: Not on file  . Years of education: Not on file  . Highest education level: Not on file  Occupational History  . Not on file  Social Needs  . Financial resource strain: Not on file  . Food insecurity    Worry: Not on file    Inability: Not  on file  . Transportation needs    Medical: Not on file    Non-medical: Not on file  Tobacco Use  . Smoking status: Current Every Day Smoker    Packs/day: 0.50    Types: Cigarettes  . Smokeless tobacco: Never Used  Substance and Sexual Activity  . Alcohol use: Yes    Comment: Pt states was drinkning 40 0z of beer a night. but stopped since sunday.  . Drug use: No  . Sexual activity: Yes  Lifestyle  . Physical activity    Days per week: Not on file    Minutes per session: Not on file  . Stress: Not on file  Relationships  . Social Herbalist on phone: Not on file    Gets together: Not on file    Attends religious service: Not on file    Active member of club or organization: Not on file    Attends meetings of clubs or organizations: Not on file    Relationship status: Not on file  Other Topics Concern  . Not on file  Social History Narrative  . Not on file   Family History: Family History  Problem Relation Age of Onset  . Diabetes Mother   . Breast cancer Mother        had masectomy.   . Skin cancer Maternal Grandfather    Allergies:  No Known Allergies Medications: See med rec.  Review of Systems: No fevers, chills, night sweats, weight loss, chest pain, or shortness of breath.   Objective:    General: Well Developed, well nourished, and in no acute distress.  Neuro: Alert and oriented x3, extra-ocular muscles intact, sensation grossly intact.  HEENT: Normocephalic, atraumatic, pupils equal round reactive to light, neck supple, no masses, no lymphadenopathy, thyroid nonpalpable.  Skin: Warm and dry, no rashes. Cardiac: Regular rate and rhythm, no murmurs rubs or gallops, no lower extremity edema.  Respiratory: Clear to auscultation bilaterally. Not using accessory muscles, speaking in full sentences.  L Wrist: Inspection normal with no visible erythema or swelling. ROM smooth and normal with good flexion and extension and ulnar/radial deviation that  is symmetrical with opposite wrist. Palpation is normal with no point tenderness. Grip strength 4/5  Positive tinel's. Loss of radial pulse when elevated above heart level  A/P: Laura Steele is presenting today with a week of neck pain that radiates down her lateral arm to the elbow and numbness/ tingling her wrist. Her current symptoms in the setting of her prior carpal tunnel and cervical DDD can point towards concomitant presentation of both underlying conditions. However, the findings of diminished radial pulse in her L wrist with elevation is concerning for a thoracic outlet syndrome. Work up for thoracic outlet has been initiated with a brachial plexus MRI. Refferal to vascular surgery has been made for further evaluation.  Carpal Tunnel: Continue night times splinting of wrist. Hydrodissetction of L median nerve was performed.  Cervical DDD: Increased dose of gabapentin to 300 mg in the morning and 600 mg at night.  Impression and Recommendations:    Carpal tunnel syndrome, bilateral Persistent symptoms in spite of night splints. Mostly left-sided. Hydrodissection today. Return in a month for this. She does get additional paresthesias as well as pulselessness with abduction of the left arm raising the suspicion for thoracic outlet syndrome, please see below.  Thoracic outlet syndrome of left thoracic outlet She does get additional paresthesias as well as pulselessness with abduction of the left arm raising the suspicion for thoracic outlet syndrome, please see below. No cervical ribs on x-rays. Typically with a radicular source of paresthesias they should improve with abduction of the ipsilateral side. Adding a brachial plexus MRI, I would like her to touch base with vascular surgery. Increasing gabapentin in the meantime.   ___________________________________________ Gwen Her. Dianah Field, M.D., ABFM., CAQSM. Primary Care and Sports Medicine [facility]   Adjunct Professor of [employer] [employer]

## 2019-02-10 NOTE — Assessment & Plan Note (Addendum)
She does get additional paresthesias as well as pulselessness with abduction of the left arm raising the suspicion for thoracic outlet syndrome, please see below. No cervical ribs on x-rays. Typically with a radicular source of paresthesias they should improve with abduction of the ipsilateral side. Adding a brachial plexus MRI, I would like her to touch base with vascular surgery. Increasing gabapentin in the meantime.

## 2019-02-13 ENCOUNTER — Other Ambulatory Visit: Payer: Self-pay | Admitting: Sports Medicine

## 2019-02-13 MED ORDER — TRIAZOLAM 0.25 MG PO TABS: ORAL_TABLET | ORAL | 0 refills | Status: DC

## 2019-02-16 ENCOUNTER — Other Ambulatory Visit: Payer: Self-pay

## 2019-02-16 ENCOUNTER — Ambulatory Visit (INDEPENDENT_AMBULATORY_CARE_PROVIDER_SITE_OTHER): Payer: 59

## 2019-02-16 DIAGNOSIS — G540 Brachial plexus disorders: Secondary | ICD-10-CM

## 2019-03-06 ENCOUNTER — Telehealth: Payer: Self-pay

## 2019-03-06 NOTE — Telephone Encounter (Signed)
Pt called and LVM requesting a return call. When I spoke with pt she stated she got a phone call about rescheduling her upcoming appt with Dr. Darene Lamer on 03/10/2019 at 9:45 AM. I checked with Amber who stated that he was seeing pt's this day and she verified this with Dr. Darene Lamer directly. Spoke with Mardene Celeste who states she did not call this pt for rescheduling. Pt instructed to keep the appointment listed above.

## 2019-03-10 ENCOUNTER — Ambulatory Visit: Payer: 59 | Admitting: Sports Medicine

## 2019-03-12 ENCOUNTER — Encounter: Payer: Self-pay | Admitting: Sports Medicine

## 2019-03-12 ENCOUNTER — Other Ambulatory Visit: Payer: Self-pay

## 2019-03-12 ENCOUNTER — Ambulatory Visit (INDEPENDENT_AMBULATORY_CARE_PROVIDER_SITE_OTHER): Payer: 59 | Admitting: Sports Medicine

## 2019-03-12 DIAGNOSIS — M5030 Other cervical disc degeneration, unspecified cervical region: Secondary | ICD-10-CM | POA: Diagnosis not present

## 2019-03-12 DIAGNOSIS — G5603 Carpal tunnel syndrome, bilateral upper limbs: Secondary | ICD-10-CM | POA: Diagnosis not present

## 2019-03-12 NOTE — Assessment & Plan Note (Signed)
Recurrence of the left sided cervical radicular pain, she is post C6-C7 disc replacement, she has adjacent level disease at C5-C6. Last epidural was a year ago, proceeding with a repeat epidural, left C6-C7 interlaminar.

## 2019-03-12 NOTE — Assessment & Plan Note (Signed)
Completely resolved after left median nerve hydrodissection at the last visit, return as needed for this.

## 2019-03-12 NOTE — Progress Notes (Signed)
Subjective:    CC: Follow-up  HPI: Carpal tunnel syndrome: Left-sided, resolved after median nerve hydrodissection at the last visit.  Neck pain: Known cervical DDD, history of C6-C7 disc replacement with adjacent level disease at C4-C5 and C5-C6, she did have an epidural about a year ago with excellent relief.  I reviewed the past medical history, family history, social history, surgical history, and allergies today and no changes were needed.  Please see the problem list section below in epic for further details.  Past Medical History: Past Medical History:  Diagnosis Date  . Allergy   . Anxiety   . Hypoglycemia    Past Surgical History: Past Surgical History:  Procedure Laterality Date  . ABDOMINAL HYSTERECTOMY    . ANTERIOR CERVICAL DISCECTOMY  03/2017   C6-7; Dr. Christella Noa   Social History: Social History   Socioeconomic History  . Marital status: Married    Spouse name: Not on file  . Number of children: Not on file  . Years of education: Not on file  . Highest education level: Not on file  Occupational History  . Not on file  Tobacco Use  . Smoking status: Current Every Day Smoker    Packs/day: 0.50    Types: Cigarettes  . Smokeless tobacco: Never Used  Substance and Sexual Activity  . Alcohol use: Yes    Comment: Pt states was drinkning 40 0z of beer a night. but stopped since sunday.  . Drug use: No  . Sexual activity: Yes  Other Topics Concern  . Not on file  Social History Narrative  . Not on file   Social Determinants of Health   Financial Resource Strain:   . Difficulty of Paying Living Expenses: Not on file  Food Insecurity:   . Worried About Charity fundraiser in the Last Year: Not on file  . Ran Out of Food in the Last Year: Not on file  Transportation Needs:   . Lack of Transportation (Medical): Not on file  . Lack of Transportation (Non-Medical): Not on file  Physical Activity:   . Days of Exercise per Week: Not on file  . Minutes of  Exercise per Session: Not on file  Stress:   . Feeling of Stress : Not on file  Social Connections:   . Frequency of Communication with Friends and Family: Not on file  . Frequency of Social Gatherings with Friends and Family: Not on file  . Attends Religious Services: Not on file  . Active Member of Clubs or Organizations: Not on file  . Attends Archivist Meetings: Not on file  . Marital Status: Not on file   Family History: Family History  Problem Relation Age of Onset  . Diabetes Mother   . Breast cancer Mother        had masectomy.   . Skin cancer Maternal Grandfather    Allergies: No Known Allergies Medications: See med rec.  Review of Systems: No fevers, chills, night sweats, weight loss, chest pain, or shortness of breath.   Objective:    General: Well Developed, well nourished, and in no acute distress.  Neuro: Alert and oriented x3, extra-ocular muscles intact, sensation grossly intact.  HEENT: Normocephalic, atraumatic, pupils equal round reactive to light, neck supple, no masses, no lymphadenopathy, thyroid nonpalpable.  Skin: Warm and dry, no rashes. Cardiac: Regular rate and rhythm, no murmurs rubs or gallops, no lower extremity edema.  Respiratory: Clear to auscultation bilaterally. Not using accessory muscles, speaking in full  sentences.  Impression and Recommendations:    DDD (degenerative disc disease), cervical Recurrence of the left sided cervical radicular pain, she is post C6-C7 disc replacement, she has adjacent level disease at C5-C6. Last epidural was a year ago, proceeding with a repeat epidural, left C6-C7 interlaminar.  Carpal tunnel syndrome, bilateral Completely resolved after left median nerve hydrodissection at the last visit, return as needed for this.   ___________________________________________ Gwen Her. Dianah Field, M.D., ABFM., CAQSM. Primary Care and Sports Medicine [facility]  Adjunct Professor  of [place] of [employer] [employer] [facility] [employer]

## 2019-03-17 ENCOUNTER — Telehealth: Payer: Self-pay

## 2019-03-17 NOTE — Telephone Encounter (Signed)
Refill request from [facility] in [facility] [place] for patients Levothyroxine. States no medication refills until pt has appt. Last appt was 04/01/2018. My chart message was sent to patient letting her know she is overdue for appt and needs to schedule.

## 2019-03-19 ENCOUNTER — Inpatient Hospital Stay: Admission: RE | Admit: 2019-03-19 | Payer: 59 | Source: Ambulatory Visit

## 2019-03-25 ENCOUNTER — Ambulatory Visit (INDEPENDENT_AMBULATORY_CARE_PROVIDER_SITE_OTHER): Payer: 59 | Admitting: Family Medicine

## 2019-03-25 ENCOUNTER — Other Ambulatory Visit: Payer: Self-pay

## 2019-03-25 ENCOUNTER — Other Ambulatory Visit: Payer: Self-pay | Admitting: Family Medicine

## 2019-03-25 ENCOUNTER — Encounter: Payer: Self-pay | Admitting: Family Medicine

## 2019-03-25 ENCOUNTER — Telehealth: Payer: Self-pay | Admitting: Family Medicine

## 2019-03-25 VITALS — BP 109/73 | HR 87 | Temp 97.9°F | Resp 16 | Ht 63.0 in | Wt 137.5 lb

## 2019-03-25 DIAGNOSIS — D7589 Other specified diseases of blood and blood-forming organs: Secondary | ICD-10-CM

## 2019-03-25 DIAGNOSIS — G4700 Insomnia, unspecified: Secondary | ICD-10-CM | POA: Diagnosis not present

## 2019-03-25 DIAGNOSIS — L74510 Primary focal hyperhidrosis, axilla: Secondary | ICD-10-CM | POA: Diagnosis not present

## 2019-03-25 DIAGNOSIS — F419 Anxiety disorder, unspecified: Secondary | ICD-10-CM

## 2019-03-25 DIAGNOSIS — D582 Other hemoglobinopathies: Secondary | ICD-10-CM

## 2019-03-25 DIAGNOSIS — R718 Other abnormality of red blood cells: Secondary | ICD-10-CM

## 2019-03-25 DIAGNOSIS — E039 Hypothyroidism, unspecified: Secondary | ICD-10-CM | POA: Diagnosis not present

## 2019-03-25 LAB — CBC WITH DIFFERENTIAL/PLATELET
Basophils Absolute: 0.1 10*3/uL (ref 0.0–0.1)
Basophils Relative: 1 % (ref 0.0–3.0)
Eosinophils Absolute: 0.2 10*3/uL (ref 0.0–0.7)
Eosinophils Relative: 2.5 % (ref 0.0–5.0)
HCT: 49.4 % — ABNORMAL HIGH (ref 36.0–46.0)
Hemoglobin: 16.6 g/dL — ABNORMAL HIGH (ref 12.0–15.0)
Lymphocytes Relative: 22.7 % (ref 12.0–46.0)
Lymphs Abs: 2 10*3/uL (ref 0.7–4.0)
MCHC: 33.6 g/dL (ref 30.0–36.0)
MCV: 104.6 fl — ABNORMAL HIGH (ref 78.0–100.0)
Monocytes Absolute: 0.8 10*3/uL (ref 0.1–1.0)
Monocytes Relative: 8.9 % (ref 3.0–12.0)
Neutro Abs: 5.8 10*3/uL (ref 1.4–7.7)
Neutrophils Relative %: 64.9 % (ref 43.0–77.0)
Platelets: 201 10*3/uL (ref 150.0–400.0)
RBC: 4.73 Mil/uL (ref 3.87–5.11)
RDW: 13 % (ref 11.5–15.5)
WBC: 8.9 10*3/uL (ref 4.0–10.5)

## 2019-03-25 LAB — T4, FREE: Free T4: 0.9 ng/dL (ref 0.60–1.60)

## 2019-03-25 LAB — TSH: TSH: 2.34 u[IU]/mL (ref 0.35–4.50)

## 2019-03-25 MED ORDER — HYDROXYZINE HCL 25 MG PO TABS: ORAL_TABLET | ORAL | 1 refills | Status: DC

## 2019-03-25 MED ORDER — LEVOTHYROXINE SODIUM 25 MCG PO TABS: 25.0000 ug | ORAL_TABLET | Freq: Every day | ORAL | 3 refills | Status: DC

## 2019-03-25 MED ORDER — DULOXETINE HCL 30 MG PO CPEP: ORAL_CAPSULE | ORAL | 1 refills | Status: DC

## 2019-03-25 MED ORDER — GLYCOPYRROLATE 1 MG PO TABS: 1.0000 mg | ORAL_TABLET | Freq: Every day | ORAL | 1 refills | Status: DC

## 2019-03-25 NOTE — Progress Notes (Signed)
Laura Steele , 1975-07-30, 43 y.o., female MRN: [id number] Patient Care Team    Relationship Specialty Notifications Start End  Ma Hillock, DO PCP - General Family Medicine  10/22/15   Ashok Pall, MD Consulting Physician Neurosurgery  06/22/17   Silverio Decamp, MD Consulting Physician Sports Medicine  06/22/17     Chief Complaint  Patient presents with  . Hypothyroidism    Pt would like lab work and needs refills on medicaitons      Subjective: Laura Steele is a 43 y.o. 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  Hypothyroidism:  Last collection 04/01/2018- TSH WNL.  Patient reports compliance with levothyroxine 25 mcg daily she is due for labs and refills today  Anxiety/Insomnia:  Doing well on Cymbalta- well controlled. Not sleeping well, but reports she has not been receiving her vistaril from her pharmacy.    Depression screen [facility] 2/9 10/24/2017 06/22/2017 12/18/2016 12/04/2016 06/09/2016  Decreased Interest 0 1 0 - 1  Down, Depressed, Hopeless 0 0 0 0 0  PHQ - 2 Score 0 1 0 0 1  Altered sleeping - 2 - 0 1  Tired, decreased energy - 3 - 0 2  Change in appetite - 2 - 0 0  Feeling bad or failure about yourself  - 0 - 0 0  Trouble concentrating - 0 - 0 0  Moving slowly or fidgety/restless - 0 - 0 1  Suicidal thoughts - 0 - 0 0  PHQ-9 Score - 8 - 0 5  Difficult doing work/chores - Somewhat difficult - - -   GAD 7 : Generalized Anxiety Score 03/25/2019 10/03/2018 10/24/2017 06/22/2017  Nervous, Anxious, on Edge 0 0 0 0  Control/stop worrying 0 1 0 1  Worry too much - different things 0 1 0 1  Trouble relaxing 3 1 0 1  Restless 0 1 0 1  Easily annoyed or irritable 0 0 0 1  Afraid - awful might happen 0 0 0 0  Total GAD 7 Score 3 4 0 5  Anxiety Difficulty Not difficult at all Not difficult at all - Somewhat difficult      No Known Allergies Social History   Tobacco Use  . Smoking  status: Current Every Day Smoker    Packs/day: 0.50    Types: Cigarettes  . Smokeless tobacco: Never Used  Substance Use Topics  . Alcohol use: Yes    Comment: Pt states was drinkning 40 0z of beer a night. but stopped since sunday.   Past Medical History:  Diagnosis Date  . Allergy   . Anxiety   . Hypoglycemia    Past Surgical History:  Procedure Laterality Date  . ABDOMINAL HYSTERECTOMY    . ANTERIOR CERVICAL DISCECTOMY  03/2017   C6-7; Dr. Christella Noa   Family History  Problem Relation Age of Onset  . Diabetes Mother   . Breast cancer Mother        had masectomy.   . Skin cancer Maternal Grandfather    Allergies as of 03/25/2019   No Known Allergies     Medication List       Accurate as of March 25, 2019 10:15 AM. If you have any questions, ask your nurse or doctor.        B-complex with vitamin C tablet Take 1 tablet by mouth daily.   Baclofen 5 MG Tabs Take 5 mg by mouth 3 (three) times  daily as needed.   DULoxetine 30 MG capsule Commonly known as: CYMBALTA TAKE 1 CAPSULE BY MOUTH EVERY DAY   gabapentin 300 MG capsule Commonly known as: NEURONTIN 1 capsule in the morning, 2 capsules at night   glycopyrrolate 1 MG tablet Commonly known as: Robinul Take 1 tablet (1 mg total) by mouth daily.   hydrOXYzine 25 MG tablet Commonly known as: ATARAX/VISTARIL 1-2 tabs PO at bedtime   levothyroxine 25 MCG tablet Commonly known as: SYNTHROID Take 1 tablet (25 mcg total) by mouth daily.   meloxicam 15 MG tablet Commonly known as: MOBIC TAKE 1 TABLET BY MOUTH IN THE MORNING WITH BREAKFAST FOR 2 WEEKS, THEN DAILY AS NEEDED FOR PAIN   triazolam 0.25 MG tablet Commonly known as: HALCION 1-2 tabs PO 1 hour before procedure or imaging.  Do not drive with this medication.       All past medical history, surgical history, allergies, family history, immunizations andmedications were updated in the EMR today and reviewed under the history and medication  portions of their EMR.     ROS: Negative, with the exception of above mentioned in HPI   Objective:  BP 109/73 (BP Location: Right Arm, Patient Position: Sitting, Cuff Size: Normal)   Pulse 87   Temp 97.9 F (36.6 C) (Temporal)   Resp 16   Ht 5\' 3"  (1.6 m)   Wt 137 lb 8 oz (62.4 kg)   SpO2 98%   BMI 24.36 kg/m  Body mass index is 24.36 kg/m. Gen: Afebrile. No acute distress.  HENT: AT. [place].  Eyes:Pupils Equal Round Reactive to light, Extraocular movements intact,  Conjunctiva without redness, discharge or icterus. Neck/lymp/endocrine: Supple,no lymphadenopathy, no thyromegaly CV: RRR no murmur, no edema, Chest: CTAB, no wheeze or crackles Skin: no rashes, purpura or petechiae.  Neuro:  Normal gait. PERLA. EOMi. Alert. Oriented x3 Psych: Normal affect, dress and demeanor. Normal speech. Normal thought content and judgment.  No exam data present No results found. No results found for this or any previous visit (from the past 24 hour(s)).  Assessment/Plan: Laura Steele is a 43 y.o. female present for OV for  hypothyroid -Continue levothyroxine 25 mcg daily.   - TSH, T4 collected today.   Anxiety/insomnia:  Stable. Very happy top soon be a grandma.  Refilled Cymbalta and vistaril keep routine appt.q 6 mos  Hyperhidrosis of axilla Stable refills provided today on  Robinul - glycopyrrolate (ROBINUL) 1 MG tablet; Take 1 tablet (1 mg total) by mouth daily.  Dispense: 90 tablet; Refill: 1  Elevated hgb:  - CBC collected today.  - she is a smoker.     Reviewed expectations re: course of current medical issues.  Discussed self-management of symptoms.  Outlined signs and symptoms indicating need for more acute intervention.  Patient verbalized understanding and all questions were answered.  Patient received an After-Visit Summary.    No orders of the defined types were placed in this encounter.   > 25 minutes spent with patient, >50% of time spent face to  face     Note is dictated utilizing voice recognition software. Although note has been proof read prior to signing, occasional typographical errors still can be missed. If any questions arise, please do not hesitate to call for verification.   electronically signed by:  Howard Pouch, DO  [place]

## 2019-03-25 NOTE — Telephone Encounter (Signed)
Pt was called and there was no answer. My chart message was sent with information/results

## 2019-03-25 NOTE — Patient Instructions (Signed)
Great to see you today! I have refilled your medications.  We will call you with all results.   Congrats Grandma!!!

## 2019-03-25 NOTE — Telephone Encounter (Signed)
Her thyroid panel is normal.  Her hemoglobin is elevated- more so than prior and her hematocrit is also elevated, as well as the size of her blood cells are larger than prior. Some of these changes can be from smoking. However further evaluation to rule out other potential blood disorders are warranted now.  Since they are all increasing- I do feel we need to consider a hematology referral for further evaluation.   I have placed this for her and they will call to schedule.

## 2019-03-26 ENCOUNTER — Encounter: Payer: Self-pay | Admitting: Family Medicine

## 2019-03-27 ENCOUNTER — Ambulatory Visit
Admission: RE | Admit: 2019-03-27 | Discharge: 2019-03-27 | Disposition: A | Discharge status: Home / Self Care | Payer: 59 | Source: Ambulatory Visit | Attending: Sports Medicine | Admitting: Sports Medicine

## 2019-03-27 ENCOUNTER — Telehealth: Payer: Self-pay | Admitting: Adult Health

## 2019-03-27 MED ORDER — TRIAMCINOLONE ACETONIDE 40 MG/ML IJ SUSP (RADIOLOGY)
60.0000 mg | Freq: Once | INTRAMUSCULAR | Status: AC
  Administered 2019-03-27: 60 mg via EPIDURAL

## 2019-03-27 MED ORDER — IOPAMIDOL (ISOVUE-M 300) INJECTION 61%
1.0000 mL | Freq: Once | INTRAMUSCULAR | Status: AC
  Administered 2019-03-27: 1 mL via EPIDURAL

## 2019-03-27 NOTE — Discharge Instructions (Signed)

## 2019-03-27 NOTE — Telephone Encounter (Signed)
Received a new hem referral from Dr Raoul Pitch for macrocytosis. Laura Steele has been cld and scheduled to see [name] on 1/6 at 830am w/labs at 8am. Pt aware to arrive 15 minutes early.

## 2019-04-02 ENCOUNTER — Encounter: Payer: Self-pay | Admitting: Adult Health

## 2019-04-02 ENCOUNTER — Inpatient Hospital Stay: Payer: 59

## 2019-04-02 ENCOUNTER — Other Ambulatory Visit: Payer: Self-pay

## 2019-04-02 ENCOUNTER — Inpatient Hospital Stay: Payer: 59 | Attending: Adult Health | Admitting: Adult Health

## 2019-04-02 VITALS — BP 134/80 | HR 69 | Temp 97.7°F | Resp 18 | Ht 63.0 in | Wt 141.3 lb

## 2019-04-02 DIAGNOSIS — F17210 Nicotine dependence, cigarettes, uncomplicated: Secondary | ICD-10-CM | POA: Diagnosis not present

## 2019-04-02 DIAGNOSIS — D582 Other hemoglobinopathies: Secondary | ICD-10-CM | POA: Insufficient documentation

## 2019-04-02 DIAGNOSIS — E538 Deficiency of other specified B group vitamins: Secondary | ICD-10-CM | POA: Diagnosis not present

## 2019-04-02 DIAGNOSIS — Z800 Family history of malignant neoplasm of digestive organs: Secondary | ICD-10-CM | POA: Insufficient documentation

## 2019-04-02 DIAGNOSIS — D7589 Other specified diseases of blood and blood-forming organs: Secondary | ICD-10-CM | POA: Diagnosis present

## 2019-04-02 DIAGNOSIS — Z90710 Acquired absence of both cervix and uterus: Secondary | ICD-10-CM | POA: Diagnosis not present

## 2019-04-02 LAB — CMP (CANCER CENTER ONLY)
ALT: 18 U/L (ref 0–44)
AST: 25 U/L (ref 15–41)
Albumin: 3.8 g/dL (ref 3.5–5.0)
Alkaline Phosphatase: 57 U/L (ref 38–126)
Anion gap: 14 (ref 5–15)
BUN: 14 mg/dL (ref 6–20)
CO2: 20 mmol/L — ABNORMAL LOW (ref 22–32)
Calcium: 8.5 mg/dL — ABNORMAL LOW (ref 8.9–10.3)
Chloride: 104 mmol/L (ref 98–111)
Creatinine: 0.63 mg/dL (ref 0.44–1.00)
GFR, Est AFR Am: >60 mL/min (ref >60)
GFR, Estimated: >60 mL/min (ref >60)
Glucose, Bld: 78 mg/dL (ref 70–99)
Potassium: 4.2 mmol/L (ref 3.5–5.1)
Sodium: 138 mmol/L (ref 135–145)
Total Bilirubin: 0.3 mg/dL (ref 0.3–1.2)
Total Protein: 7 g/dL (ref 6.5–8.1)

## 2019-04-02 LAB — RETICULOCYTES
Immature Retic Fract: 15.9 % (ref 2.3–15.9)
RBC.: 4.98 MIL/uL (ref 3.87–5.11)
Retic Count, Absolute: 113 10*3/uL (ref 19.0–186.0)
Retic Ct Pct: 2.3 % (ref 0.4–3.1)

## 2019-04-02 LAB — CBC WITH DIFFERENTIAL (CANCER CENTER ONLY)
Abs Immature Granulocytes: 0.11 10*3/uL — ABNORMAL HIGH (ref 0.00–0.07)
Basophils Absolute: 0.1 10*3/uL (ref 0.0–0.1)
Basophils Relative: 1 %
Eosinophils Absolute: 0.2 10*3/uL (ref 0.0–0.5)
Eosinophils Relative: 1 %
HCT: 48.4 % — ABNORMAL HIGH (ref 36.0–46.0)
Hemoglobin: 16.9 g/dL — ABNORMAL HIGH (ref 12.0–15.0)
Immature Granulocytes: 1 %
Lymphocytes Relative: 18 %
Lymphs Abs: 2.1 10*3/uL (ref 0.7–4.0)
MCH: 34.8 pg — ABNORMAL HIGH (ref 26.0–34.0)
MCHC: 34.9 g/dL (ref 30.0–36.0)
MCV: 99.6 fL (ref 80.0–100.0)
Monocytes Absolute: 1.4 10*3/uL — ABNORMAL HIGH (ref 0.1–1.0)
Monocytes Relative: 13 %
Neutro Abs: 7.4 10*3/uL (ref 1.7–7.7)
Neutrophils Relative %: 66 %
Platelet Count: 226 10*3/uL (ref 150–400)
RBC: 4.86 MIL/uL (ref 3.87–5.11)
RDW: 12.7 % (ref 11.5–15.5)
WBC Count: 11.2 10*3/uL — ABNORMAL HIGH (ref 4.0–10.5)
nRBC: 0 % (ref 0.0–0.2)

## 2019-04-02 LAB — SAVE SMEAR(SSMR), FOR PROVIDER SLIDE REVIEW

## 2019-04-02 LAB — FOLATE: Folate: 6.1 ng/mL (ref >5.9)

## 2019-04-02 LAB — VITAMIN B12: Vitamin B-12: 247 pg/mL (ref 180–914)

## 2019-04-02 NOTE — Progress Notes (Addendum)
[facility]  [phone] [phone]     ID: Laura Steele DOB: Aug 08, 1975  MR#: [id number]  [id number]  Patient Care Team: Ma Hillock, DO as PCP - General (Family Medicine) Ashok Pall, MD as Consulting Physician (Neurosurgery) Silverio Decamp, MD as Consulting Physician (Sports Medicine) Scot Dock, NP OTHER MD:  CHIEF COMPLAINT: macrocytosis, elevated hemoglobin  CURRENT TREATMENT: observation   HISTORY OF CURRENT ILLNESS:  Laura Steele was referred here today by her PCP, Dr. Raoul Pitch for  A hematology evaluation of her macrocytosis and elevated hemoglobin.  Fortune unsure how long her lab anomalies have been going on.  She says that last year they were elevated slightly and this year more so this year.  She has been diagnosed with B12 and folate deficiency and has been taking B complex for a year.  Her most recent folate was normal, and I could not locate a B12 level in the system.  [name] is an every day smoker since the age of 68, and smokes about one pack per day--28 pack years.    Her MCV over the pats three years are noted below:  Ref. Range 12/04/2016 10:32 10/24/2017 09:01 12/26/2017 09:47 03/25/2019 10:29 04/02/2019 08:22  MCV Latest Ref Range: 80.0 - 100.0 fL 102.7 (H) 103.4 (H) 100.8 (H) 104.6 (H) 99.6   Her Hemoglobin results are also noted below:  Ref. Range 12/04/2016 10:32 10/24/2017 09:01 12/26/2017 09:47 03/25/2019 10:29 04/02/2019 08:22  Hemoglobin Latest Ref Range: 12.0 - 15.0 g/dL 15.8 (H) 16.1 (H) 16.2 (H) 16.6 (H) 16.9 (H)   Her platelets have been normal:  Ref. Range 12/04/2016 10:32 10/24/2017 09:01 12/26/2017 09:47 03/25/2019 10:29 04/02/2019 08:22  Platelets Latest Ref Range: 150 - 400 K/uL 243.0 234.0 227.0 201.0 226    Her WBC have been normal, until today:   Ref. Range 12/04/2016 10:32 10/24/2017 09:01 12/26/2017 09:47 03/25/2019 10:29 04/02/2019 08:22  WBC Latest Ref Range: 4.0 - 10.5 K/uL 10.3 9.4 7.7 8.9 11.2  (H)    INTERVAL HISTORY: Laura Steele understands that she is here for evaluation and consultation for her elevated hemoglobin and macrocytosis.  She is an every day smoker and has a current 28 pack year history.  She is not ready to quit.   REVIEW OF SYSTEMS: She has mild shortness of breath and a cough.  She notes this happens from time to time and has been going on for about a year.  She has no unintentional weight loss.  She says she gets hot at night and sleeps with the fans on.  She says that she has some hot flashes during the day but th is is worse at night.   Laura Steele does not follow a particular diet.  She eats fast food about 1 time per week.  She doesn't have time devoted to exercise, but notes her job is a physically active job.    Laura Steele denies any headaches or vision issues.  She has no skin rash or lesion.  She is not fatigued.  She has no lymphadenopathy or easy bruising or bleeding.  She has no bowel/bladder changes, nausea, vomiting, dysphagia, odynophagia.  She is without cough, shortness of breath, palpitations, or chest pain.  A detailed ROS was otherwise non contributory.    PAST MEDICAL HISTORY: Past Medical History:  Diagnosis Date  . Allergy   . Anxiety   . Hypoglycemia     PAST SURGICAL HISTORY: Past Surgical History:  Procedure Laterality Date  . ABDOMINAL HYSTERECTOMY    .  ANTERIOR CERVICAL DISCECTOMY  03/2017   C6-7; Dr. Christella Noa    FAMILY HISTORY Family History  Problem Relation Age of Onset  . Diabetes Mother   . Breast cancer Mother        had masectomy.   . Skin cancer Maternal Grandfather     GYNECOLOGIC HISTORY:  No LMP recorded. Patient has had a hysterectomy. Menarche: patient cannot remember Age at first live birth: 44 years old Laura Steele 3 LMP s/Steele TAH Contraceptive none HRT none  Hysterectomy? Several years ago Salpingo-oophorectomy? One removed she thinks, one remains    SOCIAL HISTORY:  [name] lives in [place], [place] with her husband  and step son.  Her step son is 57 years old.  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  She is a current every day smoker.  She drinks one beer every night to go to sleep.  No illicit drug use.  She has 3 kids who are her oldest son, Laura Steele, 41 years old and lives in [place], [place]; her 30 year old daughter Laura Steele who is pregnant with Simon's first grandson lives in [place], [place]; and Laura Steele her 55 year old son who was born with hydrocephalus, and lives in [place].       ADVANCED DIRECTIVES: not in place   HEALTH MAINTENANCE: Social History   Tobacco Use  . Smoking status: Current Every Day Smoker    Packs/day: 0.50    Types: Cigarettes  . Smokeless tobacco: Never Used  Substance Use Topics  . Alcohol use: Yes    Comment: Pt states was drinkning 40 0z of beer a night. but stopped since sunday.  . Drug use: No     Colonoscopy: never  PAP: none since TAH  Bone density: never  Mammogram: overdue--recommended   No Known Allergies  Current Outpatient Medications  Medication Sig Dispense Refill  . B Complex-C (B-COMPLEX WITH VITAMIN C) tablet Take 1 tablet by mouth daily.    . Baclofen 5 MG TABS Take 5 mg by mouth 3 (three) times daily as needed. 90 tablet 5  . DULoxetine (CYMBALTA) 30 MG capsule TAKE 1 CAPSULE BY MOUTH EVERY DAY 90 capsule 1  . gabapentin (NEURONTIN) 300 MG capsule 1 capsule in the morning, 2 capsules at night 270 capsule 3  . glycopyrrolate (ROBINUL) 1 MG tablet Take 1 tablet (1 mg total) by mouth daily. 90 tablet 1  . hydrOXYzine (ATARAX/VISTARIL) 25 MG tablet 1-2 tabs PO at bedtime 180 tablet 1  . levothyroxine (SYNTHROID) 25 MCG tablet Take 1 tablet (25 mcg total) by mouth daily. 90 tablet 3  . meloxicam (MOBIC) 15 MG tablet TAKE 1 TABLET BY MOUTH IN THE MORNING WITH BREAKFAST FOR 2 WEEKS, THEN DAILY AS NEEDED FOR PAIN 90 tablet 3   No current facility-administered medications for this visit.    OBJECTIVE:  Vitals:   04/02/19 0906    BP: 134/80  Pulse: 69  Resp: 18  Temp: 97.7 F (36.5 C)  SpO2: 99%     Body mass index is 25.03 kg/m.   Wt Readings from Last 3 Encounters:  04/02/19 141 lb 4.8 oz (64.1 kg)  03/25/19 137 lb 8 oz (62.4 kg)  03/12/19 136 lb (61.7 kg)      ECOG FS:1 - Symptomatic but completely ambulatory GENERAL: Patient is a well appearing female in no acute distress HEENT:  Sclerae anicteric. Mask in place. Neck is supple.  NODES:  No cervical, supraclavicular, or axillary lymphadenopathy palpated.  LUNGS:  Clear to auscultation bilaterally.  No wheezes or rhonchi. HEART:  Regular rate and rhythm. No murmur appreciated. ABDOMEN:  Soft, nontender.  Positive, normoactive bowel sounds. No organomegaly palpated. MSK:  No focal spinal tenderness to palpation. Full range of motion bilaterally in the upper extremities. EXTREMITIES:  No peripheral edema.   SKIN:  Clear with no obvious rashes or skin changes. No nail dyscrasia. NEURO:  Nonfocal. Well oriented.  Appropriate affect.     LAB RESULTS:  CMP     Component Value Date/Time   NA 138 10/03/2018 1130   K 4.0 10/03/2018 1130   CL 105 10/03/2018 1130   CO2 24 10/03/2018 1130   GLUCOSE 91 10/03/2018 1130   BUN 5 (L) 10/03/2018 1130   CREATININE 0.44 10/03/2018 1130   CREATININE 0.63 01/17/2018 1144   CALCIUM 8.5 10/03/2018 1130   PROT 6.0 12/04/2016 1032   ALBUMIN 3.9 12/04/2016 1032   AST 23 12/04/2016 1032   ALT 18 12/04/2016 1032   ALKPHOS 63 12/04/2016 1032   BILITOT 0.5 12/04/2016 1032   GFRNONAA >60 08/01/2015 1003   GFRAA >60 08/01/2015 1003    No results found for: TOTALPROTELP, ALBUMINELP, A1GS, A2GS, BETS, BETA2SER, GAMS, MSPIKE, SPEI  No results found for: Nils Pyle, KAPLAMBRATIO  Lab Results  Component Value Date   WBC 11.2 (H) 04/02/2019   NEUTROABS 7.4 04/02/2019   HGB 16.9 (H) 04/02/2019   HCT 48.4 (H) 04/02/2019   MCV 99.6 04/02/2019   PLT 226 04/02/2019      Chemistry      Component  Value Date/Time   NA 138 10/03/2018 1130   K 4.0 10/03/2018 1130   CL 105 10/03/2018 1130   CO2 24 10/03/2018 1130   BUN 5 (L) 10/03/2018 1130   CREATININE 0.44 10/03/2018 1130   CREATININE 0.63 01/17/2018 1144      Component Value Date/Time   CALCIUM 8.5 10/03/2018 1130   ALKPHOS 63 12/04/2016 1032   AST 23 12/04/2016 1032   ALT 18 12/04/2016 1032   BILITOT 0.5 12/04/2016 1032       No results found for: LABCA2  No components found for: [id number]  No results for input(s): INR in the last 168 hours.  No results found for: LABCA2  No results found for: [id number]  No results found for: [id number]  No results found for: [id number]  No results found for: [id number]  No components found for: HGQUANT  No results found for: CEA1 / No results found for: CEA1   No results found for: AFPTUMOR  No results found for: CHROMOGRNA  No results found for: PSA1  Appointment on 04/02/2019  Component Date Value Ref Range Status  . Smear Review 04/02/2019 SMEAR STAINED AND AVAILABLE FOR REVIEW   Final   Performed at [facility], [address], [place], [place] [address]  . Retic Ct Pct 04/02/2019 2.3  0.4 - 3.1 % Final  . RBC. 04/02/2019 4.98  3.87 - 5.11 MIL/uL Final  . Retic Count, Absolute 04/02/2019 113.0  19.0 - 186.0 K/uL Final  . Immature Retic Fract 04/02/2019 15.9  2.3 - 15.9 % Final   Performed at [facility], [place] [address], [place], [place] [address]  . WBC Count 04/02/2019 11.2* 4.0 - 10.5 K/uL Final  . RBC 04/02/2019 4.86  3.87 - 5.11 MIL/uL Final  . Hemoglobin 04/02/2019 16.9* 12.0 - 15.0 g/dL Final  . HCT 04/02/2019 48.4* 36.0 - 46.0 % Final  .  MCV 04/02/2019 99.6  80.0 - 100.0 fL Final  . MCH 04/02/2019 34.8* 26.0 - 34.0 pg Final  . MCHC 04/02/2019 34.9  30.0 - 36.0 g/dL Final  . RDW 04/02/2019 12.7  11.5 - 15.5 % Final  . Platelet Count 04/02/2019 226  150 - 400 K/uL Final  . nRBC 04/02/2019 0.0  0.0 - 0.2 % Final  .  Neutrophils Relative % 04/02/2019 66  % Final  . Neutro Abs 04/02/2019 7.4  1.7 - 7.7 K/uL Final  . Lymphocytes Relative 04/02/2019 18  % Final  . Lymphs Abs 04/02/2019 2.1  0.7 - 4.0 K/uL Final  . Monocytes Relative 04/02/2019 13  % Final  . Monocytes Absolute 04/02/2019 1.4* 0.1 - 1.0 K/uL Final  . Eosinophils Relative 04/02/2019 1  % Final  . Eosinophils Absolute 04/02/2019 0.2  0.0 - 0.5 K/uL Final  . Basophils Relative 04/02/2019 1  % Final  . Basophils Absolute 04/02/2019 0.1  0.0 - 0.1 K/uL Final  . Immature Granulocytes 04/02/2019 1  % Final  . Abs Immature Granulocytes 04/02/2019 0.11* 0.00 - 0.07 K/uL Final   Performed at [facility], [place] [address] [place], [place] [address]    (this displays the last labs from the last 3 days)  No results found for: TOTALPROTELP, ALBUMINELP, A1GS, A2GS, BETS, BETA2SER, GAMS, MSPIKE, SPEI (this displays SPEP labs)  No results found for: KPAFRELGTCHN, LAMBDASER, KAPLAMBRATIO (kappa/lambda light chains)  No results found for: HGBA, HGBA2QUANT, HGBFQUANT, HGBSQUAN (Hemoglobinopathy evaluation)   No results found for: LDH  No results found for: IRON, TIBC, IRONPCTSAT (Iron and TIBC)  No results found for: FERRITIN  Urinalysis No results found for: COLORURINE, APPEARANCEUR, LABSPEC, PHURINE, GLUCOSEU, HGBUR, BILIRUBINUR, KETONESUR, PROTEINUR, UROBILINOGEN, NITRITE, LEUKOCYTESUR   STUDIES:    ASSESSMENT: 44 y.o. with elevated hemoglobin worsening since 2018 and macrocytosis.    1. Hemoglobin 04/02/2019 is 16.9 and is the highest it has been.    (a) Erythropoeitin level pending--JAK-2 will be ordered depending on these results.    (b) B12, folate pending  2. Tobacco abuse  (a) smokes 1 pack per day x 28 years  (b) recommended smoking cessation on 04/02/2019  (c) will be eligible for lung cancer screening when she meets age criteria    PLAN: Derinda spent about 30 minutes with me and then another 20  minutes with Dr. Jana Hakim discussing her lab abnormalities and what they mean.    We reviewed with Korine that she has 3 types of blood cells in her CBC, those are her White blood cells, her Platelets, and her Red Blood Cells.  We reviewed her blood smear under the microscope and discussed that her WBC look normal, and they are slightly elevated today, which could just be a variation.  Her platelets which help with clotting are normal.  Her Red Blood Cells which carry oxygen through the body are abnormal.  Dr. Jana Hakim discussed that when you have decreased red blood cells the term is "anemia", and when you have increased red blood cells, the term used is "polycythemia".  Delylah has polycythemia.  Polycythemia can be caused by two main reasons.  First, there can be reactive, or secondary polycythemia.  That means that the polycythemia is due to a lack of oxygen.  Causes of secondary polycythemia include living at an increased elevation, such as in [place], or cigarette smoking, among other causes of decreased oxygen.  This could very well be what Lynnlee's elevated hemoglobin is related to  since she has such an extensive tobacco history and continues to smoke.   The other type of polycythemia is primary polycythemia.  This is caused by a genetic issue, and is called polycythemia Vera.  This can be associated with blood clots and can become a leukemia.  This type of polycythemia needs to be followed by a hematologist.    So how can we tell whether Rayan has primary or secondary polycythemia?  We explained that her kidneys have an oxygen sensor on them, and when they notice the oxygen level is decreased, they will make a hormone called erythropoietin (EPO) that will go to the marrow to stimulate RBC production.  If her elevated RBCs are secondary to decreased oxygen then her EPO level will be increased.  If this level is low, however, we will need Siya to return so she can undergo JAK2 testing for  evaluation for Polycythemia Vera.    We reviewed that it takes up to a week to receive the EPO level and we will call her with these results to let her know whether or not she will need to return for JAK-2 testing, or need further follow up with [place].    Genola has a good understanding of the overall plan. She agrees with it. She was recommended to continue with the appropriate pandemic precautions. She knows that she can call us if she has any questions that may arise at any point in the future, or if she needs to be seen.     [name], NP   04/02/2019 9:18 AM Medical Oncology and Hematology [facility] [address] [place], [place] [address] Tel. [phone]    Fax. [phone]   ADDENDUM: I discussed with Cledith the way the body regulates red cell production, the role of the kidneys in this, and specifically the functions of erythropoietin.  She understands that hypoxia kicks in increased erythropoietin production and that smoking causes hypoxia not only because the lungs do not work as well but because of carbon monoxide which is a byproduct.  Accordingly at this meeting we decided we would only do an erythropoietin level and if it was elevated we would stop the work-up as we had an explanation for her erythrocytosis.  The erythropoietin level however returned in the low normal level.  I think we have to rule out polycythemia vera since the management is completely different.  Accordingly Berneita is being called back for JAK2 gene testing  We also discussed smoking cessation.  I gave her several options to consider.  I personally saw this patient and performed a substantive portion of this encounter with the listed APP documented above.   Total encounter time 50 minutes*  Chauncey Cruel, MD Medical Oncology and Hematology [facility] [address] [place], [place] [address] Tel. [phone]    Fax. [phone]  *Total Encounter Time  as defined by the Centers for Medicare and Medicaid Services includes, in addition to the face-to-face time of a patient visit (documented in the note above) non-face-to-face time: obtaining and reviewing outside history, ordering and reviewing medications, tests or procedures, care coordination (communications with other health care professionals or caregivers) and documentation in the medical record.

## 2019-04-03 ENCOUNTER — Ambulatory Visit (INDEPENDENT_AMBULATORY_CARE_PROVIDER_SITE_OTHER): Payer: 59 | Admitting: Vascular Surgery

## 2019-04-03 ENCOUNTER — Encounter: Payer: Self-pay | Admitting: Vascular Surgery

## 2019-04-03 VITALS — BP 123/83 | HR 86 | Temp 98.4°F | Resp 20 | Ht 63.0 in | Wt 139.5 lb

## 2019-04-03 DIAGNOSIS — R202 Paresthesia of skin: Secondary | ICD-10-CM | POA: Diagnosis not present

## 2019-04-03 DIAGNOSIS — R200 Anesthesia of skin: Secondary | ICD-10-CM

## 2019-04-03 LAB — ERYTHROPOIETIN: Erythropoietin: 5.7 m[IU]/mL (ref 2.6–18.5)

## 2019-04-03 NOTE — Progress Notes (Signed)
Referring Physician: Dr Bartholome Bill  Patient name: Laura Steele MRN: [id number] DOB: 09/05/1975 Sex: female  REASON FOR CONSULT: Left arm numbness  HPI: Laura Steele is a 44 y.o. female, with a 2-year history of numbness and tingling in her left arm.  States that she had a procedure done on her cervical spine for this and her symptoms improved for a few months but then returned.  She also has some weakness in the left hand and arm.  She has never had any evidence of embolic events to the left upper extremity swelling in the left upper extremity or development of chest wall collaterals.  She did do a course of physical therapy a few months ago for her symptoms and got really no relief.  She also underwent dry needling with no relief.  She also has symptoms of tightness and pain in the base of her left neck area.     Past Medical History:  Diagnosis Date  . Allergy   . Anxiety   . Hypoglycemia    Past Surgical History:  Procedure Laterality Date  . ABDOMINAL HYSTERECTOMY    . ANTERIOR CERVICAL DISCECTOMY  03/2017   C6-7; Dr. Christella Noa    Family History  Problem Relation Age of Onset  . Diabetes Mother   . Breast cancer Mother        had masectomy.   . Skin cancer Maternal Grandfather     SOCIAL HISTORY: Social History   Socioeconomic History  . Marital status: Married    Spouse name: Not on file  . Number of children: Not on file  . Years of education: Not on file  . Highest education level: Not on file  Occupational History  . Not on file  Tobacco Use  . Smoking status: Current Every Day Smoker    Packs/day: 1.00    Types: Cigarettes  . Smokeless tobacco: Never Used  Substance and Sexual Activity  . Alcohol use: Yes    Comment: Pt states was drinkning 40 0z of beer a night. but stopped since sunday.  . Drug use: No  . Sexual activity: Yes  Other Topics Concern  . Not on file  Social History Narrative  . Not on file   Social Determinants of Health    Financial Resource Strain:   . Difficulty of Paying Living Expenses: Not on file  Food Insecurity:   . Worried About Charity fundraiser in the Last Year: Not on file  . Ran Out of Food in the Last Year: Not on file  Transportation Needs:   . Lack of Transportation (Medical): Not on file  . Lack of Transportation (Non-Medical): Not on file  Physical Activity:   . Days of Exercise per Week: Not on file  . Minutes of Exercise per Session: Not on file  Stress:   . Feeling of Stress : Not on file  Social Connections:   . Frequency of Communication with Friends and Family: Not on file  . Frequency of Social Gatherings with Friends and Family: Not on file  . Attends Religious Services: Not on file  . Active Member of Clubs or Organizations: Not on file  . Attends Archivist Meetings: Not on file  . Marital Status: Not on file  Intimate Partner Violence:   . Fear of Current or Ex-Partner: Not on file  . Emotionally Abused: Not on file  . Physically Abused: Not on file  . Sexually Abused: Not on file    No  Known Allergies  Current Outpatient Medications  Medication Sig Dispense Refill  . B Complex-C (B-COMPLEX WITH VITAMIN C) tablet Take 1 tablet by mouth daily.    . baclofen (LIORESAL) 10 MG tablet Take 5 mg by mouth 3 (three) times daily as needed.    . DULoxetine (CYMBALTA) 30 MG capsule TAKE 1 CAPSULE BY MOUTH EVERY DAY 90 capsule 1  . gabapentin (NEURONTIN) 300 MG capsule 1 capsule in the morning, 2 capsules at night 270 capsule 3  . glycopyrrolate (ROBINUL) 1 MG tablet Take 1 tablet (1 mg total) by mouth daily. 90 tablet 1  . hydrOXYzine (ATARAX/VISTARIL) 25 MG tablet 1-2 tabs PO at bedtime 180 tablet 1  . levothyroxine (SYNTHROID) 25 MCG tablet Take 1 tablet (25 mcg total) by mouth daily. 90 tablet 3  . meloxicam (MOBIC) 15 MG tablet TAKE 1 TABLET BY MOUTH IN THE MORNING WITH BREAKFAST FOR 2 WEEKS, THEN DAILY AS NEEDED FOR PAIN 90 tablet 3   No current  facility-administered medications for this visit.    ROS:   General:  No weight loss, Fever, chills  HEENT: No recent headaches, no nasal bleeding, no visual changes, no sore throat  Neurologic: No dizziness, blackouts, seizures. No recent symptoms of stroke or mini- stroke. No recent episodes of slurred speech, or temporary blindness.  Cardiac: No recent episodes of chest pain/pressure, no shortness of breath at rest.  No shortness of breath with exertion.  Denies history of atrial fibrillation or irregular heartbeat  Vascular: No history of rest pain in feet.  No history of claudication.  No history of non-healing ulcer, No history of DVT   Pulmonary: No home oxygen, no productive cough, no hemoptysis,  No asthma or wheezing  Musculoskeletal:  [ ]  Arthritis, [ ]  Low back pain,  [ ]  Joint pain  Hematologic:No history of hypercoagulable state.  No history of easy bleeding.  No history of anemia  Gastrointestinal: No hematochezia or melena,  No gastroesophageal reflux, no trouble swallowing  Urinary: [ ]  chronic Kidney disease, [ ]  on HD - [ ]  MWF or [ ]  TTHS, [ ]  Burning with urination, [ ]  Frequent urination, [ ]  Difficulty urinating;   Skin: No rashes  Psychological: No history of anxiety,  No history of depression   Physical Examination  Vitals:   04/03/19 1001  BP: 123/83  Pulse: 86  Resp: 20  Temp: 98.4 F (36.9 C)  SpO2: 93%  Weight: 139 lb 8 oz (63.3 kg)  Height: 5\' 3"  (1.6 m)    Body mass index is 24.71 kg/m.  General:  Alert and oriented, no acute distress HEENT: Normal Neck: No bruit or JVD Pulmonary: Clear to auscultation bilaterally Cardiac: Regular Rate and Rhythm without murmur Abdomen: Soft, non-tender, non-distended, no mass, no scars Skin: No rash Extremity Pulses:  2+ radial, brachial, femoral, dorsalis pedis, posterior tibial pulses bilaterally Musculoskeletal: No deformity or edema  Neurologic: Upper and lower extremity motor 5/5 and  symmetric  DATA:  I reviewed a C-spine x-ray from 2018 which shows no cervical ribs.  She had an MRI of the cervical spine November 2019 which showed no compression of the brachial plexus.  He had an MRI of the chest November 2020 which showed no abnormality of the paraspinal soft tissues or brachial plexus compression  ASSESSMENT: Patient with left arm numbness and weakness no evidence of arterial or venous obstruction as an etiology.  Although some of her symptoms could potentially be neurogenic thoracic outlet so far her  imaging has not really correlated with that.  She also states that her symptoms are similar to prior to her cervical spine operation.   PLAN: We will obtain a CT angiogram of the chest to see if there is any evidence of vascular compression since the brachial plexus itself does not seem to be inflamed or with any compression on prior MRI.  If this is normal I would recommend potentially more thorough physical therapy and further follow-up with Dr. Bartholome Bill  I will see her back in follow-up after her CT scan.   Ruta Hinds, MD [facility] [place] Office: [phone] Pager: [phone]

## 2019-04-04 ENCOUNTER — Telehealth: Payer: Self-pay | Admitting: Adult Health

## 2019-04-04 ENCOUNTER — Other Ambulatory Visit: Payer: Self-pay | Admitting: Adult Health

## 2019-04-04 ENCOUNTER — Other Ambulatory Visit: Payer: Self-pay

## 2019-04-04 ENCOUNTER — Other Ambulatory Visit: Payer: Self-pay | Admitting: Oncology

## 2019-04-04 ENCOUNTER — Telehealth: Payer: Self-pay | Admitting: *Deleted

## 2019-04-04 DIAGNOSIS — D582 Other hemoglobinopathies: Secondary | ICD-10-CM

## 2019-04-04 DIAGNOSIS — R200 Anesthesia of skin: Secondary | ICD-10-CM

## 2019-04-04 DIAGNOSIS — R202 Paresthesia of skin: Secondary | ICD-10-CM

## 2019-04-04 NOTE — Telephone Encounter (Signed)
-----   Message from Gardenia Phlegm, NP sent at 04/04/2019 11:56 AM EST ----- Please call patient and tell her that I sent a schedule message for her to return next week for jak 2 testing to further evaluate her elevated hemoglobin.   ----- Message ----- From: Gardenia Phlegm, NP Sent: 04/03/2019   8:27 AM EST To: Chauncey Cruel, MD  [name] has elevated hemoglobin and normal EPO of 5.7.  Jak 2? ----- Message ----- From: Interface, Lab In [place] Sent: 04/02/2019   8:46 AM EST To: Gardenia Phlegm, NP

## 2019-04-04 NOTE — Progress Notes (Signed)
Patient EPO level is low, she will need to return for lab appointment to test JAK2.    [name], NP

## 2019-04-04 NOTE — Telephone Encounter (Signed)
Notified of message below

## 2019-04-04 NOTE — Telephone Encounter (Signed)
Scheduled appt per 1/8 sch message - pt aware of appt date and time   

## 2019-04-07 ENCOUNTER — Other Ambulatory Visit: Payer: Self-pay

## 2019-04-07 ENCOUNTER — Inpatient Hospital Stay: Payer: 59

## 2019-04-07 DIAGNOSIS — D582 Other hemoglobinopathies: Secondary | ICD-10-CM

## 2019-04-16 ENCOUNTER — Telehealth: Payer: Self-pay

## 2019-04-16 NOTE — Telephone Encounter (Signed)
Spoke with patient to inform that JAK2 results are negative.  Patient voiced understanding and thanks for call.

## 2019-04-18 ENCOUNTER — Ambulatory Visit (INDEPENDENT_AMBULATORY_CARE_PROVIDER_SITE_OTHER): Payer: 59 | Admitting: Sports Medicine

## 2019-04-18 DIAGNOSIS — M5030 Other cervical disc degeneration, unspecified cervical region: Secondary | ICD-10-CM | POA: Diagnosis not present

## 2019-04-18 LAB — JAK2 (INCLUDING V617F AND EXON 12), MPL,& CALR-NEXT GEN SEQ

## 2019-04-18 NOTE — Assessment & Plan Note (Signed)
Laura Steele returns, she had her cervical epidural about 3 weeks ago. She has done well, she has a bit of discomfort in the morning but overall is happy with how things are going and does not need a second epidural.

## 2019-04-18 NOTE — Progress Notes (Signed)
   Virtual Visit via WebEx/MyChart   I connected with  Laura Steele  on 04/18/19 via WebEx/MyChart/Doximity Video and verified that I am speaking with the correct person using two identifiers.   I discussed the limitations, risks, security and privacy concerns of performing an evaluation and management service by WebEx/MyChart/Doximity Video, including the higher likelihood of inaccurate diagnosis and treatment, and the availability of in person appointments.  We also discussed the likely need of an additional face to face encounter for complete and high quality delivery of care.  I also discussed with the patient that there may be a patient responsible charge related to this service. The patient expressed understanding and wishes to proceed.  Provider location is either at home or medical facility. Patient location is at their home, different from provider location. People involved in care of the patient during this telehealth encounter were myself, my nurse/medical assistant, and my front office/scheduling team member.  Review of Systems: No fevers, chills, night sweats, weight loss, chest pain, or shortness of breath.   Objective Findings:    General: Speaking full sentences, no audible heavy breathing.  Sounds alert and appropriately interactive.  Appears well.  Face symmetric.  Extraocular movements intact.  Pupils equal and round.  No nasal flaring or accessory muscle use visualized.  Independent interpretation of tests performed by another provider:   None.  Impression and Recommendations:    DDD (degenerative disc disease), cervical Laura Steele returns, she had Steele cervical epidural about 3 weeks ago. She has done well, she has a bit of discomfort in the morning but overall is happy with how things are going and does not need a second epidural.    I discussed the above assessment and treatment plan with the patient. The patient was provided an opportunity to ask questions and all  were answered. The patient agreed with the plan and demonstrated an understanding of the instructions.   The patient was advised to call back or seek an in-person evaluation if the symptoms worsen or if the condition fails to improve as anticipated.   I provided 30 minutes of face to face and non-face-to-face time during this encounter date, time was needed to gather information, review chart, records, communicate/coordinate with staff remotely, as well as complete documentation.   ___________________________________________ Laura Steele. Dianah Field, M.D., ABFM., CAQSM. Primary Care and [place] Instructor of [place] of [facility] of Medicine

## 2019-04-20 ENCOUNTER — Encounter: Payer: Self-pay | Admitting: Family Medicine

## 2019-04-21 ENCOUNTER — Other Ambulatory Visit: Payer: 59

## 2019-04-24 ENCOUNTER — Ambulatory Visit: Payer: 59 | Admitting: Vascular Surgery

## 2019-05-02 ENCOUNTER — Ambulatory Visit
Admission: RE | Admit: 2019-05-02 | Discharge: 2019-05-02 | Disposition: A | Discharge status: Home / Self Care | Payer: 59 | Source: Ambulatory Visit | Attending: Vascular Surgery | Admitting: Vascular Surgery

## 2019-05-02 DIAGNOSIS — R200 Anesthesia of skin: Secondary | ICD-10-CM

## 2019-05-02 DIAGNOSIS — R202 Paresthesia of skin: Secondary | ICD-10-CM

## 2019-05-02 MED ORDER — IOPAMIDOL (ISOVUE-370) INJECTION 76%
75.0000 mL | Freq: Once | INTRAVENOUS | Status: AC | PRN
  Administered 2019-05-02: 09:00:00 75 mL via INTRAVENOUS

## 2019-05-29 ENCOUNTER — Other Ambulatory Visit: Payer: Self-pay

## 2019-05-29 ENCOUNTER — Encounter: Payer: Self-pay | Admitting: Vascular Surgery

## 2019-05-29 ENCOUNTER — Ambulatory Visit (INDEPENDENT_AMBULATORY_CARE_PROVIDER_SITE_OTHER): Payer: 59 | Admitting: Vascular Surgery

## 2019-05-29 VITALS — BP 108/79 | HR 72 | Temp 98.0°F | Resp 20 | Ht 63.0 in | Wt 140.0 lb

## 2019-05-29 DIAGNOSIS — R911 Solitary pulmonary nodule: Secondary | ICD-10-CM | POA: Diagnosis not present

## 2019-05-29 DIAGNOSIS — R200 Anesthesia of skin: Secondary | ICD-10-CM | POA: Diagnosis not present

## 2019-05-29 NOTE — Progress Notes (Signed)
Patient is a 44 year old female who returns for follow-up today.  She was last seen in January 2021.  She is under evaluation for possible neurogenic or vascular thoracic outlet syndrome.  She was noted to have decreased pulse when her arm was elevated above her head.  She returns after recent CT angiogram of the chest.  She states her symptoms may be a little bit better.  She noted minimal benefit with physical therapy.  She is continue to be followed by Dr. Dianah Field who is considering further epidural injections.  Review of systems: She has shortness of breath with activity.  She also has a chronic cough.  Physical exam:  Vitals:   05/29/19 0941  BP: 108/79  Pulse: 72  Resp: 20  Temp: 98 F (36.7 C)  SpO2: 97%  Weight: 140 lb (63.5 kg)  Height: 5\' 3"  (1.6 m)    Extremities: 2+ brachial radial pulse bilaterally  Neuro: Symmetric upper extremity lower extremity motor strength 5/5  Data: I reviewed her CT angiogram of the chest.  This shows no extrinsic compression of the thoracic outlet from a vascular standpoint.  I reviewed all of these images.  Of note she did have a 4 mm nodule in the right lower lobe of her lung and recommendation was for a repeat CT scan of the chest in 1 year.  Assessment: Fairly extensive work-up at this point not really diagnostic of thoracic outlet syndrome.  She is could still have a neurogenic component but most likely at this point I do not believe she would benefit from a first rib resection.  If her symptoms worsen over time or become more chronic certainly we could reconsider this.  Plan: Patient will follow up in 1 year with a CT scan of the chest.  I will leave this for her primary care physician for scheduling and follow-up.  She will follow-up with [place] on an as-needed basis.  Ruta Hinds, MD [facility] Office: [phone]

## 2019-07-17 ENCOUNTER — Encounter: Payer: Self-pay | Admitting: Family Medicine

## 2019-08-15 ENCOUNTER — Other Ambulatory Visit: Payer: Self-pay | Admitting: Sports Medicine

## 2019-08-15 DIAGNOSIS — G8929 Other chronic pain: Secondary | ICD-10-CM

## 2019-09-09 ENCOUNTER — Ambulatory Visit (INDEPENDENT_AMBULATORY_CARE_PROVIDER_SITE_OTHER): Payer: 59 | Admitting: Sports Medicine

## 2019-09-09 ENCOUNTER — Encounter: Payer: Self-pay | Admitting: Sports Medicine

## 2019-09-09 ENCOUNTER — Other Ambulatory Visit: Payer: Self-pay

## 2019-09-09 DIAGNOSIS — M5030 Other cervical disc degeneration, unspecified cervical region: Secondary | ICD-10-CM

## 2019-09-09 NOTE — Assessment & Plan Note (Addendum)
This is a pleasant 44 year old female with known cervical DDD, her last cervical epidural was at the end of December and she did extremely well, now having recurrence of discomfort in her neck with radiation down her left arm. Ordering a repeat epidural, I will order this as a standing order, and she can get this done x3 monthly if needed.

## 2019-09-09 NOTE — Progress Notes (Signed)
    Procedures performed today:    None.  Independent interpretation of notes and tests performed by another provider:   None.  Brief History, Exam, Impression, and Recommendations:    DDD (degenerative disc disease), cervical This is a pleasant 44 year old female with known cervical DDD, her last cervical epidural was at the end of December and she did extremely well, now having recurrence of discomfort in her neck with radiation down her left arm. Ordering a repeat epidural, I will order this as a standing order, and she can get this done x3 monthly if needed.     ___________________________________________ Gwen Her. Dianah Field, M.D., ABFM., CAQSM. Primary Care and [place] Instructor of [place] of [facility] of Medicine

## 2019-09-10 ENCOUNTER — Other Ambulatory Visit: Payer: Self-pay | Admitting: Sports Medicine

## 2019-09-10 DIAGNOSIS — M5030 Other cervical disc degeneration, unspecified cervical region: Secondary | ICD-10-CM

## 2019-09-18 ENCOUNTER — Other Ambulatory Visit: Payer: Self-pay

## 2019-09-18 ENCOUNTER — Ambulatory Visit
Admission: RE | Admit: 2019-09-18 | Discharge: 2019-09-18 | Disposition: A | Discharge status: Home / Self Care | Payer: 59 | Source: Ambulatory Visit | Attending: Sports Medicine | Admitting: Sports Medicine

## 2019-09-18 DIAGNOSIS — M5030 Other cervical disc degeneration, unspecified cervical region: Secondary | ICD-10-CM

## 2019-09-18 MED ORDER — TRIAMCINOLONE ACETONIDE 40 MG/ML IJ SUSP (RADIOLOGY)
60.0000 mg | Freq: Once | INTRAMUSCULAR | Status: AC
  Administered 2019-09-18: 60 mg via EPIDURAL

## 2019-09-18 MED ORDER — IOPAMIDOL (ISOVUE-M 300) INJECTION 61%
1.0000 mL | Freq: Once | INTRAMUSCULAR | Status: AC | PRN
  Administered 2019-09-18: 1 mL via EPIDURAL

## 2019-09-18 NOTE — Discharge Instructions (Signed)

## 2019-09-19 ENCOUNTER — Encounter: Payer: Self-pay | Admitting: Family Medicine

## 2019-09-26 ENCOUNTER — Encounter: Payer: Self-pay | Admitting: Family Medicine

## 2019-09-26 ENCOUNTER — Other Ambulatory Visit: Payer: Self-pay

## 2019-09-26 ENCOUNTER — Ambulatory Visit (INDEPENDENT_AMBULATORY_CARE_PROVIDER_SITE_OTHER): Payer: 59 | Admitting: Family Medicine

## 2019-09-26 VITALS — BP 125/84 | HR 74 | Temp 97.9°F | Resp 18 | Ht 63.0 in | Wt 138.2 lb

## 2019-09-26 DIAGNOSIS — J329 Chronic sinusitis, unspecified: Secondary | ICD-10-CM

## 2019-09-26 DIAGNOSIS — L74510 Primary focal hyperhidrosis, axilla: Secondary | ICD-10-CM | POA: Diagnosis not present

## 2019-09-26 DIAGNOSIS — F17200 Nicotine dependence, unspecified, uncomplicated: Secondary | ICD-10-CM

## 2019-09-26 DIAGNOSIS — B9689 Other specified bacterial agents as the cause of diseases classified elsewhere: Secondary | ICD-10-CM

## 2019-09-26 DIAGNOSIS — J9801 Acute bronchospasm: Secondary | ICD-10-CM | POA: Diagnosis not present

## 2019-09-26 DIAGNOSIS — F419 Anxiety disorder, unspecified: Secondary | ICD-10-CM

## 2019-09-26 MED ORDER — DULOXETINE HCL 30 MG PO CPEP: ORAL_CAPSULE | ORAL | 1 refills | Status: DC

## 2019-09-26 MED ORDER — CETIRIZINE HCL 10 MG PO TABS: 10.0000 mg | ORAL_TABLET | Freq: Every day | ORAL | 11 refills | Status: DC

## 2019-09-26 MED ORDER — AZITHROMYCIN 250 MG PO TABS
ORAL_TABLET | ORAL | 0 refills | Status: DC
Start: 2019-09-26 — End: 2019-11-28

## 2019-09-26 MED ORDER — GLYCOPYRROLATE 1 MG PO TABS: 1.0000 mg | ORAL_TABLET | Freq: Every day | ORAL | 1 refills | Status: DC

## 2019-09-26 MED ORDER — ALBUTEROL SULFATE HFA 108 (90 BASE) MCG/ACT IN AERS: 1.0000 | INHALATION_SPRAY | Freq: Every day | RESPIRATORY_TRACT | 5 refills | Status: DC | PRN

## 2019-09-26 NOTE — Patient Instructions (Addendum)
Start zyrtecl at night- For allergies.  Mucinex can be helpful.  z-pack prescribed.  Albuterol inhaler only if needed once daily.  Referral to pulmonology for evaluation  Try to cut back on the smoking and consider quitting.       Bronchospasm, Adult  Bronchospasm is when airways in the lungs get smaller. When this happens, it can be hard to breathe. You may cough. You may also make a whistling sound when you breathe (wheeze). Follow these instructions at home: Medicines  Take over-the-counter and prescription medicines only as told by your doctor.  If you need to use an inhaler or nebulizer to take your medicine, ask your doctor how to use it.  If you were given a spacer, always use it with your inhaler. Lifestyle  Change your heating and air conditioning filter. Do this at least once a month.  Try not to use fireplaces and wood stoves.  Do not  smoke. Do not  allow smoking in your home.  Try not to use things that have a strong smell, like perfume.  Get rid of pests (such as roaches and mice) and their poop.  Remove any mold from your home.  Keep your house clean. Get rid of dust.  Use cleaning products that have no smell.  Replace carpet with wood, tile, or vinyl flooring.  Use allergy-proof pillows, mattress covers, and box spring covers.  Wash bed sheets and blankets every week. Use hot water. Dry them in a dryer.  Use blankets that are made of polyester or cotton.  Wash your hands often.  Keep pets out of your bedroom.  When you exercise, try not to breathe in cold air. General instructions  Have a plan for getting medical care. Know these things: ? When to call your doctor. ? When to call local emergency services (911 in the [place] ? Where to go in an emergency.  Stay up to date on your shots (immunizations).  When you have an episode: ? Stay calm. ? Relax. ? Breathe slowly. Contact a doctor if:  Your muscles ache.  Your chest  hurts.  The color of the mucus you cough up (sputum) changes from clear or white to yellow, green, gray, or bloody.  The mucus you cough up gets thicker.  You have a fever. Get help right away if:  The whistling sound gets worse, even after you take your medicines.  Your coughing gets worse.  You find it even harder to breathe.  Your chest hurts very much. Summary  Bronchospasm is when airways in the lungs get smaller.  When this happens, it can be hard to breathe. You may cough. You may also make a whistling sound when you breathe.  Stay away from things that cause you to have episodes. These include smoke or dust. This information is not intended to replace advice given to you by your health care provider. Make sure you discuss any questions you have with your health care provider. Document Revised: 02/23/2017 Document Reviewed: 03/16/2016 Elsevier Patient Education  2020 [employer].

## 2019-09-26 NOTE — Progress Notes (Signed)
This visit occurred during the SARS-CoV-2 public health emergency.  Safety protocols were in place, including screening questions prior to the visit, additional usage of staff PPE, and extensive cleaning of exam room while observing appropriate contact time as indicated for disinfecting solutions.    Laura Steele , 02/08/1976, 44 y.o., female MRN: [id number] Patient Care Team    Relationship Specialty Notifications Start End  Ma Hillock, DO PCP - General Family Medicine  10/22/15   Ashok Pall, MD Consulting Physician Neurosurgery  06/22/17   Silverio Decamp, MD Consulting Physician Sports Medicine  06/22/17     Chief Complaint  Patient presents with  . Anxiety    Pt has smokers cough at night and in the AM. She feels she is having anxiety when this happens bc she feels she cannot breathe.      Subjective: Pt presents for an OV with complaints of cough in the morning and at night when laying flat that causes her to lose her breath. She is a smoker. She does not have interest in quitting. The coughing episodes cause her anxiety and she feels she can not breathe. She has had increase in congestion in her sinus over the last 2 weeks as well.   Depression screen [facility] 2/9 10/24/2017 06/22/2017 12/18/2016 12/04/2016 06/09/2016  Decreased Interest 0 1 0 - 1  Down, Depressed, Hopeless 0 0 0 0 0  PHQ - 2 Score 0 1 0 0 1  Altered sleeping - 2 - 0 1  Tired, decreased energy - 3 - 0 2  Change in appetite - 2 - 0 0  Feeling bad or failure about yourself  - 0 - 0 0  Trouble concentrating - 0 - 0 0  Moving slowly or fidgety/restless - 0 - 0 1  Suicidal thoughts - 0 - 0 0  PHQ-9 Score - 8 - 0 5  Difficult doing work/chores - Somewhat difficult - - -    No Known Allergies Social History   Social History Narrative  . Not on file   Past Medical History:  Diagnosis Date  . Allergy   . Anxiety   . Hypoglycemia    Past Surgical History:  Procedure Laterality Date  . ABDOMINAL  HYSTERECTOMY    . ANTERIOR CERVICAL DISCECTOMY  03/2017   C6-7; Dr. Christella Noa   Family History  Problem Relation Age of Onset  . Diabetes Mother   . Breast cancer Mother        had masectomy.   . Skin cancer Maternal Grandfather    Allergies as of 09/26/2019   No Known Allergies     Medication List       Accurate as of September 26, 2019 11:59 PM. If you have any questions, ask your nurse or doctor.        albuterol 108 (90 Base) MCG/ACT inhaler Commonly known as: VENTOLIN HFA Inhale 1-2 puffs into the lungs daily as needed for wheezing or shortness of breath. Started by: Howard Pouch, DO   azithromycin 250 MG tablet Commonly known as: ZITHROMAX 2 tabs day 1, then 1 tab QD Started by: Howard Pouch, DO   B-complex with vitamin C tablet Take 1 tablet by mouth daily.   baclofen 10 MG tablet Commonly known as: LIORESAL Take 5 mg by mouth 3 (three) times daily as needed.   cetirizine 10 MG tablet Commonly known as: ZYRTEC Take 1 tablet (10 mg total) by mouth daily. Started by: Howard Pouch, DO  DULoxetine 30 MG capsule Commonly known as: CYMBALTA TAKE 1 CAPSULE BY MOUTH EVERY DAY   gabapentin 300 MG capsule Commonly known as: NEURONTIN 1 capsule in the morning, 2 capsules at night   glycopyrrolate 1 MG tablet Commonly known as: Robinul Take 1 tablet (1 mg total) by mouth daily.   hydrOXYzine 25 MG tablet Commonly known as: ATARAX/VISTARIL 1-2 tabs PO at bedtime   levothyroxine 25 MCG tablet Commonly known as: SYNTHROID Take 1 tablet (25 mcg total) by mouth daily.   meloxicam 15 MG tablet Commonly known as: MOBIC TAKE 1 TABLET BY MOUTH IN THE MORNING WITH BREAKFAST FOR 2 WEEKS, THEN DAILY AS NEEDED FOR PAIN       All past medical history, surgical history, allergies, family history, immunizations andmedications were updated in the EMR today and reviewed under the history and medication portions of their EMR.     ROS: Negative, with the exception of above  mentioned in HPI   Objective:  BP 125/84 (BP Location: Right Arm, Patient Position: Sitting, Cuff Size: Normal)   Pulse 74   Temp 97.9 F (36.6 C) (Temporal)   Resp 18   Ht 5\' 3"  (1.6 m)   Wt 138 lb 4 oz (62.7 kg)   SpO2 100%   BMI 24.49 kg/m  Body mass index is 24.49 kg/m. Gen: Afebrile. No acute distress. Nontoxic in appearance, well developed, well nourished. pleasant female.  HENT: AT. [place]. Bilateral TM visualized w/o erythema or bulging. . MMM, no oral lesions. Bilateral nares with erythema, drainage and swelling. Throat without erythema or exudates. Cough present Eyes:Pupils Equal Round Reactive to light, Extraocular movements intact,  Conjunctiva without redness, discharge or icterus. Neck/lymp/endocrine: Supple,no lymphadenopathy CV: RRR  Chest: CTAB, no wheeze or crackles. Good air movement, normal resp effort.  Skin: no rashes, purpura or petechiae.  Neuro:  Normal gait. PERLA. EOMi. Alert. Oriented x3  Psych: Normal affect, dress and demeanor. Normal speech. Normal thought content and judgment.  No exam data present No results found. No results found for this or any previous visit (from the past 24 hour(s)).  Assessment/Plan: Laura Steele is a 44 y.o. female present for OV for  Hyperhidrosis of axilla Stable.  - glycopyrrolate (ROBINUL) 1 MG tablet; Take 1 tablet (1 mg total) by mouth daily.  Dispense: 90 tablet; Refill: 1  Bronchospasm, acute/Smoker Smoking cessation strongly encouraged.  Albuterol as needed with instructions on proper use. Referral to pulm> likely COPD, may benefit from PFT and daily inhaler tx.  Anxiety: Stable.  Continue cymbalta.    Bacterial sinusitis She also has signs of sinusitis today Rest, hydrate.  + flonase, mucinex (DM if cough), nettie pot or nasal saline.  Z-pack prescribed, take until completed.  If cough present it can last up to 6-8 weeks.  F/U 2 weeks of not improved.     Reviewed expectations re: course of  current medical issues.  Discussed self-management of symptoms.  Outlined signs and symptoms indicating need for more acute intervention.  Patient verbalized understanding and all questions were answered.  Patient received an After-Visit Summary.    No orders of the defined types were placed in this encounter.  Meds ordered this encounter  Medications  . DULoxetine (CYMBALTA) 30 MG capsule    Sig: TAKE 1 CAPSULE BY MOUTH EVERY DAY    Dispense:  90 capsule    Refill:  1  . glycopyrrolate (ROBINUL) 1 MG tablet    Sig: Take 1 tablet (1 mg total) by mouth  daily.    Dispense:  90 tablet    Refill:  1  . cetirizine (ZYRTEC) 10 MG tablet    Sig: Take 1 tablet (10 mg total) by mouth daily.    Dispense:  30 tablet    Refill:  11  . azithromycin (ZITHROMAX) 250 MG tablet    Sig: 2 tabs day 1, then 1 tab QD    Dispense:  6 tablet    Refill:  0  . albuterol (VENTOLIN HFA) 108 (90 Base) MCG/ACT inhaler    Sig: Inhale 1-2 puffs into the lungs daily as needed for wheezing or shortness of breath.    Dispense:  8 g    Refill:  5   Referral Orders  No referral(s) requested today     Note is dictated utilizing voice recognition software. Although note has been proof read prior to signing, occasional typographical errors still can be missed. If any questions arise, please do not hesitate to call for verification.   electronically signed by:  Howard Pouch, DO  [place]

## 2019-10-19 ENCOUNTER — Encounter: Payer: Self-pay | Admitting: Family Medicine

## 2019-10-28 DIAGNOSIS — M5030 Other cervical disc degeneration, unspecified cervical region: Secondary | ICD-10-CM

## 2019-10-29 NOTE — Assessment & Plan Note (Signed)
Recurrence of neck pain, last epidural was in June. Ordering repeat left C7-T1 interlaminar epidural.

## 2019-10-29 NOTE — Telephone Encounter (Signed)
Epidural ordered, please contact [facility] for scheduling. 

## 2019-10-30 NOTE — Telephone Encounter (Signed)
Epidural has been scheduled

## 2019-11-14 ENCOUNTER — Ambulatory Visit
Admission: RE | Admit: 2019-11-14 | Discharge: 2019-11-14 | Disposition: A | Discharge status: Home / Self Care | Payer: 59 | Source: Ambulatory Visit | Attending: Sports Medicine | Admitting: Sports Medicine

## 2019-11-14 ENCOUNTER — Other Ambulatory Visit: Payer: Self-pay

## 2019-11-14 DIAGNOSIS — M5030 Other cervical disc degeneration, unspecified cervical region: Secondary | ICD-10-CM

## 2019-11-14 MED ORDER — TRIAMCINOLONE ACETONIDE 40 MG/ML IJ SUSP (RADIOLOGY)
60.0000 mg | Freq: Once | INTRAMUSCULAR | Status: AC
  Administered 2019-11-14: 60 mg via EPIDURAL

## 2019-11-14 MED ORDER — IOPAMIDOL (ISOVUE-M 300) INJECTION 61%
1.0000 mL | Freq: Once | INTRAMUSCULAR | Status: AC
  Administered 2019-11-14: 1 mL via EPIDURAL

## 2019-11-14 NOTE — Discharge Instructions (Signed)

## 2019-11-24 ENCOUNTER — Encounter: Payer: Self-pay | Admitting: Family Medicine

## 2019-11-24 ENCOUNTER — Emergency Department (HOSPITAL_COMMUNITY)
Admission: EM | Admit: 2019-11-24 | Discharge: 2019-11-24 | Disposition: A | Discharge status: Home / Self Care | Payer: 59 | Attending: Emergency Medicine | Admitting: Emergency Medicine

## 2019-11-24 ENCOUNTER — Other Ambulatory Visit: Payer: Self-pay

## 2019-11-24 ENCOUNTER — Encounter (HOSPITAL_COMMUNITY): Payer: Self-pay | Admitting: *Deleted

## 2019-11-24 ENCOUNTER — Emergency Department (HOSPITAL_COMMUNITY): Payer: 59

## 2019-11-24 DIAGNOSIS — F419 Anxiety disorder, unspecified: Secondary | ICD-10-CM | POA: Diagnosis not present

## 2019-11-24 DIAGNOSIS — Z20822 Contact with and (suspected) exposure to covid-19: Secondary | ICD-10-CM | POA: Insufficient documentation

## 2019-11-24 DIAGNOSIS — R0789 Other chest pain: Secondary | ICD-10-CM

## 2019-11-24 DIAGNOSIS — F17210 Nicotine dependence, cigarettes, uncomplicated: Secondary | ICD-10-CM | POA: Diagnosis not present

## 2019-11-24 LAB — CBC WITH DIFFERENTIAL/PLATELET
Abs Immature Granulocytes: 0.04 K/uL (ref 0.00–0.07)
Basophils Absolute: 0.1 K/uL (ref 0.0–0.1)
Basophils Relative: 1 %
Eosinophils Absolute: 0.2 K/uL (ref 0.0–0.5)
Eosinophils Relative: 2 %
HCT: 48.7 % — ABNORMAL HIGH (ref 36.0–46.0)
Hemoglobin: 16.4 g/dL — ABNORMAL HIGH (ref 12.0–15.0)
Immature Granulocytes: 1 %
Lymphocytes Relative: 36 %
Lymphs Abs: 2.9 K/uL (ref 0.7–4.0)
MCH: 33.9 pg (ref 26.0–34.0)
MCHC: 33.7 g/dL (ref 30.0–36.0)
MCV: 100.6 fL — ABNORMAL HIGH (ref 80.0–100.0)
Monocytes Absolute: 0.8 K/uL (ref 0.1–1.0)
Monocytes Relative: 10 %
Neutro Abs: 4 K/uL (ref 1.7–7.7)
Neutrophils Relative %: 50 %
Platelets: 295 K/uL (ref 150–400)
RBC: 4.84 MIL/uL (ref 3.87–5.11)
RDW: 12.4 % (ref 11.5–15.5)
WBC: 8 K/uL (ref 4.0–10.5)
nRBC: 0 % (ref 0.0–0.2)

## 2019-11-24 LAB — BASIC METABOLIC PANEL WITH GFR
Anion gap: 13 (ref 5–15)
BUN: 7 mg/dL (ref 6–20)
CO2: 21 mmol/L — ABNORMAL LOW (ref 22–32)
Calcium: 8.2 mg/dL — ABNORMAL LOW (ref 8.9–10.3)
Chloride: 106 mmol/L (ref 98–111)
Creatinine, Ser: 0.48 mg/dL (ref 0.44–1.00)
GFR calc Af Amer: >60 mL/min
GFR calc non Af Amer: >60 mL/min
Glucose, Bld: 114 mg/dL — ABNORMAL HIGH (ref 70–99)
Potassium: 3.6 mmol/L (ref 3.5–5.1)
Sodium: 140 mmol/L (ref 135–145)

## 2019-11-24 LAB — TROPONIN I (HIGH SENSITIVITY): Troponin I (High Sensitivity): <2 ng/L

## 2019-11-24 LAB — SARS CORONAVIRUS 2 BY RT PCR (HOSPITAL ORDER, PERFORMED IN ~~LOC~~ HOSPITAL LAB): SARS Coronavirus 2: NEGATIVE

## 2019-11-24 NOTE — Discharge Instructions (Addendum)
Your work-up today was reassuring.  Your Covid test was negative.  Continue taking your medication as directed.  Follow-up with your primary care provider this week for recheck.  I have also listed the follow-up information for cardiology.  Return to the emergency department for any worsening symptoms.

## 2019-11-24 NOTE — ED Triage Notes (Signed)
Arrived via EMS, hyperventilating on arrival. States she take medication for anxiety

## 2019-11-24 NOTE — ED Provider Notes (Signed)
[facility] EMERGENCY DEPARTMENT Provider Note   CSN: [id number] Arrival date & time: 11/24/19  1119     History Chief Complaint  Patient presents with  . Panic Attack    Laura Steele is a 44 y.o. female.  HPI      Laura Steele is a 44 y.o. female with past medical history of anxiety and hypothyroidism, who presents to the Emergency Department complaining of upper chest tightness and pressure, anxiety in generalized fatigue.  Symptoms have been present for several days.  She reports multiple episodes of increased anxiety and feelings of anxiousness.  She states she is been taking Cymbalta for anxiety, but has not really controlled her symptoms.  She denies increased stressors.  She describes dull pressure throughout her upper chest that waxes and wanes in severity but does not resolve.  No cough or shortness of breath.  No recent illness or known Covid exposures.  She states she has had both of her Covid vaccines.  She denies fever, chills, feelings of depression, suicidal or homicidal thoughts.  Per EMS report, patient was hyperventilating on arrival.  She now states that she feels her anxiety has improved but she continues to have chest pressure.    Past Medical History:  Diagnosis Date  . Allergy   . Anxiety   . Hypoglycemia     Patient Active Problem List   Diagnosis Date Noted  . Acquired hypothyroidism 03/25/2019  . Thoracic outlet syndrome of left thoracic outlet 02/10/2019  . Carpal tunnel syndrome, bilateral 01/17/2018  . DDD (degenerative disc disease), cervical 01/17/2018  . Left shoulder pain 01/17/2018  . B12 deficiency 12/26/2017  . Folate deficiency 12/26/2017  . Elevated hemoglobin ([place]) 10/24/2017  . Elevated glucose 10/24/2017  . Insomnia 06/22/2017  . Abnormal mammogram of right breast 01/26/2017  . Macrocytosis without anemia 12/05/2016  . 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 Anxiety 06/09/2016    Past Surgical History:  Procedure  Laterality Date  . ABDOMINAL HYSTERECTOMY    . ANTERIOR CERVICAL DISCECTOMY  03/2017   C6-7; Dr. Christella Noa     OB History   No obstetric history on file.     Family History  Problem Relation Age of Onset  . Diabetes Mother   . Breast cancer Mother        had masectomy.   . Skin cancer Maternal Grandfather     Social History   Tobacco Use  . Smoking status: Current Every Day Smoker    Packs/day: 1.00    Types: Cigarettes  . Smokeless tobacco: Never Used  Vaping Use  . Vaping Use: Never used  Substance Use Topics  . Alcohol use: Yes    Comment: Pt states was drinkning 40 0z of beer a night. but stopped since sunday.  . Drug use: No    Home Medications Prior to Admission medications   Medication Sig Start Date End Date Taking? Authorizing Provider  albuterol (VENTOLIN HFA) 108 (90 Base) MCG/ACT inhaler Inhale 1-2 puffs into the lungs daily as needed for wheezing or shortness of breath. 09/26/19   Kuneff, Renee A, DO  azithromycin (ZITHROMAX) 250 MG tablet 2 tabs day 1, then 1 tab QD 09/26/19   Kuneff, Renee A, DO  B Complex-C (B-COMPLEX WITH VITAMIN C) tablet Take 1 tablet by mouth daily.    [provider]  baclofen (LIORESAL) 10 MG tablet Take 5 mg by mouth 3 (three) times daily as needed. 02/21/19   [provider]  cetirizine (ZYRTEC) 10 MG  tablet Take 1 tablet (10 mg total) by mouth daily. 09/26/19   Kuneff, Renee A, DO  DULoxetine (CYMBALTA) 30 MG capsule TAKE 1 CAPSULE BY MOUTH EVERY DAY 09/26/19   Kuneff, Renee A, DO  gabapentin (NEURONTIN) 300 MG capsule 1 capsule in the morning, 2 capsules at night 02/10/19   Silverio Decamp, MD  glycopyrrolate (ROBINUL) 1 MG tablet Take 1 tablet (1 mg total) by mouth daily. 09/26/19   Kuneff, Renee A, DO  hydrOXYzine (ATARAX/VISTARIL) 25 MG tablet 1-2 tabs PO at bedtime 03/25/19   Kuneff, Renee A, DO  levothyroxine (SYNTHROID) 25 MCG tablet Take 1 tablet (25 mcg total) by mouth daily. 03/25/19   Kuneff, Renee A, DO    meloxicam (MOBIC) 15 MG tablet TAKE 1 TABLET BY MOUTH IN THE MORNING WITH BREAKFAST FOR 2 WEEKS, THEN DAILY AS NEEDED FOR PAIN 08/15/19   Silverio Decamp, MD    Allergies    Patient has no known allergies.  Review of Systems   Review of Systems  Constitutional: Positive for fatigue. Negative for chills and fever.  HENT: Negative for sore throat and trouble swallowing.   Respiratory: Positive for chest tightness. Negative for cough, shortness of breath and wheezing.   Cardiovascular: Positive for chest pain. Negative for palpitations.  Gastrointestinal: Negative for abdominal pain, diarrhea, nausea and vomiting.  Musculoskeletal: Negative for arthralgias, back pain, myalgias, neck pain and neck stiffness.  Skin: Negative for rash.  Neurological: Negative for dizziness, syncope, weakness, numbness and headaches.  Hematological: Does not bruise/bleed easily.  Psychiatric/Behavioral: Negative for confusion, dysphoric mood and suicidal ideas. The patient is nervous/anxious.     Physical Exam Updated Vital Signs BP 130/82   Pulse 74   Temp 98.4 F (36.9 C)   Resp 20   SpO2 97%   Physical Exam Vitals and nursing note reviewed.  Constitutional:      Appearance: Normal appearance. She is not ill-appearing or toxic-appearing.  HENT:     Mouth/Throat:     Mouth: Mucous membranes are moist.  Cardiovascular:     Rate and Rhythm: Normal rate and regular rhythm.     Pulses: Normal pulses.  Pulmonary:     Effort: Pulmonary effort is normal. No respiratory distress.     Breath sounds: Normal breath sounds.  Chest:     Chest wall: No tenderness.  Musculoskeletal:        General: Normal range of motion.     Cervical back: Normal range of motion. No tenderness.  Lymphadenopathy:     Cervical: No cervical adenopathy.  Skin:    General: Skin is warm.     Capillary Refill: Capillary refill takes less than 2 seconds.     Findings: No rash.  Neurological:     General: No focal  deficit present.     Mental Status: She is alert.     Sensory: No sensory deficit.     Motor: No weakness.  Psychiatric:        Attention and Perception: Attention normal.        Mood and Affect: Mood normal.        Speech: Speech normal.        Behavior: Behavior normal. Behavior is cooperative.        Thought Content: Thought content is not paranoid. Thought content does not include homicidal or suicidal ideation.     ED Results / Procedures / Treatments   Labs (all labs ordered are listed, but only abnormal results are displayed)  Labs Reviewed  BASIC METABOLIC PANEL - Abnormal; Notable for the following components:      Result Value   CO2 21 (*)    Glucose, Bld 114 (*)    Calcium 8.2 (*)    All other components within normal limits  CBC WITH DIFFERENTIAL/PLATELET - Abnormal; Notable for the following components:   Hemoglobin 16.4 (*)    HCT 48.7 (*)    MCV 100.6 (*)    All other components within normal limits  SARS CORONAVIRUS 2 BY RT PCR [facility] ORDER, [place] [facility])  TROPONIN I (HIGH SENSITIVITY)    EKG EKG Interpretation  Date/Time:  Monday November 24 2019 16:31:17 EDT Ventricular Rate:  100 PR Interval:  134 QRS Duration: 72 QT Interval:  350 QTC Calculation: 451 R Axis:   82 Text Interpretation: Normal sinus rhythm Normal ECG Confirmed by Milton Ferguson [phone]) on 11/24/2019 4:48:10 PM   Radiology DG Chest Portable 1 View  Result Date: 11/24/2019 CLINICAL DATA:  Chest pain. EXAM: PORTABLE CHEST 1 VIEW COMPARISON:  Aug 01, 2015 FINDINGS: There is no evidence of acute infiltrate, pleural effusion or pneumothorax. The heart size and mediastinal contours are within normal limits. Radiopaque operative material is seen overlying the lower cervical spine. This represents a new finding when compared to the prior exam. The visualized skeletal structures are otherwise unremarkable. IMPRESSION: No active disease. Electronically Signed   By:  Virgina Norfolk M.D.   On: 11/24/2019 15:07     Procedures Procedures (including critical care time)  Medications Ordered in ED Medications - No data to display  ED Course  I have reviewed the triage vital signs and the nursing notes.  Pertinent labs & imaging results that were available during my care of the patient were reviewed by me and considered in my medical decision making (see chart for details).    MDM Rules/Calculators/A&P                          Patient here with known history of anxiety currently taking Cymbalta.  Reports increased episodes of panic attacks along with chest tightness and pressure that waxes and wanes in severity.  No history of cardiac disease or PE.  No fever or cough and she has been fully vaccinated for COVID-19.  On my exam, she is well-appearing.  Vital signs reassuring.  No concern for PE.  Chest pain is not pleuritic.  She does not appear anxious at this time.  Reported that she was hyperventilating upon arrival, her breathing on my exam is nonlabored.  Workup here is reassuring.  She no longer appears anxious and reports feeling better.  I feel that she appropriate for d/c home.  Return precautions discussed   Final Clinical Impression(s) / ED Diagnoses Final diagnoses:  Anxiety  Atypical chest pain    Rx / DC Orders ED Discharge Orders    None       Kem Parkinson, PA-C 11/28/19 1435    Long, Wonda Olds, MD 12/01/19 1226

## 2019-11-25 ENCOUNTER — Ambulatory Visit: Payer: 59 | Admitting: Family Medicine

## 2019-11-28 ENCOUNTER — Other Ambulatory Visit: Payer: Self-pay

## 2019-11-28 ENCOUNTER — Ambulatory Visit (INDEPENDENT_AMBULATORY_CARE_PROVIDER_SITE_OTHER): Payer: 59 | Admitting: Family Medicine

## 2019-11-28 ENCOUNTER — Encounter: Payer: Self-pay | Admitting: Family Medicine

## 2019-11-28 VITALS — BP 149/87 | HR 85 | Temp 98.8°F | Resp 16 | Wt 142.6 lb

## 2019-11-28 DIAGNOSIS — E039 Hypothyroidism, unspecified: Secondary | ICD-10-CM | POA: Diagnosis not present

## 2019-11-28 DIAGNOSIS — F17210 Nicotine dependence, cigarettes, uncomplicated: Secondary | ICD-10-CM | POA: Diagnosis not present

## 2019-11-28 DIAGNOSIS — L74510 Primary focal hyperhidrosis, axilla: Secondary | ICD-10-CM | POA: Diagnosis not present

## 2019-11-28 DIAGNOSIS — F17200 Nicotine dependence, unspecified, uncomplicated: Secondary | ICD-10-CM

## 2019-11-28 DIAGNOSIS — Z716 Tobacco abuse counseling: Secondary | ICD-10-CM

## 2019-11-28 DIAGNOSIS — G4700 Insomnia, unspecified: Secondary | ICD-10-CM | POA: Diagnosis not present

## 2019-11-28 DIAGNOSIS — F419 Anxiety disorder, unspecified: Secondary | ICD-10-CM

## 2019-11-28 DIAGNOSIS — Z23 Encounter for immunization: Secondary | ICD-10-CM

## 2019-11-28 LAB — T3, FREE: T3, Free: 2.6 pg/mL (ref 2.3–4.2)

## 2019-11-28 LAB — T4, FREE: Free T4: 0.66 ng/dL (ref 0.60–1.60)

## 2019-11-28 MED ORDER — NICOTINE POLACRILEX 4 MG MT GUM: 4.0000 mg | CHEWING_GUM | OROMUCOSAL | 2 refills | Status: DC | PRN

## 2019-11-28 MED ORDER — BUPROPION HCL ER (SR) 150 MG PO TB12
150.0000 mg | ORAL_TABLET | Freq: Two times a day (BID) | ORAL | 5 refills | Status: DC
Start: 2019-11-28 — End: 2020-02-16

## 2019-11-28 MED ORDER — NICOTINE 7 MG/24HR TD PT24: 7.0000 mg | MEDICATED_PATCH | Freq: Every day | TRANSDERMAL | 0 refills | Status: DC

## 2019-11-28 MED ORDER — GLYCOPYRROLATE 1 MG PO TABS: 1.0000 mg | ORAL_TABLET | Freq: Every day | ORAL | 1 refills | Status: DC

## 2019-11-28 MED ORDER — NICOTINE 14 MG/24HR TD PT24: 14.0000 mg | MEDICATED_PATCH | Freq: Every day | TRANSDERMAL | 0 refills | Status: DC

## 2019-11-28 MED ORDER — DULOXETINE HCL 30 MG PO CPEP: ORAL_CAPSULE | ORAL | 1 refills | Status: DC

## 2019-11-28 NOTE — Patient Instructions (Addendum)
Start Wellbutrin 1 tab for 3 days, then increase to 1 tab every 12 hours. After 10-14 days on med>stop smoking by placing a patch (14 mg) on skin the night prior to quit date. 14 mg patch- change out daily for 14 days then decrease to 7 mg patch for 14 days.   Plan to be on Wellbutrin 3 months at least- may decide to use longer if also working well for anxiety> you have refills.      Coping with Quitting Smoking  Quitting smoking is a physical and mental challenge. You will face cravings, withdrawal symptoms, and temptation. Before quitting, work with your health care provider to make a plan that can help you cope. Preparation can help you quit and keep you from giving in. How can I cope with cravings? Cravings usually last for 5-10 minutes. If you get through it, the craving will pass. Consider taking the following actions to help you cope with cravings:  Keep your mouth busy: ? Chew sugar-free gum. ? Suck on hard candies or a straw. ? Brush your teeth.  Keep your hands and body busy: ? Immediately change to a different activity when you feel a craving. ? Squeeze or play with a ball. ? Do an activity or a hobby, like making bead jewelry, practicing needlepoint, or working with wood. ? Mix up your normal routine. ? Take a short exercise break. Go for a quick walk or run up and down stairs. ? Spend time in public places where smoking is not allowed.  Focus on doing something kind or helpful for someone else.  Call a friend or family member to talk during a craving.  Join a support group.  Call a quit line, such as 1-800-QUIT-NOW.  Talk with your health care provider about medicines that might help you cope with cravings and make quitting easier for you. How can I deal with withdrawal symptoms? Your body may experience negative effects as it tries to get used to not having nicotine in the system. These effects are called withdrawal symptoms. They may include:  Feeling hungrier  than normal.  Trouble concentrating.  Irritability.  Trouble sleeping.  Feeling depressed.  Restlessness and agitation.  Craving a cigarette. To manage withdrawal symptoms:  Avoid places, people, and activities that trigger your cravings.  Remember why you want to quit.  Get plenty of sleep.  Avoid coffee and other caffeinated drinks. These may worsen some of your symptoms. How can I handle social situations? Social situations can be difficult when you are quitting smoking, especially in the first few weeks. To manage this, you can:  Avoid parties, bars, and other social situations where people might be smoking.  Avoid alcohol.  Leave right away if you have the urge to smoke.  Explain to your family and friends that you are quitting smoking. Ask for understanding and support.  Plan activities with friends or family where smoking is not an option. What are some ways I can cope with stress? Wanting to smoke may cause stress, and stress can make you want to smoke. Find ways to manage your stress. Relaxation techniques can help. For example:  Breathe slowly and deeply, in through your nose and out through your mouth.  Listen to soothing, relaxing music.  Talk with a family member or friend about your stress.  Light a candle.  Soak in a bath or take a shower.  Think about a peaceful place. What are some ways I can prevent weight gain? Be aware  that many people gain weight after they quit smoking. However, not everyone does. To keep from gaining weight, have a plan in place before you quit and stick to the plan after you quit. Your plan should include:  Having healthy snacks. When you have a craving, it may help to: ? Eat plain popcorn, crunchy carrots, celery, or other cut vegetables. ? Chew sugar-free gum.  Changing how you eat: ? Eat small portion sizes at meals. ? Eat 4-6 small meals throughout the day instead of 1-2 large meals a day. ? Be mindful when you  eat. Do not watch television or do other things that might distract you as you eat.  Exercising regularly: ? Make time to exercise each day. If you do not have time for a long workout, do short bouts of exercise for 5-10 minutes several times a day. ? Do some form of strengthening exercise, like weight lifting, and some form of aerobic exercise, like running or swimming.  Drinking plenty of water or other low-calorie or no-calorie drinks. Drink 6-8 glasses of water daily, or as much as instructed by your health care provider. Summary  Quitting smoking is a physical and mental challenge. You will face cravings, withdrawal symptoms, and temptation to smoke again. Preparation can help you as you go through these challenges.  You can cope with cravings by keeping your mouth busy (such as by chewing gum), keeping your body and hands busy, and making calls to family, friends, or a helpline for people who want to quit smoking.  You can cope with withdrawal symptoms by avoiding places where people smoke, avoiding drinks with caffeine, and getting plenty of rest.  Ask your health care provider about the different ways to prevent weight gain, avoid stress, and handle social situations. This information is not intended to replace advice given to you by your health care provider. Make sure you discuss any questions you have with your health care provider. Document Revised: 02/23/2017 Document Reviewed: 03/10/2016 Elsevier Patient Education  2020 [employer].

## 2019-11-28 NOTE — Progress Notes (Signed)
Would like to talk about quitting smoking.

## 2019-11-28 NOTE — Progress Notes (Signed)
Laura Steele , 1975/06/07, 44 y.o., female MRN: [id number] Patient Care Team    Relationship Specialty Notifications Start End  Ma Hillock, DO PCP - General Family Medicine  10/22/15   Ashok Pall, MD Consulting Physician Neurosurgery  06/22/17   Silverio Decamp, MD Consulting Physician Sports Medicine  06/22/17     Chief Complaint  Patient presents with   Anxiety   Nicotine Dependence     Subjective: Laura Steele is a 44 y.o. female present for [facility] Hyperhidrosis of axilla She reports her condition is stable with use of Robinul daily.  Hypothyroidism:  Last collection 04/01/2018- TSH WNL.  Patient reports compliance with levothyroxine 25 mcg daily she is due for labs and refills today  Anxiety/Insomnia:  Doing well on Cymbalta- well controlled. Not sleeping well, but reports she has not been receiving her vistaril from her pharmacy.    Depression screen [facility] 2/9 10/24/2017 06/22/2017 12/18/2016 12/04/2016 06/09/2016  Decreased Interest 0 1 0 - 1  Down, Depressed, Hopeless 0 0 0 0 0  PHQ - 2 Score 0 1 0 0 1  Altered sleeping - 2 - 0 1  Tired, decreased energy - 3 - 0 2  Change in appetite - 2 - 0 0  Feeling bad or failure about yourself  - 0 - 0 0  Trouble concentrating - 0 - 0 0  Moving slowly or fidgety/restless - 0 - 0 1  Suicidal thoughts - 0 - 0 0  PHQ-9 Score - 8 - 0 5  Difficult doing work/chores - Somewhat difficult - - -   GAD 7 : Generalized Anxiety Score 09/26/2019 03/25/2019 10/03/2018 10/24/2017  Nervous, Anxious, on Edge 1 0 0 0  Control/stop worrying 1 0 1 0  Worry too much - different things 0 0 1 0  Trouble relaxing 0 3 1 0  Restless 0 0 1 0  Easily annoyed or irritable 0 0 0 0  Afraid - awful might happen 0 0 0 0  Total GAD 7 Score 2 3 4  0  Anxiety Difficulty Not difficult at all Not difficult at all Not difficult at all -      No Known Allergies Social History   Tobacco Use   Smoking status: Current Every Day Smoker     Packs/day: 1.00    Types: Cigarettes   Smokeless tobacco: Never Used  Substance Use Topics   Alcohol use: Yes    Comment: Pt states was drinkning 40 0z of beer a night. but stopped since sunday.   Past Medical History:  Diagnosis Date   Allergy    Anxiety    Hypoglycemia    Past Surgical History:  Procedure Laterality Date   ABDOMINAL HYSTERECTOMY     ANTERIOR CERVICAL DISCECTOMY  03/2017   C6-7; Dr. Christella Noa   Family History  Problem Relation Age of Onset   Diabetes Mother    Breast cancer Mother        had masectomy.    Skin cancer Maternal Grandfather    Allergies as of 11/28/2019   No Known Allergies     Medication List       Accurate as of November 28, 2019  8:56 AM. If you have any questions, ask your nurse or doctor.        STOP taking these medications   azithromycin 250 MG tablet Commonly known as: ZITHROMAX Stopped by: Howard Pouch, DO     TAKE these medications   albuterol 108 (90 Base)  MCG/ACT inhaler Commonly known as: VENTOLIN HFA Inhale 1-2 puffs into the lungs daily as needed for wheezing or shortness of breath.   B-complex with vitamin C tablet Take 1 tablet by mouth daily.   baclofen 10 MG tablet Commonly known as: LIORESAL Take 5 mg by mouth 3 (three) times daily as needed.   buPROPion 150 MG 12 hr tablet Commonly known as: Wellbutrin SR Take 1 tablet (150 mg total) by mouth 2 (two) times daily. Started by: Howard Pouch, DO   cetirizine 10 MG tablet Commonly known as: ZYRTEC Take 1 tablet (10 mg total) by mouth daily.   DULoxetine 30 MG capsule Commonly known as: CYMBALTA TAKE 1 CAPSULE BY MOUTH EVERY DAY   gabapentin 300 MG capsule Commonly known as: NEURONTIN 1 capsule in the morning, 2 capsules at night   glycopyrrolate 1 MG tablet Commonly known as: Robinul Take 1 tablet (1 mg total) by mouth daily.   hydrOXYzine 25 MG tablet Commonly known as: ATARAX/VISTARIL 1-2 tabs PO at bedtime   levothyroxine 25 MCG  tablet Commonly known as: SYNTHROID Take 1 tablet (25 mcg total) by mouth daily.   meloxicam 15 MG tablet Commonly known as: MOBIC TAKE 1 TABLET BY MOUTH IN THE MORNING WITH BREAKFAST FOR 2 WEEKS, THEN DAILY AS NEEDED FOR PAIN   nicotine 14 mg/24hr patch Commonly known as: NICODERM CQ - dosed in mg/24 hours Place 1 patch (14 mg total) onto the skin daily. Started by: Howard Pouch, DO   nicotine 7 mg/24hr patch Commonly known as: NICODERM CQ - dosed in mg/24 hr Place 1 patch (7 mg total) onto the skin daily. Started by: Howard Pouch, DO   nicotine polacrilex 4 MG gum Commonly known as: [facility] Nicotine Take 1 each (4 mg total) by mouth as needed for smoking cessation. Started by: Howard Pouch, DO       All past medical history, surgical history, allergies, family history, immunizations andmedications were updated in the EMR today and reviewed under the history and medication portions of their EMR.     ROS: Negative, with the exception of above mentioned in HPI   Objective:  BP (!) 149/87 (BP Location: Right Arm, Patient Position: Sitting, Cuff Size: Normal)    Pulse 85    Temp 98.8 F (37.1 C) (Oral)    Resp 16    Wt 142 lb 9.6 oz (64.7 kg)    SpO2 99%    BMI 25.26 kg/m  Body mass index is 25.26 kg/m. Gen: Afebrile. No acute distress. Nontoxic. Pleasant female.  HENT: AT. [place].  Eyes:Pupils Equal Round Reactive to light, Extraocular movements intact,  Conjunctiva without redness, discharge or icterus. Neck/lymp/endocrine: Supple,no lymphadenopathy, no thyromegaly, anterior neck scar well healed.  CV: RRR no murmur, no edema, +2/4 P posterior tibialis pulses Chest: CTAB, no wheeze or crackles Neuro:  Normal gait. PERLA. EOMi. Alert. Oriented x3  Psych: Normal affect, dress and demeanor. Normal speech. Normal thought content and judgment..    No exam data present No results found. No results found for this or any previous visit (from the past 24  hour(s)).  Assessment/Plan: [name] is a 44 y.o. female present for OV for  Hypothyroid/hypocalcemia -Continue levothyroxine 25 mcg daily.   Will retest labs today to ensure not cause of increase in panic.  Pth/ca/vit d collected today > she did have anterior neck/surgery 2019  Anxiety/insomnia:  Increased panic last month.  Continue Cymbalta Added Wellbutrin to smoking cessation and ruling out endocrine cause. If  panic not better controlled and labs normal consider BB therapy PRN vs buspar.  F/u 6 mos.   Hyperhidrosis of axilla Stable Continue Robinul  Smoking cessation/tobacco abuse:  Discussed smoking cessation in detail with her today.   willingness to quit smoking was assessed and felt to be in  preparation phase. They were offered assistance in cessation and options were explained to them today.  Patient decided to try wellbutrin, patches and nicotine gum.  11 minutes were spent today in counseling.  Pt advised to follow up 3 mos.   Influenza vaccine administered today.    Reviewed expectations re: course of current medical issues.  Discussed self-management of symptoms.  Outlined signs and symptoms indicating need for more acute intervention.  Patient verbalized understanding and all questions were answered.  Patient received an After-Visit Summary.    Orders Placed This Encounter  Procedures   Flu Vaccine QUAD 36+ mos IM   TSH   PTH, Intact and Calcium   T4, free   T3, free   Vitamin D (25 hydroxy)   Meds ordered this encounter  Medications   buPROPion (WELLBUTRIN SR) 150 MG 12 hr tablet    Sig: Take 1 tablet (150 mg total) by mouth 2 (two) times daily.    Dispense:  60 tablet    Refill:  5   nicotine (NICODERM CQ - DOSED IN MG/24 HOURS) 14 mg/24hr patch    Sig: Place 1 patch (14 mg total) onto the skin daily.    Dispense:  14 patch    Refill:  0    Generic substitute   nicotine (NICODERM CQ - DOSED IN MG/24 HR) 7 mg/24hr patch     Sig: Place 1 patch (7 mg total) onto the skin daily.    Dispense:  14 patch    Refill:  0    Generic substitute   nicotine polacrilex ([facility] NICOTINE) 4 MG gum    Sig: Take 1 each (4 mg total) by mouth as needed for smoking cessation.    Dispense:  100 tablet    Refill:  2   DULoxetine (CYMBALTA) 30 MG capsule    Sig: TAKE 1 CAPSULE BY MOUTH EVERY DAY    Dispense:  90 capsule    Refill:  1   glycopyrrolate (ROBINUL) 1 MG tablet    Sig: Take 1 tablet (1 mg total) by mouth daily.    Dispense:  90 tablet    Refill:  1      Note is dictated utilizing voice recognition software. Although note has been proof read prior to signing, occasional typographical errors still can be missed. If any questions arise, please do not hesitate to call for verification.   electronically signed by:  Howard Pouch, DO  [place]

## 2019-12-02 LAB — VITAMIN D 25 HYDROXY (VIT D DEFICIENCY, FRACTURES): Vit D, 25-Hydroxy: 13 ng/mL — ABNORMAL LOW (ref 30–100)

## 2019-12-02 LAB — PTH, INTACT AND CALCIUM
Calcium: 8.9 mg/dL (ref 8.6–10.2)
PTH: 42 pg/mL (ref 14–64)

## 2019-12-02 LAB — TSH: TSH: 2 mIU/L

## 2019-12-02 NOTE — Progress Notes (Signed)
Left detailed VM per DPR

## 2019-12-22 ENCOUNTER — Other Ambulatory Visit: Payer: Self-pay | Admitting: Family Medicine

## 2020-01-15 ENCOUNTER — Other Ambulatory Visit: Payer: Self-pay | Admitting: Family Medicine

## 2020-01-20 ENCOUNTER — Other Ambulatory Visit: Payer: Self-pay | Admitting: Family Medicine

## 2020-01-22 ENCOUNTER — Other Ambulatory Visit: Payer: Self-pay | Admitting: Family Medicine

## 2020-01-23 ENCOUNTER — Ambulatory Visit (INDEPENDENT_AMBULATORY_CARE_PROVIDER_SITE_OTHER): Payer: 59 | Admitting: Sports Medicine

## 2020-01-23 ENCOUNTER — Ambulatory Visit (INDEPENDENT_AMBULATORY_CARE_PROVIDER_SITE_OTHER): Payer: 59

## 2020-01-23 ENCOUNTER — Other Ambulatory Visit: Payer: Self-pay

## 2020-01-23 DIAGNOSIS — M50322 Other cervical disc degeneration at C5-C6 level: Secondary | ICD-10-CM

## 2020-01-23 DIAGNOSIS — M5030 Other cervical disc degeneration, unspecified cervical region: Secondary | ICD-10-CM

## 2020-01-23 MED ORDER — DULOXETINE HCL 60 MG PO CPEP
60.0000 mg | ORAL_CAPSULE | Freq: Every day | ORAL | 3 refills | Status: DC
Start: 2020-01-23 — End: 2020-02-16

## 2020-01-23 MED ORDER — TRAMADOL HCL 50 MG PO TABS: 50.0000 mg | ORAL_TABLET | Freq: Three times a day (TID) | ORAL | 0 refills | Status: DC | PRN

## 2020-01-23 NOTE — Assessment & Plan Note (Signed)
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 Christella Noa. She continued to have pain, and has had a couple of cervical epidurals recently. She is also on gabapentin at about 600 mg at night, higher doses resulted in sedation. Unfortunately she is really not noticing any improvements. At this point we are going to proceed with cervical spine flexion/extension x-rays, updated cervical spine MRI with IV contrast. Increasing Cymbalta to 60 mg daily, tramadol for pain and a second opinion from [place] [facility] again.

## 2020-01-23 NOTE — Progress Notes (Signed)
    Procedures performed today:    None.  Independent interpretation of notes and tests performed by another provider:   None.  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 Christella Noa. She continued to have pain, and has had a couple of cervical epidurals recently. She is also on gabapentin at about 600 mg at night, higher doses resulted in sedation. Unfortunately she is really not noticing any improvements. At this point we are going to proceed with cervical spine flexion/extension x-rays, updated cervical spine MRI with IV contrast. Increasing Cymbalta to 60 mg daily, tramadol for pain and a second opinion from [place] [facility] again.    ___________________________________________ Gwen Her. Dianah Field, M.D., ABFM., CAQSM. Primary Care and [place] Instructor of [employer] [facility] [employer]

## 2020-01-29 DIAGNOSIS — M5030 Other cervical disc degeneration, unspecified cervical region: Secondary | ICD-10-CM

## 2020-01-30 MED ORDER — TRAMADOL-ACETAMINOPHEN 37.5-325 MG PO TABS
1.0000 | ORAL_TABLET | Freq: Three times a day (TID) | ORAL | 0 refills | Status: DC | PRN
Start: 2020-01-30 — End: 2020-04-06

## 2020-01-30 MED ORDER — TRIAZOLAM 0.25 MG PO TABS: ORAL_TABLET | ORAL | 0 refills | Status: DC

## 2020-01-30 NOTE — Addendum Note (Signed)
Addended by: Silverio Decamp on: 01/30/2020 05:16 PM   Modules accepted: Orders

## 2020-02-09 ENCOUNTER — Other Ambulatory Visit: Payer: Self-pay

## 2020-02-09 ENCOUNTER — Ambulatory Visit (INDEPENDENT_AMBULATORY_CARE_PROVIDER_SITE_OTHER): Payer: 59

## 2020-02-09 DIAGNOSIS — M4802 Spinal stenosis, cervical region: Secondary | ICD-10-CM

## 2020-02-09 DIAGNOSIS — M47812 Spondylosis without myelopathy or radiculopathy, cervical region: Secondary | ICD-10-CM | POA: Diagnosis not present

## 2020-02-09 DIAGNOSIS — M5030 Other cervical disc degeneration, unspecified cervical region: Secondary | ICD-10-CM

## 2020-02-09 MED ORDER — GADOBUTROL 1 MMOL/ML IV SOLN
6.0000 mL | Freq: Once | INTRAVENOUS | Status: AC | PRN
  Administered 2020-02-09: 6 mL via INTRAVENOUS

## 2020-02-14 ENCOUNTER — Other Ambulatory Visit: Payer: Self-pay | Admitting: Family Medicine

## 2020-02-14 ENCOUNTER — Other Ambulatory Visit: Payer: Self-pay | Admitting: Sports Medicine

## 2020-02-23 ENCOUNTER — Ambulatory Visit: Payer: 59 | Admitting: Sports Medicine

## 2020-03-08 DIAGNOSIS — G540 Brachial plexus disorders: Secondary | ICD-10-CM

## 2020-03-09 ENCOUNTER — Encounter: Payer: Self-pay | Admitting: Family Medicine

## 2020-03-09 MED ORDER — GABAPENTIN 300 MG PO CAPS: ORAL_CAPSULE | ORAL | 3 refills | Status: DC

## 2020-03-09 MED ORDER — HYDROXYZINE HCL 25 MG PO TABS
ORAL_TABLET | ORAL | 0 refills | Status: DC
Start: 2020-03-09 — End: 2020-04-06

## 2020-03-12 ENCOUNTER — Ambulatory Visit: Payer: 59 | Admitting: Family Medicine

## 2020-03-12 DIAGNOSIS — Z0289 Encounter for other administrative examinations: Secondary | ICD-10-CM

## 2020-03-17 ENCOUNTER — Other Ambulatory Visit: Payer: Self-pay | Admitting: Orthopedic Surgery

## 2020-03-17 DIAGNOSIS — M5412 Radiculopathy, cervical region: Secondary | ICD-10-CM

## 2020-03-30 ENCOUNTER — Other Ambulatory Visit: Payer: Self-pay | Admitting: Family Medicine

## 2020-03-30 ENCOUNTER — Other Ambulatory Visit: Payer: Self-pay

## 2020-03-30 MED ORDER — LEVOTHYROXINE SODIUM 25 MCG PO TABS: 25.0000 ug | ORAL_TABLET | Freq: Every day | ORAL | 3 refills | Status: DC

## 2020-04-01 ENCOUNTER — Other Ambulatory Visit: Payer: Self-pay | Admitting: Family Medicine

## 2020-04-01 DIAGNOSIS — L74510 Primary focal hyperhidrosis, axilla: Secondary | ICD-10-CM

## 2020-04-03 ENCOUNTER — Other Ambulatory Visit: Payer: Self-pay | Admitting: Family Medicine

## 2020-04-03 ENCOUNTER — Ambulatory Visit (INDEPENDENT_AMBULATORY_CARE_PROVIDER_SITE_OTHER): Payer: 59

## 2020-04-03 ENCOUNTER — Other Ambulatory Visit: Payer: Self-pay

## 2020-04-03 DIAGNOSIS — Z1231 Encounter for screening mammogram for malignant neoplasm of breast: Secondary | ICD-10-CM

## 2020-04-03 DIAGNOSIS — M5412 Radiculopathy, cervical region: Secondary | ICD-10-CM

## 2020-04-06 ENCOUNTER — Other Ambulatory Visit: Payer: Self-pay

## 2020-04-06 ENCOUNTER — Encounter: Payer: Self-pay | Admitting: Family Medicine

## 2020-04-06 ENCOUNTER — Ambulatory Visit (INDEPENDENT_AMBULATORY_CARE_PROVIDER_SITE_OTHER): Payer: 59 | Admitting: Family Medicine

## 2020-04-06 VITALS — BP 126/84 | HR 113 | Temp 98.9°F | Ht 63.0 in | Wt 151.0 lb

## 2020-04-06 DIAGNOSIS — L74510 Primary focal hyperhidrosis, axilla: Secondary | ICD-10-CM | POA: Diagnosis not present

## 2020-04-06 DIAGNOSIS — E039 Hypothyroidism, unspecified: Secondary | ICD-10-CM | POA: Diagnosis not present

## 2020-04-06 DIAGNOSIS — G4700 Insomnia, unspecified: Secondary | ICD-10-CM

## 2020-04-06 DIAGNOSIS — F419 Anxiety disorder, unspecified: Secondary | ICD-10-CM

## 2020-04-06 MED ORDER — NICOTINE POLACRILEX 4 MG MT GUM: 4.0000 mg | CHEWING_GUM | OROMUCOSAL | 2 refills | Status: DC | PRN

## 2020-04-06 MED ORDER — GLYCOPYRROLATE 1 MG PO TABS: 1.0000 mg | ORAL_TABLET | Freq: Every day | ORAL | 1 refills | Status: DC

## 2020-04-06 MED ORDER — NICOTINE 7 MG/24HR TD PT24: 7.0000 mg | MEDICATED_PATCH | Freq: Every day | TRANSDERMAL | 0 refills | Status: DC

## 2020-04-06 MED ORDER — HYDROXYZINE HCL 25 MG PO TABS
25.0000 mg | ORAL_TABLET | Freq: Every day | ORAL | 1 refills | Status: DC
Start: 2020-04-06 — End: 2020-10-07

## 2020-04-06 MED ORDER — BUPROPION HCL ER (SR) 150 MG PO TB12: 150.0000 mg | ORAL_TABLET | Freq: Two times a day (BID) | ORAL | 0 refills | Status: DC

## 2020-04-06 MED ORDER — BACLOFEN 10 MG PO TABS: ORAL_TABLET | ORAL | 1 refills | Status: DC

## 2020-04-06 MED ORDER — BUPROPION HCL ER (SR) 150 MG PO TB12: 150.0000 mg | ORAL_TABLET | Freq: Two times a day (BID) | ORAL | 1 refills | Status: DC

## 2020-04-06 NOTE — Progress Notes (Signed)
Laura Steele , 06-16-1975, 45 y.o., female MRN: [id number] Patient Care Team    Relationship Specialty Notifications Start End  Ma Hillock, DO PCP - General Family Medicine  10/22/15   Ashok Pall, MD Consulting Physician Neurosurgery  06/22/17   Silverio Decamp, MD Consulting Physician Sports Medicine  06/22/17     Chief Complaint  Patient presents with  . Follow-up    CMC; insomnia      Subjective: Laura Steele is a 45 y.o. 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  Hypothyroidism:  Last collection 04/01/2018- TSH WNL.  Patient reports compliance with levothyroxine 25 mcg daily she is due for labs and refills today  Anxiety/Insomnia:  Doing well on Cymbalta- well controlled. Not sleeping well, but reports she has not been receiving her vistaril from her pharmacy.    Depression screen [facility] 2/9 04/06/2020 10/24/2017 06/22/2017 12/18/2016 12/04/2016  Decreased Interest 0 0 1 0 -  Down, Depressed, Hopeless 2 0 0 0 0  PHQ - 2 Score 2 0 1 0 0  Altered sleeping 3 - 2 - 0  Tired, decreased energy 3 - 3 - 0  Change in appetite 3 - 2 - 0  Feeling bad or failure about yourself  0 - 0 - 0  Trouble concentrating 0 - 0 - 0  Moving slowly or fidgety/restless 0 - 0 - 0  Suicidal thoughts 0 - 0 - 0  PHQ-9 Score 11 - 8 - 0  Difficult doing work/chores - - Somewhat difficult - -   GAD 7 : Generalized Anxiety Score 04/06/2020 09/26/2019 03/25/2019 10/03/2018  Nervous, Anxious, on Edge 0 1 0 0  Control/stop worrying 0 1 0 1  Worry too much - different things 0 0 0 1  Trouble relaxing 3 0 3 1  Restless 0 0 0 1  Easily annoyed or irritable 0 0 0 0  Afraid - awful might happen 0 0 0 0  Total GAD 7 Score 3 2 3 4   Anxiety Difficulty - Not difficult at all Not difficult at all Not difficult at all    No Known Allergies Social History   Tobacco Use  . Smoking status: Current Every Day Smoker    Packs/day: 1.00     Types: Cigarettes  . Smokeless tobacco: Never Used  Substance Use Topics  . Alcohol use: Yes    Comment: Pt states was drinkning 40 0z of beer a night. but stopped since sunday.   Past Medical History:  Diagnosis Date  . Allergy   . Anxiety   . Hypoglycemia    Past Surgical History:  Procedure Laterality Date  . ABDOMINAL HYSTERECTOMY    . ANTERIOR CERVICAL DISCECTOMY  03/2017   C6-7; Dr. Christella Noa   Family History  Problem Relation Age of Onset  . Diabetes Mother   . Breast cancer Mother        had masectomy.   . Skin cancer Maternal Grandfather    Allergies as of 04/06/2020   No Known Allergies     Medication List       Accurate as of April 06, 2020  2:40 PM. If you have any questions, ask your nurse or doctor.        STOP taking these medications   traMADol-acetaminophen 37.5-325 MG tablet Commonly known as: ULTRACET Stopped by: Howard Pouch, DO   triazolam 0.25 MG tablet Commonly known as: HALCION Stopped by: Joseph Art  Praise Stennett, DO     TAKE these medications   albuterol 108 (90 Base) MCG/ACT inhaler Commonly known as: VENTOLIN HFA Inhale 1-2 puffs into the lungs daily as needed for wheezing or shortness of breath.   B-complex with vitamin C tablet Take 1 tablet by mouth daily.   baclofen 10 MG tablet Commonly known as: LIORESAL TAKE 1/2 TABLET BY MOUTH 3 TIMES DAILY AS NEEDED   buPROPion 150 MG 12 hr tablet Commonly known as: WELLBUTRIN SR Take 1 tablet (150 mg total) by mouth 2 (two) times daily.   cetirizine 10 MG tablet Commonly known as: ZYRTEC Take 1 tablet (10 mg total) by mouth daily.   DULoxetine 60 MG capsule Commonly known as: CYMBALTA TAKE 1 CAPSULE BY MOUTH EVERY DAY   gabapentin 300 MG capsule Commonly known as: NEURONTIN 1 capsule in the morning, 2 capsules at night   glycopyrrolate 1 MG tablet Commonly known as: Robinul Take 1 tablet (1 mg total) by mouth daily.   hydrOXYzine 25 MG tablet Commonly known as:  ATARAX/VISTARIL Take 1 tablet (25 mg total) by mouth at bedtime. What changed:   how much to take  how to take this  when to take this  additional instructions Changed by: Felix Pacini, DO   levothyroxine 25 MCG tablet Commonly known as: SYNTHROID Take 1 tablet (25 mcg total) by mouth daily before breakfast.   meloxicam 15 MG tablet Commonly known as: MOBIC TAKE 1 TABLET BY MOUTH IN THE MORNING WITH BREAKFAST FOR 2 WEEKS, THEN DAILY AS NEEDED FOR PAIN   nicotine 7 mg/24hr patch Commonly known as: NICODERM CQ - dosed in mg/24 hr Place 1 patch (7 mg total) onto the skin daily. What changed: Another medication with the same name was removed. Continue taking this medication, and follow the directions you see here. Changed by: Felix Pacini, DO   nicotine polacrilex 4 MG gum Commonly known as: [facility] Nicotine Take 1 each (4 mg total) by mouth as needed for smoking cessation.       All past medical history, surgical history, allergies, family history, immunizations andmedications were updated in the EMR today and reviewed under the history and medication portions of their EMR.     ROS: Negative, with the exception of above mentioned in HPI   Objective:  BP 126/84   Pulse (!) 113   Temp 98.9 F (37.2 C) (Oral)   Ht 5\' 3"  (1.6 m)   Wt 151 lb (68.5 kg)   SpO2 96%   BMI 26.75 kg/m  Body mass index is 26.75 kg/m. Gen: Afebrile. No acute distress. Looks well today HENT: AT. [place].  Eyes:Pupils Equal Round Reactive to light, Extraocular movements intact,  Conjunctiva without redness, discharge or icterus. CV: RRR Chest: CTAB, no wheeze or crackles Skin: no rashes, purpura or petechiae.  Neuro:  Normal gait. PERLA. EOMi. Alert. Oriented x3 Psych: Normal affect, dress and demeanor. Normal speech. Normal thought content and judgment.  No exam data present No results found. No results found for this or any previous visit (from the past 24 hour(s)).  Assessment/Plan: Laura Steele is a 45 y.o. female present for OV for  Hypothyroid/hypocalcemia/vit d def -continue  levothyroxine 25 mcg daily.   Continue vit d 5000k otc qd with food> she would like to wait until next appt to retest.   Anxiety/insomnia:  Stable Continue Cymbalta Continue Wellbutrin Continue vistaril 25 mg QHS Could consider BB  F/u 6 mos.   Hyperhidrosis of axilla Stable.  continue  Robinul  Smoking cessation/tobacco abuse:  She has cut back - quit at one point but started back.  Restart patches at 7mg  for 14-30 days.  Nicotine gum prescribed PRN Continue wellbutrin 150 BID F/u 5.5 mos  * rpt CT chest for pulm nodules due next appt as well.      Reviewed expectations re: course of current medical issues.  Discussed self-management of symptoms.  Outlined signs and symptoms indicating need for more acute intervention.  Patient verbalized understanding and all questions were answered.  Patient received an After-Visit Summary.    No orders of the defined types were placed in this encounter.  Meds ordered this encounter  Medications  . hydrOXYzine (ATARAX/VISTARIL) 25 MG tablet    Sig: Take 1 tablet (25 mg total) by mouth at bedtime.    Dispense:  90 tablet    Refill:  1  . glycopyrrolate (ROBINUL) 1 MG tablet    Sig: Take 1 tablet (1 mg total) by mouth daily.    Dispense:  90 tablet    Refill:  1  . DISCONTD: buPROPion (WELLBUTRIN SR) 150 MG 12 hr tablet    Sig: Take 1 tablet (150 mg total) by mouth 2 (two) times daily.    Dispense:  180 tablet    Refill:  0    Pt will need appt if need more refills  . baclofen (LIORESAL) 10 MG tablet    Sig: TAKE 1/2 TABLET BY MOUTH 3 TIMES DAILY AS NEEDED    Dispense:  135 tablet    Refill:  1  . nicotine (NICODERM CQ - DOSED IN MG/24 HR) 7 mg/24hr patch    Sig: Place 1 patch (7 mg total) onto the skin daily.    Dispense:  30 patch    Refill:  0    Generic substitute  . nicotine polacrilex ([employer]S NICOTINE) 4 MG gum    Sig:  Take 1 each (4 mg total) by mouth as needed for smoking cessation.    Dispense:  100 tablet    Refill:  2  . buPROPion (WELLBUTRIN SR) 150 MG 12 hr tablet    Sig: Take 1 tablet (150 mg total) by mouth 2 (two) times daily.    Dispense:  180 tablet    Refill:  1    Pt will need appt if need more refills      Note is dictated utilizing voice recognition software. Although note has been proof read prior to signing, occasional typographical errors still can be missed. If any questions arise, please do not hesitate to call for verification.   electronically signed by:  Howard Pouch, DO  [place]

## 2020-04-08 ENCOUNTER — Other Ambulatory Visit: Payer: Self-pay | Admitting: Orthopedic Surgery

## 2020-04-25 DIAGNOSIS — F10931 Alcohol use, unspecified with withdrawal delirium: Secondary | ICD-10-CM | POA: Insufficient documentation

## 2020-04-25 DIAGNOSIS — F10231 Alcohol dependence with withdrawal delirium: Secondary | ICD-10-CM | POA: Insufficient documentation

## 2020-04-25 DIAGNOSIS — F32A Depression, unspecified: Secondary | ICD-10-CM | POA: Insufficient documentation

## 2020-04-25 HISTORY — DX: Alcohol use, unspecified with withdrawal delirium: F10.931

## 2020-05-05 ENCOUNTER — Ambulatory Visit: Payer: 59 | Admitting: Sports Medicine

## 2020-05-12 ENCOUNTER — Ambulatory Visit: Payer: 59

## 2020-05-12 ENCOUNTER — Encounter: Payer: Self-pay | Admitting: Sports Medicine

## 2020-05-12 ENCOUNTER — Ambulatory Visit: Payer: 59 | Admitting: Sports Medicine

## 2020-05-14 NOTE — Progress Notes (Signed)
Surgical Instructions    Your procedure is scheduled on May 19, 2020.  Report to [facility] Main Entrance "A" at 12:30 PM, then check in with the Admitting office.  Call this number if you have problems the morning of surgery:  [phone]   If you have any questions prior to your surgery date call [phone]: Open Monday-Friday 8am-4pm   Remember:  Do not eat after midnight the night before your surgery  You may drink clear liquids until 12:30 the afternoon of your surgery.   Clear liquids allowed are: Water, Non-Citrus Juices (without pulp), Carbonated Beverages, Clear Tea, Black Coffee Only, and Gatorade   Enhanced Recovery after Surgery for Orthopedics Enhanced Recovery after Surgery is a protocol used to improve the stress on your body and your recovery after surgery.  Patient Instructions  . The night before surgery:  o No food after midnight. ONLY clear liquids after midnight   . The day of surgery (if you do NOT have diabetes):  o Drink ONE (1) Pre-Surgery Clear Ensure by 12:30 the afternoon of surgery   o This drink was given to you during your hospital  pre-op appointment visit. o Nothing else to drink after completing the  Pre-Surgery Clear Ensure.      Take these medicines the morning of surgery with A SIP OF WATER : Duloxetine (Cymbalta) Glycopyrrolate (Robinul) Levothyroxine (Synthroid)   IF needed: Albuterol (Ventolin HFA) inhaler--bring with you to the hospital Baclofen (Lioresal)  As of today, STOP taking any Aspirin (unless otherwise instructed by your surgeon) Aleve, Meloxicam (MOBIC), Naproxen, Ibuprofen, Motrin, Advil, Goody's, BC's, all herbal medications, fish oil, and all vitamins.                     Do not wear jewelry, make up, or nail polish            Do not wear lotions, powders, perfumes/colognes, or deodorant.            Do not shave 48 hours prior to surgery.              Do not bring valuables to the hospital.            [facility] is not responsible for any belongings or valuables.  Do NOT Smoke (Tobacco/Vaping) or drink Alcohol 24 hours prior to your procedure If you use a CPAP at night, you may bring all equipment for your overnight stay.   Contacts, glasses, dentures or bridgework may not be worn into surgery, please bring cases for these belongings   For patients admitted to the hospital, discharge time will be determined by your treatment team.   Patients discharged the day of surgery will not be allowed to drive home, and someone needs to stay with them for 24 hours.    Special instructions:   [place]- Preparing For Surgery  Before surgery, you can play an important role. Because skin is not sterile, your skin needs to be as free of germs as possible. You can reduce the number of germs on your skin by washing with CHG (chlorahexidine gluconate) Soap before surgery.  CHG is an antiseptic cleaner which kills germs and bonds with the skin to continue killing germs even after washing.    Oral Hygiene is also important to reduce your risk of infection.  Remember - BRUSH YOUR TEETH THE MORNING OF SURGERY WITH YOUR REGULAR TOOTHPASTE  Please do not use if you have an allergy to CHG or  antibacterial soaps. If your skin becomes reddened/irritated stop using the CHG.  Do not shave (including legs and underarms) for at least 48 hours prior to first CHG shower. It is OK to shave your face.  Please follow these instructions carefully.   1. Shower the NIGHT BEFORE SURGERY and the MORNING OF SURGERY  2. If you chose to wash your hair, wash your hair first as usual with your normal shampoo.  3. After you shampoo, rinse your hair and body thoroughly to remove the shampoo.  4. Wash Face and genitals (private parts) with your normal soap.   5.  Shower the NIGHT BEFORE SURGERY and the MORNING OF SURGERY with CHG Soap.   6. Use CHG Soap as you would any other liquid soap. You can apply CHG directly to the skin  and wash gently with a scrungie or a clean washcloth.   7. Apply the CHG Soap to your body ONLY FROM THE NECK DOWN.  Do not use on open wounds or open sores. Avoid contact with your eyes, ears, mouth and genitals (private parts). Wash Face and genitals (private parts)  with your normal soap.   8. Wash thoroughly, paying special attention to the area where your surgery will be performed.  9. Thoroughly rinse your body with warm water from the neck down.  10. DO NOT shower/wash with your normal soap after using and rinsing off the CHG Soap.  11. Pat yourself dry with a CLEAN TOWEL.  12. Wear CLEAN PAJAMAS to bed the night before surgery  13. Place CLEAN SHEETS on your bed the night before your surgery  14. DO NOT SLEEP WITH PETS.   Day of Surgery: Wear Clean/Comfortable clothing the morning of surgery Do not apply any deodorants/lotions.   Remember to brush your teeth WITH YOUR REGULAR TOOTHPASTE.   Please read over the following fact sheets that you were given.

## 2020-05-17 ENCOUNTER — Encounter (HOSPITAL_COMMUNITY): Payer: Self-pay

## 2020-05-17 ENCOUNTER — Other Ambulatory Visit: Payer: Self-pay

## 2020-05-17 ENCOUNTER — Other Ambulatory Visit (HOSPITAL_COMMUNITY)
Admission: RE | Admit: 2020-05-17 | Discharge: 2020-05-17 | Disposition: A | Discharge status: Home / Self Care | Payer: 59 | Source: Ambulatory Visit | Attending: Orthopedic Surgery | Admitting: Orthopedic Surgery

## 2020-05-17 ENCOUNTER — Encounter (HOSPITAL_COMMUNITY)
Admission: RE | Admit: 2020-05-17 | Discharge: 2020-05-17 | Disposition: A | Discharge status: Home / Self Care | Payer: 59 | Source: Ambulatory Visit | Attending: Orthopedic Surgery | Admitting: Orthopedic Surgery

## 2020-05-17 DIAGNOSIS — Z01812 Encounter for preprocedural laboratory examination: Secondary | ICD-10-CM | POA: Insufficient documentation

## 2020-05-17 DIAGNOSIS — Z20822 Contact with and (suspected) exposure to covid-19: Secondary | ICD-10-CM | POA: Insufficient documentation

## 2020-05-17 HISTORY — DX: Headache, unspecified: R51.9

## 2020-05-17 HISTORY — DX: Pneumonia, unspecified organism: J18.9

## 2020-05-17 HISTORY — DX: Hypothyroidism, unspecified: E03.9

## 2020-05-17 HISTORY — DX: Unspecified asthma, uncomplicated: J45.909

## 2020-05-17 LAB — CBC WITH DIFFERENTIAL/PLATELET
Abs Immature Granulocytes: 0.05 10*3/uL (ref 0.00–0.07)
Basophils Absolute: 0.1 10*3/uL (ref 0.0–0.1)
Basophils Relative: 1 %
Eosinophils Absolute: 0.1 10*3/uL (ref 0.0–0.5)
Eosinophils Relative: 1 %
HCT: 49.4 % — ABNORMAL HIGH (ref 36.0–46.0)
Hemoglobin: 17.3 g/dL — ABNORMAL HIGH (ref 12.0–15.0)
Immature Granulocytes: 0 %
Lymphocytes Relative: 17 %
Lymphs Abs: 2 10*3/uL (ref 0.7–4.0)
MCH: 32.8 pg (ref 26.0–34.0)
MCHC: 35 g/dL (ref 30.0–36.0)
MCV: 93.7 fL (ref 80.0–100.0)
Monocytes Absolute: 1.3 10*3/uL — ABNORMAL HIGH (ref 0.1–1.0)
Monocytes Relative: 11 %
Neutro Abs: 8.1 10*3/uL — ABNORMAL HIGH (ref 1.7–7.7)
Neutrophils Relative %: 70 %
Platelets: 286 10*3/uL (ref 150–400)
RBC: 5.27 MIL/uL — ABNORMAL HIGH (ref 3.87–5.11)
RDW: 13.3 % (ref 11.5–15.5)
WBC: 11.7 10*3/uL — ABNORMAL HIGH (ref 4.0–10.5)
nRBC: 0 % (ref 0.0–0.2)

## 2020-05-17 LAB — COMPREHENSIVE METABOLIC PANEL
ALT: 23 U/L (ref 0–44)
AST: 31 U/L (ref 15–41)
Albumin: 4.2 g/dL (ref 3.5–5.0)
Alkaline Phosphatase: 87 U/L (ref 38–126)
Anion gap: 11 (ref 5–15)
BUN: 5 mg/dL — ABNORMAL LOW (ref 6–20)
CO2: 26 mmol/L (ref 22–32)
Calcium: 9.3 mg/dL (ref 8.9–10.3)
Chloride: 95 mmol/L — ABNORMAL LOW (ref 98–111)
Creatinine, Ser: 0.64 mg/dL (ref 0.44–1.00)
GFR, Estimated: >60 mL/min (ref >60)
Glucose, Bld: 109 mg/dL — ABNORMAL HIGH (ref 70–99)
Potassium: 3.4 mmol/L — ABNORMAL LOW (ref 3.5–5.1)
Sodium: 132 mmol/L — ABNORMAL LOW (ref 135–145)
Total Bilirubin: 0.8 mg/dL (ref 0.3–1.2)
Total Protein: 7.4 g/dL (ref 6.5–8.1)

## 2020-05-17 LAB — URINALYSIS, ROUTINE W REFLEX MICROSCOPIC
Bilirubin Urine: NEGATIVE
Glucose, UA: NEGATIVE mg/dL
Hgb urine dipstick: NEGATIVE
Ketones, ur: NEGATIVE mg/dL
Leukocytes,Ua: NEGATIVE
Nitrite: NEGATIVE
Protein, ur: NEGATIVE mg/dL
Specific Gravity, Urine: 1.01 (ref 1.005–1.030)
pH: 6 (ref 5.0–8.0)

## 2020-05-17 LAB — SURGICAL PCR SCREEN
MRSA, PCR: NEGATIVE
Staphylococcus aureus: NEGATIVE

## 2020-05-17 LAB — PROTIME-INR
INR: 0.9 (ref 0.8–1.2)
Prothrombin Time: 11.7 seconds (ref 11.4–15.2)

## 2020-05-17 LAB — TYPE AND SCREEN
ABO/RH(D): A POS
Antibody Screen: NEGATIVE

## 2020-05-17 LAB — APTT: aPTT: 27 seconds (ref 24–36)

## 2020-05-17 NOTE — Progress Notes (Signed)
PCP - Ma Hillock, DO Cardiologist - Denies  PPM/ICD - Denies  Chest x-ray - 11/24/19 EKG - 06/24/19 Stress Test - Denies ECHO - Denies Cardiac Cath - Denies  Sleep Study - Denies  Patient denies being diabetic.  Blood Thinner Instructions: N/A Aspirin Instructions: N/A  ERAS Protcol - Yes PRE-SURGERY Ensure or G2- Ensure given.  COVID TEST- 05/17/20   Anesthesia review: Yes, elevated BP at PAT visit:  Per patient, she also smoked 2 cigarettes prior to arrival for PAT appointment. C/O Neck and shoulder pain, but per patient she did not take any pain medication because she knew she had to drive to her appointment today. Patient educated on Smoking cessation. Karoline Caldwell, PA-C notified. Instructed pt to monitor her BP at home and if persistently elevated, pt is to follow up with her PCP prior to sx.    05/17/20 1047 05/17/20 1114 05/17/20 1130  Vitals  Temp 98.3 F (36.8 C)  --   --   Temp Source Oral  --   --   Pulse Rate (!) 112  --   --   Pulse Rate Source Dinamap  --   --   Resp 18  --   --   Respiratory Pattern Regular  --   --   BP (!) 152/109  --  (!) 157/113  SpO2 100 %  --   --   Height and Weight  Height 5\' 3"  (1.6 m)  --   --   Height Method Stated  --   --   Weight 63.8 kg  --   --   Weight Method Actual  --   --   BMI (Calculated) 24.91  --   --   BSA (Calculated - sq m) 1.68 sq meters  --   --   Pain Assessment  Pain Scale  --  0-10  --   Pain Score  --  10  --   Pain Type  --  Chronic pain  --   Pain Location  --  Shoulder  --   Pain Orientation  --  Left  --   Pain Descriptors / Indicators  --  Aching;Spasm;Radiating;Constant (RADIATES DOWN TO LEFT ARM)  --   Pain Onset  --  On-going  --   Patients Stated Pain Goal  --  5  --   Pain Intervention(s)  --  RN made aware  --   LOC  Level of Consciousness  --  Alert  --   Orientation Level  --  Oriented X4  --     05/17/20 1135  Vitals  Temp  --   Temp Source  --   Pulse Rate  --   Pulse  Rate Source  --   Resp  --   Respiratory Pattern  --   BP (!) 140/100 (manual)  SpO2  --   Height and Weight  Height  --   Height Method  --   Weight  --   Weight Method  --   BMI (Calculated)  --   BSA (Calculated - sq m)  --   Pain Assessment  Pain Scale  --   Pain Score  --   Pain Type  --   Pain Location  --   Pain Orientation  --   Pain Descriptors / Indicators  --   Pain Onset  --   Patients Stated Pain Goal  --   Pain Intervention(s)  --   LOC  Level of  Consciousness  --   Orientation Level  --        Patient denies shortness of breath, fever, cough and chest pain at PAT appointment   All instructions explained to the patient, with a verbal understanding of the material. Patient agrees to go over the instructions while at home for a better understanding. Patient also instructed to self quarantine after being tested for COVID-19. The opportunity to ask questions was provided.

## 2020-05-17 NOTE — Anesthesia Preprocedure Evaluation (Addendum)
Anesthesia Evaluation  Patient identified by MRN, date of birth, ID band Patient awake    Reviewed: Allergy & Precautions, H&P , NPO status , Patient's Chart, lab work & pertinent test results  Airway Mallampati: II  TM Distance: >3 FB Neck ROM: Limited    Dental  (+) Missing, Poor Dentition, Loose, Chipped, Dental Advisory Given,    Pulmonary asthma , Current Smoker,    Pulmonary exam normal breath sounds clear to auscultation       Cardiovascular Normal cardiovascular exam Rhythm:Regular Rate:Normal  Probable untreated HTN   Neuro/Psych Anxiety negative neurological ROS     GI/Hepatic negative GI ROS, Neg liver ROS,   Endo/Other  Hypothyroidism   Renal/GU negative Renal ROS  negative genitourinary   Musculoskeletal negative musculoskeletal ROS (+)   Abdominal   Peds negative pediatric ROS (+)  Hematology negative hematology ROS (+)   Anesthesia Other Findings   Reproductive/Obstetrics negative OB ROS                           Anesthesia Physical Anesthesia Plan  ASA: II  Anesthesia Plan: General   Post-op Pain Management:    Induction: Intravenous  PONV Risk Score and Plan: 2 and Ondansetron, Dexamethasone and Treatment may vary due to age or medical condition  Airway Management Planned: Oral ETT  Additional Equipment:   Intra-op Plan:   Post-operative Plan: Extubation in OR  Informed Consent: I have reviewed the patients History and Physical, chart, labs and discussed the procedure including the risks, benefits and alternatives for the proposed anesthesia with the patient or authorized representative who has indicated his/her understanding and acceptance.     Dental advisory given  Plan Discussed with: CRNA and Surgeon  Anesthesia Plan Comments: (PAT note by Karoline Caldwell, PA-C: BP elevated at preadmission testing appointment (152/109 initially, 140/100 on recheck). Pt  relates this to pain and anxiety, states BP usually normal. She does not have diagnosis of HTN, she is not on any anti HTN meds. Review of records available in Epic shows history of normotensive readings with sporadic hypertensive readings. Advised pt to continue to monitor and contact PCP if remains elevated. She understands that markedly elevated BP on DOS could be cause for cancellation. Dr. Lynann Bologna made aware.   BP Readings from Last 3 Encounters: 05/17/20 (!) 140/100 04/06/20 126/84 11/28/19 (!) 149/87   Review of records in care everywhere shows recent admission to [facility]/20 for altered mental status. Initially thought etoh withdrawal delerium, however discharge summary states possible psychiatric etiology. Urine tox screen was negative. Ethanol level undetectable. Per dc summary, "45 year old female with a history of chronic pain syndrome related to cervical disc disease presented to the emergency department after her husband found her with altered mental status with nausea and vomiting. Confusion started 1 day prior. Husband forced her to come to the emergency department at which time she was belligerent in triage. In the emergency department vital signs were unremarkable. She did receive Ativan IV x3 Haldol, normal saline bolus. Hospital Course:  Patient awoke and was oriented. She had CIWA scores of 0. She had CMP and lipase which were normal. Head CT normal. Patient tolerated breakfast and lunch w/o issue. Patient lungs clear. She will be discharged at her request. Discussed with patient and question possible bipolar disorder."  Pt reported she has no recollection of this admission. She says she does not know why she was hospitalized. She denies etoh abuse, states she  drinks "a couple beers every other day." Denis illicit drug use.  Denies any recurrence of altered mental status.  Current every day smoker.   Hx of C6-7 disc arthroplasty.   Preop labs reviewed,  sodium mildly low 132, otherwise unremarkable.  EKG 11/24/19: Sinus rhythm. Rate 100.   )      Anesthesia Quick Evaluation

## 2020-05-17 NOTE — Progress Notes (Addendum)
Anesthesia Chart Review:  BP elevated at preadmission testing appointment (152/109 initially, 140/100 on recheck). Pt relates this to pain and anxiety, states BP usually normal. She does not have diagnosis of HTN, she is not on any anti HTN meds. Review of records available in Epic shows history of normotensive readings with sporadic hypertensive readings. Advised pt to continue to monitor and contact PCP if remains elevated. She understands that markedly elevated BP on DOS could be cause for cancellation. Dr. Lynann Bologna made aware.   BP Readings from Last 3 Encounters:  05/17/20 (!) 140/100  04/06/20 126/84  11/28/19 (!) 149/87    Review of records in care everywhere shows recent admission to [facility] for altered mental status. Initially thought etoh withdrawal delerium, however discharge summary states possible psychiatric etiology. Urine tox screen was negative. Ethanol level undetectable. Per dc summary, "45 year old female with a history of chronic pain syndrome related to cervical disc disease presented to the emergency department after her husband found her with altered mental status with nausea and vomiting. Confusion started 1 day prior. Husband forced her to come to the emergency department at which time she was belligerent in triage. In the emergency department vital signs were unremarkable. She did receive Ativan IV x3 Haldol, normal saline bolus. Hospital Course:  Patient awoke and was oriented. She had CIWA scores of 0. She had CMP and lipase which were normal. Head CT normal. Patient tolerated breakfast and lunch w/o issue. Patient lungs clear. She will be discharged at her request. Discussed with patient and question possible bipolar disorder."  Pt reported she has no recollection of this admission. She says she does not know why she was hospitalized. She denies etoh abuse, states she drinks "a couple beers every other day." Denis illicit drug use.  Denies any recurrence  of altered mental status.  Current every day smoker.   Hx of C6-7 disc arthroplasty.   Preop labs reviewed, sodium mildly low 132, otherwise unremarkable.  EKG 11/24/19: Sinus rhythm. Rate 100.     Wynonia Musty [facility] Stay [facility]/Anesthesiology Phone [phone] 05/17/2020 1:42 PM

## 2020-05-18 LAB — SARS CORONAVIRUS 2 (TAT 6-24 HRS): SARS Coronavirus 2: NEGATIVE

## 2020-05-19 ENCOUNTER — Ambulatory Visit (HOSPITAL_COMMUNITY)
Admission: RE | Admit: 2020-05-19 | Discharge: 2020-05-19 | Disposition: A | Discharge status: Home / Self Care | Payer: 59 | Attending: Orthopedic Surgery | Admitting: Orthopedic Surgery

## 2020-05-19 ENCOUNTER — Ambulatory Visit (HOSPITAL_COMMUNITY): Payer: 59 | Admitting: Physician Assistant

## 2020-05-19 ENCOUNTER — Encounter (HOSPITAL_COMMUNITY): Payer: Self-pay | Admitting: Orthopedic Surgery

## 2020-05-19 ENCOUNTER — Other Ambulatory Visit: Payer: Self-pay

## 2020-05-19 ENCOUNTER — Ambulatory Visit (HOSPITAL_COMMUNITY): Payer: 59

## 2020-05-19 ENCOUNTER — Encounter (HOSPITAL_COMMUNITY)
Admission: RE | Disposition: A | Discharge status: Home / Self Care | Payer: Self-pay | Source: Home / Self Care | Attending: Orthopedic Surgery

## 2020-05-19 DIAGNOSIS — M4802 Spinal stenosis, cervical region: Secondary | ICD-10-CM | POA: Insufficient documentation

## 2020-05-19 DIAGNOSIS — M5412 Radiculopathy, cervical region: Secondary | ICD-10-CM | POA: Insufficient documentation

## 2020-05-19 DIAGNOSIS — F17210 Nicotine dependence, cigarettes, uncomplicated: Secondary | ICD-10-CM | POA: Diagnosis not present

## 2020-05-19 DIAGNOSIS — Z79899 Other long term (current) drug therapy: Secondary | ICD-10-CM | POA: Diagnosis not present

## 2020-05-19 DIAGNOSIS — Z79890 Hormone replacement therapy (postmenopausal): Secondary | ICD-10-CM | POA: Diagnosis not present

## 2020-05-19 DIAGNOSIS — Z419 Encounter for procedure for purposes other than remedying health state, unspecified: Secondary | ICD-10-CM

## 2020-05-19 HISTORY — PX: ANTERIOR CERVICAL DECOMP/DISCECTOMY FUSION: SHX1161

## 2020-05-19 LAB — ABO/RH: ABO/RH(D): A POS

## 2020-05-19 SURGERY — ANTERIOR CERVICAL DECOMPRESSION/DISCECTOMY FUSION 1 LEVEL
Anesthesia: General | Site: Neck

## 2020-05-19 MED ORDER — ONDANSETRON HCL 4 MG/2ML IJ SOLN
INTRAMUSCULAR | Status: AC
  Filled 2020-05-19: qty 2

## 2020-05-19 MED ORDER — POVIDONE-IODINE 7.5 % EX SOLN
Freq: Once | CUTANEOUS | Status: AC
  Administered 2020-05-19: 1 via TOPICAL

## 2020-05-19 MED ORDER — THROMBIN 20000 UNITS EX SOLR
CUTANEOUS | Status: DC | PRN
  Administered 2020-05-19: 20 mL via TOPICAL

## 2020-05-19 MED ORDER — PROPOFOL 10 MG/ML IV BOLUS
INTRAVENOUS | Status: AC
  Filled 2020-05-19: qty 40

## 2020-05-19 MED ORDER — OXYCODONE HCL 5 MG PO TABS
ORAL_TABLET | ORAL | Status: AC
  Administered 2020-05-19: 5 mg via ORAL
  Filled 2020-05-19: qty 1

## 2020-05-19 MED ORDER — OXYCODONE HCL 5 MG PO TABS: 5.0000 mg | ORAL_TABLET | Freq: Once | ORAL | Status: AC | PRN

## 2020-05-19 MED ORDER — ROCURONIUM BROMIDE 10 MG/ML (PF) SYRINGE
PREFILLED_SYRINGE | INTRAVENOUS | Status: DC | PRN
  Administered 2020-05-19: 20 mg via INTRAVENOUS
  Administered 2020-05-19: 40 mg via INTRAVENOUS

## 2020-05-19 MED ORDER — ORAL CARE MOUTH RINSE: 15.0000 mL | Freq: Once | OROMUCOSAL | Status: AC

## 2020-05-19 MED ORDER — FENTANYL CITRATE (PF) 250 MCG/5ML IJ SOLN
INTRAMUSCULAR | Status: AC
  Filled 2020-05-19: qty 5

## 2020-05-19 MED ORDER — OXYCODONE HCL 5 MG/5ML PO SOLN: 5.0000 mg | Freq: Once | ORAL | Status: AC | PRN

## 2020-05-19 MED ORDER — CEFAZOLIN SODIUM-DEXTROSE 2-4 GM/100ML-% IV SOLN
2.0000 g | INTRAVENOUS | Status: AC
  Administered 2020-05-19: 2 g via INTRAVENOUS
  Filled 2020-05-19: qty 100

## 2020-05-19 MED ORDER — LACTATED RINGERS IV SOLN: INTRAVENOUS | Status: DC | PRN

## 2020-05-19 MED ORDER — DEXMEDETOMIDINE HCL IN NACL 200 MCG/50ML IV SOLN
INTRAVENOUS | Status: DC | PRN
  Administered 2020-05-19: 4 ug via INTRAVENOUS
  Administered 2020-05-19: 8 ug via INTRAVENOUS

## 2020-05-19 MED ORDER — METHOCARBAMOL 500 MG PO TABS: 500.0000 mg | ORAL_TABLET | Freq: Four times a day (QID) | ORAL | 0 refills | Status: DC | PRN

## 2020-05-19 MED ORDER — ONDANSETRON HCL 4 MG/2ML IJ SOLN
INTRAMUSCULAR | Status: DC | PRN
  Administered 2020-05-19: 4 mg via INTRAVENOUS

## 2020-05-19 MED ORDER — SUGAMMADEX SODIUM 200 MG/2ML IV SOLN
INTRAVENOUS | Status: DC | PRN
  Administered 2020-05-19: 200 mg via INTRAVENOUS

## 2020-05-19 MED ORDER — LIDOCAINE 2% (20 MG/ML) 5 ML SYRINGE
INTRAMUSCULAR | Status: DC | PRN
  Administered 2020-05-19: 80 mg via INTRAVENOUS

## 2020-05-19 MED ORDER — HYDROMORPHONE HCL 1 MG/ML IJ SOLN
INTRAMUSCULAR | Status: AC
  Administered 2020-05-19: 0.5 mg via INTRAVENOUS
  Filled 2020-05-19: qty 1

## 2020-05-19 MED ORDER — FENTANYL CITRATE (PF) 250 MCG/5ML IJ SOLN
INTRAMUSCULAR | Status: DC | PRN
  Administered 2020-05-19: 100 ug via INTRAVENOUS
  Administered 2020-05-19: 150 ug via INTRAVENOUS
  Administered 2020-05-19: 50 ug via INTRAVENOUS

## 2020-05-19 MED ORDER — DEXAMETHASONE SODIUM PHOSPHATE 10 MG/ML IJ SOLN
INTRAMUSCULAR | Status: DC | PRN
  Administered 2020-05-19: 10 mg via INTRAVENOUS

## 2020-05-19 MED ORDER — PROMETHAZINE HCL 25 MG/ML IJ SOLN: 6.2500 mg | INTRAMUSCULAR | Status: DC | PRN

## 2020-05-19 MED ORDER — DEXAMETHASONE SODIUM PHOSPHATE 10 MG/ML IJ SOLN
INTRAMUSCULAR | Status: AC
  Filled 2020-05-19: qty 1

## 2020-05-19 MED ORDER — SUCCINYLCHOLINE CHLORIDE 200 MG/10ML IV SOSY
PREFILLED_SYRINGE | INTRAVENOUS | Status: DC | PRN
  Administered 2020-05-19: 100 mg via INTRAVENOUS

## 2020-05-19 MED ORDER — BUPIVACAINE-EPINEPHRINE 0.25% -1:200000 IJ SOLN
INTRAMUSCULAR | Status: DC | PRN
  Administered 2020-05-19: 5 mL

## 2020-05-19 MED ORDER — 0.9 % SODIUM CHLORIDE (POUR BTL) OPTIME
TOPICAL | Status: DC | PRN
  Administered 2020-05-19: 1000 mL

## 2020-05-19 MED ORDER — HYDROCODONE-ACETAMINOPHEN 5-325 MG PO TABS: 1.0000 | ORAL_TABLET | Freq: Four times a day (QID) | ORAL | 0 refills | Status: DC | PRN

## 2020-05-19 MED ORDER — MIDAZOLAM HCL 2 MG/2ML IJ SOLN
INTRAMUSCULAR | Status: AC
  Filled 2020-05-19: qty 2

## 2020-05-19 MED ORDER — ACETAMINOPHEN 10 MG/ML IV SOLN: 1000.0000 mg | Freq: Once | INTRAVENOUS | Status: DC | PRN

## 2020-05-19 MED ORDER — CHLORHEXIDINE GLUCONATE 0.12 % MT SOLN
15.0000 mL | Freq: Once | OROMUCOSAL | Status: AC
  Administered 2020-05-19: 15 mL via OROMUCOSAL
  Filled 2020-05-19: qty 15

## 2020-05-19 MED ORDER — HYDROMORPHONE HCL 1 MG/ML IJ SOLN
0.2500 mg | INTRAMUSCULAR | Status: DC | PRN
  Administered 2020-05-19: 0.5 mg via INTRAVENOUS

## 2020-05-19 MED ORDER — THROMBIN 20000 UNITS EX SOLR
CUTANEOUS | Status: AC
  Filled 2020-05-19: qty 20000

## 2020-05-19 MED ORDER — LACTATED RINGERS IV SOLN: INTRAVENOUS | Status: DC

## 2020-05-19 MED ORDER — PROPOFOL 10 MG/ML IV BOLUS
INTRAVENOUS | Status: DC | PRN
  Administered 2020-05-19: 30 mg via INTRAVENOUS
  Administered 2020-05-19: 150 mg via INTRAVENOUS

## 2020-05-19 SURGICAL SUPPLY — 69 items
BENZOIN TINCTURE PRP APPL 2/3 (GAUZE/BANDAGES/DRESSINGS) ×2 IMPLANT
BIT DRILL NEURO 2X3.1 SFT TUCH (MISCELLANEOUS) ×1 IMPLANT
BIT DRILL SKYLINE 12MM (BIT) ×1 IMPLANT
BLADE CLIPPER SURG (BLADE) ×2 IMPLANT
BLADE SURG 15 STRL LF DISP TIS (BLADE) ×1 IMPLANT
BLADE SURG 15 STRL SS (BLADE) ×2
BONE VIVIGEN FORMABLE 1.3CC (Bone Implant) ×2 IMPLANT
CARTRIDGE OIL MAESTRO DRILL (MISCELLANEOUS) ×1 IMPLANT
CORD BIPOLAR FORCEPS 12FT (ELECTRODE) ×2 IMPLANT
COVER SURGICAL LIGHT HANDLE (MISCELLANEOUS) ×2 IMPLANT
COVER WAND RF STERILE (DRAPES) ×2 IMPLANT
DIFFUSER DRILL AIR PNEUMATIC (MISCELLANEOUS) ×2 IMPLANT
DRAIN JACKSON RD 7FR 3/32 (WOUND CARE) IMPLANT
DRAPE C-ARM 42X72 X-RAY (DRAPES) ×2 IMPLANT
DRAPE POUCH INSTRU U-SHP 10X18 (DRAPES) ×2 IMPLANT
DRAPE SURG 17X23 STRL (DRAPES) ×6 IMPLANT
DRILL BIT SKYLINE 12MM (BIT) ×2
DRILL NEURO 2X3.1 SOFT TOUCH (MISCELLANEOUS) ×2
DURAPREP 26ML APPLICATOR (WOUND CARE) ×2 IMPLANT
ELECT COATED BLADE 2.86 ST (ELECTRODE) ×2 IMPLANT
ELECT REM PT RETURN 9FT ADLT (ELECTROSURGICAL) ×2
ELECTRODE REM PT RTRN 9FT ADLT (ELECTROSURGICAL) ×1 IMPLANT
EVACUATOR SILICONE 100CC (DRAIN) IMPLANT
GAUZE 4X4 16PLY RFD (DISPOSABLE) ×2 IMPLANT
GAUZE SPONGE 4X4 12PLY STRL (GAUZE/BANDAGES/DRESSINGS) ×2 IMPLANT
GAUZE SPONGE 4X4 12PLY STRL LF (GAUZE/BANDAGES/DRESSINGS) ×2 IMPLANT
GLOVE BIO SURGEON STRL SZ7 (GLOVE) ×2 IMPLANT
GLOVE BIO SURGEON STRL SZ8 (GLOVE) ×2 IMPLANT
GLOVE SRG 8 PF TXTR STRL LF DI (GLOVE) ×1 IMPLANT
GLOVE SURG UNDER POLY LF SZ7 (GLOVE) ×4 IMPLANT
GLOVE SURG UNDER POLY LF SZ8 (GLOVE) ×2
GOWN STRL REUS W/ TWL LRG LVL3 (GOWN DISPOSABLE) ×1 IMPLANT
GOWN STRL REUS W/ TWL XL LVL3 (GOWN DISPOSABLE) ×1 IMPLANT
GOWN STRL REUS W/TWL LRG LVL3 (GOWN DISPOSABLE) ×2
GOWN STRL REUS W/TWL XL LVL3 (GOWN DISPOSABLE) ×2
INTERLOCK LRDTC CRVCL VBR 6MM (Bone Implant) ×1 IMPLANT
IV CATH 14GX2 1/4 (CATHETERS) ×2 IMPLANT
KIT BASIN OR (CUSTOM PROCEDURE TRAY) ×2 IMPLANT
KIT TURNOVER KIT B (KITS) ×2 IMPLANT
LORDOTIC CERVICAL VBR 6MM SM (Bone Implant) ×2 IMPLANT
MANIFOLD NEPTUNE II (INSTRUMENTS) ×2 IMPLANT
NEEDLE PRECISIONGLIDE 27X1.5 (NEEDLE) ×2 IMPLANT
NEEDLE SPNL 20GX3.5 QUINCKE YW (NEEDLE) ×2 IMPLANT
NS IRRIG 1000ML POUR BTL (IV SOLUTION) ×2 IMPLANT
OIL CARTRIDGE MAESTRO DRILL (MISCELLANEOUS) ×2
PACK ORTHO CERVICAL (CUSTOM PROCEDURE TRAY) ×2 IMPLANT
PAD ARMBOARD 7.5X6 YLW CONV (MISCELLANEOUS) ×4 IMPLANT
PATTIES SURGICAL .5 X.5 (GAUZE/BANDAGES/DRESSINGS) IMPLANT
PATTIES SURGICAL .5 X1 (DISPOSABLE) IMPLANT
PIN DISTRACTION 14 (PIN) ×4 IMPLANT
PLATE SKYLINE 12MM (Plate) ×2 IMPLANT
POSITIONER HEAD DONUT 9IN (MISCELLANEOUS) ×2 IMPLANT
SCREW SKYLINE VARIABLE LG (Screw) ×8 IMPLANT
SPONGE INTESTINAL PEANUT (DISPOSABLE) ×2 IMPLANT
SPONGE SURGIFOAM ABS GEL 100 (HEMOSTASIS) IMPLANT
STRIP CLOSURE SKIN 1/2X4 (GAUZE/BANDAGES/DRESSINGS) ×2 IMPLANT
SURGIFLO W/THROMBIN 8M KIT (HEMOSTASIS) IMPLANT
SUT MNCRL AB 4-0 PS2 18 (SUTURE) ×2 IMPLANT
SUT SILK 4 0 (SUTURE)
SUT SILK 4-0 18XBRD TIE 12 (SUTURE) IMPLANT
SUT VIC AB 2-0 CT2 18 VCP726D (SUTURE) ×2 IMPLANT
SYR BULB IRRIG 60ML STRL (SYRINGE) ×2 IMPLANT
SYR CONTROL 10ML LL (SYRINGE) ×4 IMPLANT
TAPE CLOTH 4X10 WHT NS (GAUZE/BANDAGES/DRESSINGS) ×2 IMPLANT
TAPE UMBILICAL COTTON 1/8X30 (MISCELLANEOUS) ×2 IMPLANT
TOWEL GREEN STERILE (TOWEL DISPOSABLE) ×2 IMPLANT
TOWEL GREEN STERILE FF (TOWEL DISPOSABLE) ×2 IMPLANT
WATER STERILE IRR 1000ML POUR (IV SOLUTION) ×2 IMPLANT
YANKAUER SUCT BULB TIP NO VENT (SUCTIONS) ×2 IMPLANT

## 2020-05-19 NOTE — Anesthesia Procedure Notes (Signed)
Procedure Name: Intubation Date/Time: 05/19/2020 3:12 PM Performed by: Eligha Bridegroom, CRNA Pre-anesthesia Checklist: Patient identified, Emergency Drugs available, Suction available, Patient being monitored and Timeout performed Patient Re-evaluated:Patient Re-evaluated prior to induction Oxygen Delivery Method: Circle system utilized Preoxygenation: Pre-oxygenation with 100% oxygen Induction Type: IV induction and Rapid sequence Laryngoscope Size: Glidescope Grade View: Grade II Tube type: Oral Tube size: 7.0 mm Number of attempts: 1 Airway Equipment and Method: Stylet and Video-laryngoscopy Placement Confirmation: ETT inserted through vocal cords under direct vision,  positive ETCO2 and breath sounds checked- equal and bilateral Secured at: 21 cm Tube secured with: Tape Dental Injury: Teeth and Oropharynx as per pre-operative assessment

## 2020-05-19 NOTE — Anesthesia Postprocedure Evaluation (Signed)
Anesthesia Post Note  Patient: Engineer, maintenance (IT)  Procedure(s) Performed: ANTERIOR CERVICAL DECOMPRESSION FUSION CERVICAL 5 - CERVICAL 6 WITH INSTRUMENTATION AND ALLOGRAFT (N/A Neck)     Patient location during evaluation: PACU Anesthesia Type: General Level of consciousness: awake and alert Pain management: pain level controlled Vital Signs Assessment: post-procedure vital signs reviewed and stable Respiratory status: spontaneous breathing, nonlabored ventilation, respiratory function stable and patient connected to nasal cannula oxygen Cardiovascular status: blood pressure returned to baseline and stable Postop Assessment: no apparent nausea or vomiting Anesthetic complications: no   No complications documented.  Last Vitals:  Vitals:   05/19/20 1713 05/19/20 1721  BP:  (!) 153/98  Pulse:    Resp:    Temp: 36.8 C   SpO2:      Last Pain:  Vitals:   05/19/20 1735  TempSrc:   PainSc: 7                  Catalina Gravel

## 2020-05-19 NOTE — Op Note (Signed)
PATIENT NAME: Laura Steele   MEDICAL RECORD NO.:   [id number]    DATE OF BIRTH: [birth year]   DATE OF PROCEDURE: 05/19/2020                               OPERATIVE REPORT     PREOPERATIVE DIAGNOSES: 1. Left-sided cervical radiculopathy. 2. Spinal stenosis 5/6   POSTOPERATIVE DIAGNOSES: 1. Left-sided cervical radiculopathy. 2. Spinal stenosis 5/6   PROCEDURE: 1. Anterior cervical decompression and fusion C5/6 2. Placement of anterior instrumentation, C5/6 3. Insertion of interbody device x 1 (76mm Titan intervertebral spacer). 4. Intraoperative use of fluoroscopy. 5. Use of morselized allograft - ViviGen.   SURGEON:  Phylliss Bob, MD   ASSISTANT:  Pricilla Holm, PA-C.   ANESTHESIA:  General endotracheal anesthesia.   COMPLICATIONS:  None.   DISPOSITION:  Stable.   ESTIMATED BLOOD LOSS:  Minimal.   INDICATIONS FOR SURGERY:  Briefly, Laura Steele is a pleasant 45 -year- old female, who did present to me with severe pain in her neck and left shoulder and arm.   The patient's MRI did reveal the findings noted above.  Given the ongoing rather debilitating pain and lack of improvement with appropriate treatment measures, we did discuss proceeding with the procedure noted above.  The patient was fully aware of the risks and limitations of surgery as outlined in my preoperative note.   OPERATIVE DETAILS:  On 05/19/2020, the patient was brought to surgery and general endotracheal anesthesia was administered.  The patient was placed supine on the hospital bed. The neck was gently extended.  All bony prominences were meticulously padded.  The neck was prepped and draped in the usual sterile fashion.  At this point, I did make a left-sided transverse incision.  The platysma was incised.  A Smith-Robinson approach was used and the anterior spine was identified. A self-retaining retractor was placed.  I then subperiosteally exposed the vertebral bodies from C5-C6.  Caspar pins were  then placed into the C5 and C6 vertebral bodies and distraction was applied.  A thorough and complete C5-6 intervertebral diskectomy was performed.  The posterior longitudinal ligament was identified and entered using a nerve hook.  I then used #1 followed by #2 Kerrison to perform a thorough and complete intervertebral diskectomy.  The spinal canal was thoroughly decompressed, as was the left neuroforamen.  The endplates were then prepared and the appropriate-sized intervertebral spacer was then packed with ViviGen and tamped into position in the usual fashion. The Caspar pins  then were removed and bone wax was placed in their place.  The appropriate-sized anterior cervical plate was placed over the anterior spine.  12 mm variable angle screws were placed, 2 in each vertebral body from C5-C6 for a total of 4 vertebral body screws.  The screws were then locked to the plate using the Cam locking mechanism.  I was very pleased with the final fluoroscopic images.  The wound was then irrigated.  The wound was then explored for any undue bleeding and there was no bleeding noted. The wound was then closed in layers using 2-0 Vicryl, followed by 4-0 Monocryl.  Benzoin and Steri-Strips were applied, followed by sterile dressing.  All instrument counts were correct at the termination of the procedure.   Of note, Pricilla Holm, PA-C, was my assistant throughout surgery, and did aid in retraction, placement of the hardware, suctioning, and closure from start to finish.  Phylliss Bob, MD

## 2020-05-19 NOTE — H&P (Signed)
PREOPERATIVE H&P  Chief Complaint: Left shoulder and left arm pain  HPI: Laura Steele is a 45 y.o. female who presents with ongoing pain in the left shoulder and arm  MRI reveals severe left NF stenosis at C5/6  Patient has failed multiple forms of conservative care and continues to have pain (see office notes for additional details regarding the patient's full course of treatment)  Past Medical History:  Diagnosis Date  . Allergy   . Anxiety   . Asthma   . Headache    Migraines  . Hypoglycemia   . Hypothyroidism   . Pneumonia    Past Surgical History:  Procedure Laterality Date  . ABDOMINAL HYSTERECTOMY    . ANTERIOR CERVICAL DISCECTOMY  03/2017   C6-7; Dr. Christella Noa   Social History   Socioeconomic History  . Marital status: Married    Spouse name: Not on file  . Number of children: Not on file  . Years of education: Not on file  . Highest education level: Not on file  Occupational History  . Not on file  Tobacco Use  . Smoking status: Current Every Day Smoker    Packs/day: 1.00    Types: Cigarettes  . Smokeless tobacco: Never Used  Vaping Use  . Vaping Use: Never used  Substance and Sexual Activity  . Alcohol use: Yes    Comment: Pt states was drinkning 40 0z of beer a night. but stopped since sunday.  . Drug use: No  . Sexual activity: Yes  Other Topics Concern  . Not on file  Social History Narrative  . Not on file   Social Determinants of Health   Financial Resource Strain: Not on file  Food Insecurity: Not on file  Transportation Needs: Not on file  Physical Activity: Not on file  Stress: Not on file  Social Connections: Not on file   Family History  Problem Relation Age of Onset  . Diabetes Mother   . Breast cancer Mother        had masectomy.   . Skin cancer Maternal Grandfather    No Known Allergies Prior to Admission medications   Medication Sig Start Date End Date Taking? Authorizing Provider  albuterol (VENTOLIN HFA) 108  (90 Base) MCG/ACT inhaler Inhale 1-2 puffs into the lungs daily as needed for wheezing or shortness of breath. 09/26/19  Yes Kuneff, Renee A, DO  baclofen (LIORESAL) 10 MG tablet TAKE 1/2 TABLET BY MOUTH 3 TIMES DAILY AS NEEDED Patient taking differently: Take 5 mg by mouth 3 (three) times daily as needed for muscle spasms. 04/06/20  Yes Kuneff, Renee A, DO  DULoxetine (CYMBALTA) 60 MG capsule TAKE 1 CAPSULE BY MOUTH EVERY DAY Patient taking differently: Take 60 mg by mouth daily. 02/16/20  Yes Silverio Decamp, MD  glycopyrrolate (ROBINUL) 1 MG tablet Take 1 tablet (1 mg total) by mouth daily. 04/06/20  Yes Kuneff, Renee A, DO  hydrOXYzine (ATARAX/VISTARIL) 25 MG tablet Take 1 tablet (25 mg total) by mouth at bedtime. 04/06/20  Yes Kuneff, Renee A, DO  levothyroxine (SYNTHROID) 25 MCG tablet Take 1 tablet (25 mcg total) by mouth daily before breakfast. 03/30/20  Yes Kuneff, Renee A, DO  meloxicam (MOBIC) 15 MG tablet TAKE 1 TABLET BY MOUTH IN THE MORNING WITH BREAKFAST FOR 2 WEEKS, THEN DAILY AS NEEDED FOR PAIN Patient taking differently: Take 15 mg by mouth daily as needed for pain. 08/15/19  Yes Silverio Decamp, MD  nicotine polacrilex ([facility] NICOTINE)  4 MG gum Take 1 each (4 mg total) by mouth as needed for smoking cessation. 04/06/20  Yes Kuneff, Renee A, DO  B Complex-C (B-COMPLEX WITH VITAMIN C) tablet Take 1 tablet by mouth daily. Patient not taking: Reported on 05/12/2020    [provider]  buPROPion (WELLBUTRIN SR) 150 MG 12 hr tablet Take 1 tablet (150 mg total) by mouth 2 (two) times daily. Patient not taking: Reported on 05/12/2020 04/06/20   Howard Pouch A, DO  cetirizine (ZYRTEC) 10 MG tablet Take 1 tablet (10 mg total) by mouth daily. Patient not taking: Reported on 05/12/2020 09/26/19   Howard Pouch A, DO  gabapentin (NEURONTIN) 300 MG capsule 1 capsule in the morning, 2 capsules at night Patient not taking: Reported on 05/12/2020 03/09/20   Silverio Decamp, MD   nicotine (NICODERM CQ - DOSED IN MG/24 HR) 7 mg/24hr patch Place 1 patch (7 mg total) onto the skin daily. Patient not taking: Reported on 05/12/2020 04/06/20   Howard Pouch A, DO     All other systems have been reviewed and were otherwise negative with the exception of those mentioned in the HPI and as above.  Physical Exam: There were no vitals filed for this visit.  There is no height or weight on file to calculate BMI.  General: Alert, no acute distress Cardiovascular: No pedal edema Respiratory: No cyanosis, no use of accessory musculature Skin: No lesions in the area of chief complaint Neurologic: Sensation intact distally Psychiatric: Patient is competent for consent with normal mood and affect Lymphatic: No axillary or cervical lymphadenopathy   Assessment/Plan: LEFT CERVICAL 6 RADICULOPATHY Plan for Procedure(s): ANTERIOR CERVICAL DECOMPRESSION FUSION CERVICAL 5 - CERVICAL 6 WITH INSTRUMENTATION AND ALLOGRAFT   Norva Karvonen, MD 05/19/2020 12:36 PM

## 2020-05-19 NOTE — Transfer of Care (Signed)
Immediate Anesthesia Transfer of Care Note  Patient: Engineer, maintenance (IT)  Procedure(s) Performed: ANTERIOR CERVICAL DECOMPRESSION FUSION CERVICAL 5 - CERVICAL 6 WITH INSTRUMENTATION AND ALLOGRAFT (N/A Neck)  Patient Location: PACU  Anesthesia Type:General  Level of Consciousness: awake, alert  and oriented  Airway & Oxygen Therapy: Patient Spontanous Breathing  Post-op Assessment: Report given to RN and Post -op Vital signs reviewed and stable  Post vital signs: Reviewed and stable138/  Last Vitals:  Vitals Value Taken Time  BP 138/107   Temp    Pulse 123 05/19/20 1717  Resp 27 05/19/20 1717  SpO2 95 % 05/19/20 1717  Vitals shown include unvalidated device data.  Last Pain:  Vitals:   05/19/20 1253  TempSrc: Oral  PainSc:       Patients Stated Pain Goal: 5 (73/73/66 8159)  Complications: No complications documented.

## 2020-05-20 ENCOUNTER — Encounter (HOSPITAL_COMMUNITY): Payer: Self-pay | Admitting: Orthopedic Surgery

## 2020-07-08 ENCOUNTER — Other Ambulatory Visit: Payer: Self-pay | Admitting: Family Medicine

## 2020-08-01 ENCOUNTER — Encounter: Payer: Self-pay | Admitting: Family Medicine

## 2020-08-02 ENCOUNTER — Telehealth (INDEPENDENT_AMBULATORY_CARE_PROVIDER_SITE_OTHER): Payer: 59 | Admitting: Family Medicine

## 2020-08-02 ENCOUNTER — Other Ambulatory Visit: Payer: Self-pay

## 2020-08-02 ENCOUNTER — Ambulatory Visit: Payer: 59

## 2020-08-02 ENCOUNTER — Encounter: Payer: Self-pay | Admitting: Family Medicine

## 2020-08-02 DIAGNOSIS — Z20822 Contact with and (suspected) exposure to covid-19: Secondary | ICD-10-CM

## 2020-08-02 DIAGNOSIS — R509 Fever, unspecified: Secondary | ICD-10-CM

## 2020-08-02 DIAGNOSIS — R059 Cough, unspecified: Secondary | ICD-10-CM

## 2020-08-02 LAB — POCT INFLUENZA A/B
Influenza A, POC: NEGATIVE
Influenza B, POC: NEGATIVE

## 2020-08-02 MED ORDER — AZITHROMYCIN 250 MG PO TABS: ORAL_TABLET | ORAL | 0 refills | Status: AC

## 2020-08-02 MED ORDER — ONDANSETRON HCL 4 MG PO TABS
4.0000 mg | ORAL_TABLET | Freq: Three times a day (TID) | ORAL | 0 refills | Status: DC | PRN
Start: 2020-08-02 — End: 2020-10-11

## 2020-08-02 MED ORDER — PREDNISONE 20 MG PO TABS: ORAL_TABLET | ORAL | 0 refills | Status: DC

## 2020-08-02 NOTE — Patient Instructions (Signed)

## 2020-08-02 NOTE — Addendum Note (Signed)
Addended by: Tora Kindred on: 08/02/2020 04:32 PM   Modules accepted: Orders

## 2020-08-02 NOTE — Progress Notes (Signed)
VIRTUAL VISIT VIA VIDEO  I connected with Laura Steele on 08/02/20 at  3:30 PM EDT by a video enabled telemedicine application and verified that I am speaking with the correct person using two identifiers. Location patient: Home Location provider: [facility], Office Persons participating in the virtual visit: Patient, Dr. Raoul Pitch and Cyndra Numbers, CMA  I discussed the limitations of evaluation and management by telemedicine and the availability of in person appointments. The patient expressed understanding and agreed to proceed.   Laura Steele , 1975-06-22, 45 y.o., female MRN: [id number] Patient Care Team    Relationship Specialty Notifications Start End  Ma Hillock, DO PCP - General Family Medicine  10/22/15   Ashok Pall, MD Consulting Physician Neurosurgery  06/22/17   Silverio Decamp, MD Consulting Physician Sports Medicine  06/22/17     Chief Complaint  Patient presents with  . Emesis  . Fever  . no taste    Has not taken Covid test, symptoms since 07/31/20  . Ear Fullness     Subjective: Pt presents for an OV with complaints of nausea, vomit, fever, decreased sense of taste and ear fullness of 2 days duration.  She reports she is tolerating p.o. although she is having difficulty eating anything heavy.  She is able to drink and keep down fluids.[place]  She has been taking Tylenol and hydrating with Gatorade and ginger ale. She is an everyday smoker.  She has had 2 Moderna vaccines last greater than 1 year ago without a booster. She was exposed to COVID through work exposure.   Depression screen [facility] 2/9 04/06/2020 10/24/2017 06/22/2017 12/18/2016 12/04/2016  Decreased Interest 0 0 1 0 -  Down, Depressed, Hopeless 2 0 0 0 0  PHQ - 2 Score 2 0 1 0 0  Altered sleeping 3 - 2 - 0  Tired, decreased energy 3 - 3 - 0  Change in appetite 3 - 2 - 0  Feeling bad or failure about yourself  0 - 0 - 0  Trouble concentrating 0 - 0 - 0  Moving slowly or  fidgety/restless 0 - 0 - 0  Suicidal thoughts 0 - 0 - 0  PHQ-9 Score 11 - 8 - 0  Difficult doing work/chores - - Somewhat difficult - -    No Known Allergies Social History   Social History Narrative  . Not on file   Past Medical History:  Diagnosis Date  . Allergy   . Anxiety   . Asthma   . Headache    Migraines  . Hypoglycemia   . Hypothyroidism   . Pneumonia    Past Surgical History:  Procedure Laterality Date  . ABDOMINAL HYSTERECTOMY    . ANTERIOR CERVICAL DECOMP/DISCECTOMY FUSION N/A 05/19/2020   Procedure: ANTERIOR CERVICAL DECOMPRESSION FUSION CERVICAL 5 - CERVICAL 6 WITH INSTRUMENTATION AND ALLOGRAFT;  Surgeon: Phylliss Bob, MD;  Location: [facility];  Service: Orthopedics;  Laterality: N/A;  . ANTERIOR CERVICAL DISCECTOMY  03/2017   C6-7; Dr. Christella Noa   Family History  Problem Relation Age of Onset  . Diabetes Mother   . Breast cancer Mother        had masectomy.   . Skin cancer Maternal Grandfather    Allergies as of 08/02/2020   No Known Allergies     Medication List       Accurate as of Aug 02, 2020  4:22 PM. If you have any questions, ask your nurse or doctor.  STOP taking these medications   gabapentin 300 MG capsule Commonly known as: NEURONTIN Stopped by: Howard Pouch, DO   nicotine polacrilex 4 MG gum Commonly known as: [facility] Nicotine Stopped by: Howard Pouch, DO     TAKE these medications   albuterol 108 (90 Base) MCG/ACT inhaler Commonly known as: VENTOLIN HFA Inhale 1-2 puffs into the lungs daily as needed for wheezing or shortness of breath.   azithromycin 250 MG tablet Commonly known as: ZITHROMAX Take 2 tablets on day 1, then 1 tablet daily on days 2 through 5 Started by: Howard Pouch, DO   buPROPion 150 MG 12 hr tablet Commonly known as: WELLBUTRIN SR Take by mouth.   chlordiazePOXIDE 25 MG capsule Commonly known as: LIBRIUM TAKE ONE CAPSULE (25 MG DOSE) BY MOUTH 3 (THREE) TIMES A DAY AS NEEDED (SHAKING) FOR UP TO 4  DAYS.   DULoxetine 60 MG capsule Commonly known as: CYMBALTA TAKE 1 CAPSULE BY MOUTH EVERY DAY What changed: how much to take   glycopyrrolate 1 MG tablet Commonly known as: Robinul Take 1 tablet (1 mg total) by mouth daily.   HYDROcodone-acetaminophen 5-325 MG tablet Commonly known as: NORCO/VICODIN Take 1 tablet by mouth every 6 (six) hours as needed for moderate pain or severe pain.   hydrOXYzine 25 MG tablet Commonly known as: ATARAX/VISTARIL Take 1 tablet (25 mg total) by mouth at bedtime.   levothyroxine 25 MCG tablet Commonly known as: SYNTHROID Take 1 tablet (25 mcg total) by mouth daily before breakfast.   meloxicam 15 MG tablet Commonly known as: MOBIC TAKE 1 TABLET BY MOUTH IN THE MORNING WITH BREAKFAST FOR 2 WEEKS, THEN DAILY AS NEEDED FOR PAIN   methocarbamol 500 MG tablet Commonly known as: Robaxin Take 1 tablet (500 mg total) by mouth every 6 (six) hours as needed for muscle spasms.   ondansetron 4 MG tablet Commonly known as: ZOFRAN Take 1 tablet (4 mg total) by mouth every 8 (eight) hours as needed for nausea or vomiting. Started by: Howard Pouch, DO   ondansetron 8 MG disintegrating tablet Commonly known as: ZOFRAN-ODT DISSOLVE 1 TABLET IN MOUTH EVERY 8 HOURS AS NEEDED FOR UP TO 7 DAYS   pantoprazole 20 MG tablet Commonly known as: PROTONIX Take 20 mg by mouth daily.   predniSONE 20 MG tablet Commonly known as: DELTASONE 60 mg x3d, 40 mg x3d, 20 mg x2d, 10 mg x2d Started by: Howard Pouch, DO       All past medical history, surgical history, allergies, family history, immunizations andmedications were updated in the EMR today and reviewed under the history and medication portions of their EMR.     ROS: Negative, with the exception of above mentioned in HPI   Objective:  There were no vitals taken for this visit. There is no height or weight on file to calculate BMI. Gen: Afebrile. No acute distress. Nontoxic in appearance, well developed,  well nourished.  HENT: AT. [place].  Eyes:Pupils Equal Round Reactive to light, Extraocular movements intact,  Conjunctiva without redness, discharge or icterus. CV:  Chest: Coughing present on exam.  No shortness of breath present.  Hoarseness present. Neuro: Alert. Oriented x3  Psych: Normal affect, dress and demeanor. Normal speech. Normal thought content and judgment.  No exam data present No results found. No results found for this or any previous visit (from the past 24 hour(s)).  Assessment/Plan: Shaquna Geigle is a 45 y.o. female present for OV for  1Cough/fever/close exposure to [place], hydrate.  -  POCT Influenza A/B - Novel Coronavirus, NAA ([facility]) +/- flonase, mucinex (DM if cough), nettie pot or nasal saline.  Zofran, prednisone and Z-Pak prescribed, take until completed.  If cough present it can last up to 6-8 weeks.  F/U 2 weeks of not improved.    Reviewed expectations re: course of current medical issues.  Discussed self-management of symptoms.  Outlined signs and symptoms indicating need for more acute intervention.  Patient verbalized understanding and all questions were answered.  Patient received an After-Visit Summary.    Orders Placed This Encounter  Procedures  . Novel Coronavirus, NAA ([facility])  . POCT Influenza A/B   Meds ordered this encounter  Medications  . ondansetron (ZOFRAN) 4 MG tablet    Sig: Take 1 tablet (4 mg total) by mouth every 8 (eight) hours as needed for nausea or vomiting.    Dispense:  20 tablet    Refill:  0  . predniSONE (DELTASONE) 20 MG tablet    Sig: 60 mg x3d, 40 mg x3d, 20 mg x2d, 10 mg x2d    Dispense:  18 tablet    Refill:  0  . azithromycin (ZITHROMAX) 250 MG tablet    Sig: Take 2 tablets on day 1, then 1 tablet daily on days 2 through 5    Dispense:  6 tablet    Refill:  0   Referral Orders  No referral(s) requested today     Note is dictated utilizing voice recognition software. Although note has  been proof read prior to signing, occasional typographical errors still can be missed. If any questions arise, please do not hesitate to call for verification.   electronically signed by:  Howard Pouch, DO  [place]

## 2020-08-03 LAB — SARS-COV-2, NAA 2 DAY TAT

## 2020-08-03 LAB — NOVEL CORONAVIRUS, NAA: SARS-CoV-2, NAA: NOT DETECTED

## 2020-08-23 ENCOUNTER — Other Ambulatory Visit: Payer: Self-pay | Admitting: Sports Medicine

## 2020-08-23 DIAGNOSIS — M25512 Pain in left shoulder: Secondary | ICD-10-CM

## 2020-09-10 ENCOUNTER — Ambulatory Visit: Payer: 59 | Admitting: Family Medicine

## 2020-09-10 DIAGNOSIS — Z0289 Encounter for other administrative examinations: Secondary | ICD-10-CM

## 2020-10-07 ENCOUNTER — Other Ambulatory Visit: Payer: Self-pay

## 2020-10-07 MED ORDER — HYDROXYZINE HCL 25 MG PO TABS
25.0000 mg | ORAL_TABLET | Freq: Every day | ORAL | 0 refills | Status: DC
Start: 2020-10-07 — End: 2020-10-11

## 2020-10-11 ENCOUNTER — Ambulatory Visit (INDEPENDENT_AMBULATORY_CARE_PROVIDER_SITE_OTHER): Payer: 59 | Admitting: Family Medicine

## 2020-10-11 ENCOUNTER — Other Ambulatory Visit: Payer: Self-pay

## 2020-10-11 ENCOUNTER — Encounter: Payer: Self-pay | Admitting: Family Medicine

## 2020-10-11 VITALS — BP 119/80 | HR 84 | Temp 98.4°F | Ht 63.0 in | Wt 150.0 lb

## 2020-10-11 DIAGNOSIS — Z9189 Other specified personal risk factors, not elsewhere classified: Secondary | ICD-10-CM

## 2020-10-11 DIAGNOSIS — E039 Hypothyroidism, unspecified: Secondary | ICD-10-CM | POA: Diagnosis not present

## 2020-10-11 DIAGNOSIS — L74510 Primary focal hyperhidrosis, axilla: Secondary | ICD-10-CM | POA: Diagnosis not present

## 2020-10-11 DIAGNOSIS — F419 Anxiety disorder, unspecified: Secondary | ICD-10-CM

## 2020-10-11 DIAGNOSIS — F32A Depression, unspecified: Secondary | ICD-10-CM

## 2020-10-11 DIAGNOSIS — E538 Deficiency of other specified B group vitamins: Secondary | ICD-10-CM

## 2020-10-11 DIAGNOSIS — M5030 Other cervical disc degeneration, unspecified cervical region: Secondary | ICD-10-CM

## 2020-10-11 DIAGNOSIS — D582 Other hemoglobinopathies: Secondary | ICD-10-CM

## 2020-10-11 DIAGNOSIS — F5101 Primary insomnia: Secondary | ICD-10-CM

## 2020-10-11 DIAGNOSIS — R911 Solitary pulmonary nodule: Secondary | ICD-10-CM

## 2020-10-11 DIAGNOSIS — R918 Other nonspecific abnormal finding of lung field: Secondary | ICD-10-CM | POA: Insufficient documentation

## 2020-10-11 DIAGNOSIS — E559 Vitamin D deficiency, unspecified: Secondary | ICD-10-CM | POA: Insufficient documentation

## 2020-10-11 MED ORDER — PREGABALIN 75 MG PO CAPS: 75.0000 mg | ORAL_CAPSULE | Freq: Two times a day (BID) | ORAL | 5 refills | Status: DC

## 2020-10-11 MED ORDER — HYDROXYZINE HCL 25 MG PO TABS: 25.0000 mg | ORAL_TABLET | Freq: Every day | ORAL | 1 refills | Status: DC

## 2020-10-11 MED ORDER — DICLOFENAC SODIUM ER 100 MG PO TB24: 100.0000 mg | ORAL_TABLET | Freq: Every day | ORAL | 1 refills | Status: DC

## 2020-10-11 MED ORDER — DULOXETINE HCL 60 MG PO CPEP: ORAL_CAPSULE | ORAL | 1 refills | Status: DC

## 2020-10-11 MED ORDER — GLYCOPYRROLATE 1 MG PO TABS
1.0000 mg | ORAL_TABLET | Freq: Every day | ORAL | 1 refills | Status: DC
Start: 2020-10-11 — End: 2021-02-07

## 2020-10-11 NOTE — Progress Notes (Signed)
Laura Steele , February 06, 1976, 45 y.o., female MRN: [id number] Patient Care Team    Relationship Specialty Notifications Start End  Ma Hillock, DO PCP - General Family Medicine  10/22/15   Ashok Pall, MD Consulting Physician Neurosurgery  06/22/17   Silverio Decamp, MD Consulting Physician Sports Medicine  06/22/17     Chief Complaint  Patient presents with   Anxiety    Pt c/o x irritability 1 mo;      Subjective: Klani Steele is a 45 y.o. 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  Hypothyroidism:  Patient reports compliance  with levothyroxine 25 mcg daily. She denies side effects.    Anxiety/Insomnia/chronic back pain:  She reports she is more moody lately. She has been taking the Cymbalta. She takes the vistaril for sleep. She is taking gabapentin 600 mg QHS.    Depression screen [facility] 2/9 10/11/2020 04/06/2020 10/24/2017 06/22/2017 12/18/2016  Decreased Interest 1 0 0 1 0  Down, Depressed, Hopeless 0 2 0 0 0  PHQ - 2 Score 1 2 0 1 0  Altered sleeping 2 3 - 2 -  Tired, decreased energy 2 3 - 3 -  Change in appetite 2 3 - 2 -  Feeling bad or failure about yourself  0 0 - 0 -  Trouble concentrating 1 0 - 0 -  Moving slowly or fidgety/restless 1 0 - 0 -  Suicidal thoughts 0 0 - 0 -  PHQ-9 Score 9 11 - 8 -  Difficult doing work/chores - - - Somewhat difficult -   GAD 7 : Generalized Anxiety Score 10/11/2020 04/06/2020 09/26/2019 03/25/2019  Nervous, Anxious, on Edge 2 0 1 0  Control/stop worrying 1 0 1 0  Worry too much - different things 2 0 0 0  Trouble relaxing 2 3 0 3  Restless 2 0 0 0  Easily annoyed or irritable 3 0 0 0  Afraid - awful might happen 0 0 0 0  Total GAD 7 Score 12 3 2 3   Anxiety Difficulty - - Not difficult at all Not difficult at all    No Known Allergies Social History   Tobacco Use   Smoking status: Every Day    Packs/day: 1.00    Types: Cigarettes    Smokeless tobacco: Never  Substance Use Topics   Alcohol use: Yes    Comment: Pt states was drinkning 40 0z of beer a night. but stopped since sunday.   Past Medical History:  Diagnosis Date   Allergy    Anxiety    Asthma    Headache    Migraines   Hypoglycemia    Hypothyroidism    Pneumonia    Past Surgical History:  Procedure Laterality Date   ABDOMINAL HYSTERECTOMY     ANTERIOR CERVICAL DECOMP/DISCECTOMY FUSION N/A 05/19/2020   Procedure: ANTERIOR CERVICAL DECOMPRESSION FUSION CERVICAL 5 - CERVICAL 6 WITH INSTRUMENTATION AND ALLOGRAFT;  Surgeon: Phylliss Bob, MD;  Location: [place];  Service: Orthopedics;  Laterality: N/A;   ANTERIOR CERVICAL DISCECTOMY  03/2017   C6-7; Dr. Christella Noa   Family History  Problem Relation Age of Onset   Diabetes Mother    Breast cancer Mother        had masectomy.    Skin cancer Maternal Grandfather    Allergies as of 10/11/2020   No Known Allergies      Medication List  Accurate as of October 11, 2020  2:52 PM. If you have any questions, ask your nurse or doctor.          STOP taking these medications    baclofen 10 MG tablet Commonly known as: LIORESAL Stopped by: Howard Pouch, DO   buPROPion 150 MG 12 hr tablet Commonly known as: WELLBUTRIN SR Stopped by: Howard Pouch, DO   chlordiazePOXIDE 25 MG capsule Commonly known as: LIBRIUM Stopped by: Howard Pouch, DO   gabapentin 300 MG capsule Commonly known as: NEURONTIN Stopped by: Howard Pouch, DO   HYDROcodone-acetaminophen 5-325 MG tablet Commonly known as: NORCO/VICODIN Stopped by: Howard Pouch, DO   meloxicam 15 MG tablet Commonly known as: MOBIC Stopped by: Howard Pouch, DO   methocarbamol 500 MG tablet Commonly known as: Robaxin Stopped by: Howard Pouch, DO   ondansetron 4 MG tablet Commonly known as: ZOFRAN Stopped by: Howard Pouch, DO   ondansetron 8 MG disintegrating tablet Commonly known as: ZOFRAN-ODT Stopped by: Howard Pouch, DO   predniSONE  20 MG tablet Commonly known as: DELTASONE Stopped by: Howard Pouch, DO       TAKE these medications    albuterol 108 (90 Base) MCG/ACT inhaler Commonly known as: VENTOLIN HFA Inhale 1-2 puffs into the lungs daily as needed for wheezing or shortness of breath.   Diclofenac Sodium CR 100 MG 24 hr tablet Take 1 tablet (100 mg total) by mouth daily. Started by: Howard Pouch, DO   DULoxetine 60 MG capsule Commonly known as: CYMBALTA TAKE 1 CAPSULE BY MOUTH BID What changed:  how much to take how to take this when to take this additional instructions Changed by: Howard Pouch, DO   glycopyrrolate 1 MG tablet Commonly known as: Robinul Take 1 tablet (1 mg total) by mouth daily.   hydrOXYzine 25 MG tablet Commonly known as: ATARAX/VISTARIL Take 1 tablet (25 mg total) by mouth at bedtime.   levothyroxine 25 MCG tablet Commonly known as: SYNTHROID Take 1 tablet (25 mcg total) by mouth daily before breakfast.   pantoprazole 20 MG tablet Commonly known as: PROTONIX Take 20 mg by mouth daily.   pregabalin 75 MG capsule Commonly known as: Lyrica Take 1 capsule (75 mg total) by mouth 2 (two) times daily. Started by: Howard Pouch, DO        All past medical history, surgical history, allergies, family history, immunizations andmedications were updated in the EMR today and reviewed under the history and medication portions of their EMR.     ROS: Negative, with the exception of above mentioned in HPI   Objective:  BP 119/80   Pulse 84   Temp 98.4 F (36.9 C) (Oral)   Ht 5\' 3"  (1.6 m)   Wt 150 lb (68 kg)   SpO2 98%   BMI 26.57 kg/m  Body mass index is 26.57 kg/m. Gen: Afebrile. No acute distress. Nontoxic, pleasant female.  HENT: AT. [place].  Eyes:Pupils Equal Round Reactive to light, Extraocular movements intact,  Conjunctiva without redness, discharge or icterus. Neck/lymp/endocrine: Supple,no lymphadenopathy, no thyromegaly CV: RRR no murmur, no edema, +2/4 P  posterior tibialis pulses Chest: CTAB, no wheeze or crackles Skin: no rashes, purpura or petechiae.  Neuro:  Normal gait. PERLA. EOMi. Alert. Oriented x3 Psych: Normal affect, dress and demeanor. Normal speech. Normal thought content and judgment.    No results found. No results found. No results found for this or any previous visit (from the past 24 hour(s)).  Assessment/Plan: Paizlee Kinder is a 45 y.o.  female present for OV for  Hypothyroidism -continue  levothyroxine 25 mcg daily.  > refills after labs.  - TSH collected today.   Vit d def/b12 de/folate - encouraged her to take a B-complex vitamin.  Continue vit d 5000k otc qd with food Vit d, b12 and folate collected today.    Anxiety/insomnia/chronic neck pain:  Could use more coverage.  Increase  Cymbalta 60 bid Continue vistaril 25 mg QHS DC gaba, start Lyrica 75 mg BID.  Could consider BB  F/u 6 mos.    Hyperhidrosis of axilla Stable.  Continue Robinul  Elevated hemoglobin (HCC) - CBC w/Diff Abnormal findings on diagnostic imaging of lung/Pulmonary nodule less than 1 cm in diameter with moderate to high risk for malignant neoplasm/Ground glass opacity present on imaging of lung Daily smoker. Not ready to quite. CT chest lung nodule follow due.  - CT Chest Wo Contrast; Future   Reviewed expectations re: course of current medical issues. Discussed self-management of symptoms. Outlined signs and symptoms indicating need for more acute intervention. Patient verbalized understanding and all questions were answered. Patient received an After-Visit Summary.    Orders Placed This Encounter  Procedures   CBC w/Diff   TSH   Basic Metabolic Panel (BMET)   Vitamin D (25 hydroxy)   B12 and Folate Panel    Meds ordered this encounter  Medications   DULoxetine (CYMBALTA) 60 MG capsule    Sig: TAKE 1 CAPSULE BY MOUTH BID    Dispense:  180 capsule    Refill:  1   hydrOXYzine (ATARAX/VISTARIL) 25 MG tablet     Sig: Take 1 tablet (25 mg total) by mouth at bedtime.    Dispense:  90 tablet    Refill:  1   glycopyrrolate (ROBINUL) 1 MG tablet    Sig: Take 1 tablet (1 mg total) by mouth daily.    Dispense:  90 tablet    Refill:  1   Diclofenac Sodium CR 100 MG 24 hr tablet    Sig: Take 1 tablet (100 mg total) by mouth daily.    Dispense:  90 tablet    Refill:  1   pregabalin (LYRICA) 75 MG capsule    Sig: Take 1 capsule (75 mg total) by mouth 2 (two) times daily.    Dispense:  60 capsule    Refill:  5       Note is dictated utilizing voice recognition software. Although note has been proof read prior to signing, occasional typographical errors still can be missed. If any questions arise, please do not hesitate to call for verification.   electronically signed by:  Howard Pouch, DO  [place]

## 2020-10-12 LAB — CBC WITH DIFFERENTIAL/PLATELET
Absolute Monocytes: 1242 cells/uL — ABNORMAL HIGH (ref 200–950)
Basophils Absolute: 97 cells/uL (ref 0–200)
Basophils Relative: 0.9 %
Eosinophils Absolute: 194 cells/uL (ref 15–500)
Eosinophils Relative: 1.8 %
HCT: 44.8 % (ref 35.0–45.0)
Hemoglobin: 14.4 g/dL (ref 11.7–15.5)
Lymphs Abs: 2452 cells/uL (ref 850–3900)
MCH: 31.7 pg (ref 27.0–33.0)
MCHC: 32.1 g/dL (ref 32.0–36.0)
MCV: 98.7 fL (ref 80.0–100.0)
MPV: 10.5 fL (ref 7.5–12.5)
Monocytes Relative: 11.5 %
Neutro Abs: 6815 cells/uL (ref 1500–7800)
Neutrophils Relative %: 63.1 %
Platelets: 245 10*3/uL (ref 140–400)
RBC: 4.54 10*6/uL (ref 3.80–5.10)
RDW: 13.5 % (ref 11.0–15.0)
Total Lymphocyte: 22.7 %
WBC: 10.8 10*3/uL (ref 3.8–10.8)

## 2020-10-12 LAB — B12 AND FOLATE PANEL
Folate: 3.7 ng/mL — ABNORMAL LOW
Vitamin B-12: 240 pg/mL (ref 200–1100)

## 2020-10-12 LAB — BASIC METABOLIC PANEL WITH GFR
BUN: 10 mg/dL (ref 7–25)
CO2: 24 mmol/L (ref 20–32)
Calcium: 8.8 mg/dL (ref 8.6–10.2)
Chloride: 104 mmol/L (ref 98–110)
Creat: 0.6 mg/dL (ref 0.50–0.99)
Glucose, Bld: 69 mg/dL (ref 65–99)
Potassium: 4 mmol/L (ref 3.5–5.3)
Sodium: 136 mmol/L (ref 135–146)

## 2020-10-12 LAB — VITAMIN D 25 HYDROXY (VIT D DEFICIENCY, FRACTURES): Vit D, 25-Hydroxy: 24 ng/mL — ABNORMAL LOW (ref 30–100)

## 2020-10-12 LAB — TSH: TSH: 5.61 m[IU]/L — ABNORMAL HIGH

## 2020-10-13 ENCOUNTER — Telehealth: Payer: Self-pay | Admitting: Family Medicine

## 2020-10-13 DIAGNOSIS — E039 Hypothyroidism, unspecified: Secondary | ICD-10-CM

## 2020-10-13 MED ORDER — LEVOTHYROXINE SODIUM 50 MCG PO TABS
50.0000 ug | ORAL_TABLET | Freq: Every day | ORAL | 1 refills | Status: DC
Start: 2020-10-13 — End: 2021-02-07

## 2020-10-13 MED ORDER — VITAMIN D3 50 MCG (2000 UT) PO CAPS: 2000.0000 [IU] | ORAL_CAPSULE | Freq: Every day | ORAL | 3 refills | Status: DC

## 2020-10-13 NOTE — Telephone Encounter (Signed)
Spoke with pt regarding labs and instructions.   

## 2020-10-13 NOTE — Telephone Encounter (Signed)
  Please call patient Kidney function and electrolytes are normal. Blood cell counts and electrolytes are normal Folate, B12 and vitamin D levels are all low.   -Vitamin D supplement encouraged.  I have called in a daily vitamin D for 2000 units daily.    -her B vitamins need to be replaced.  I encouraged her to pick up a B complex supplement.  This will have both the folate and the B12 in it. Thyroid levels are efficient.  I have called in a new thyroid dose for her.  It is now levothyroxine 50 mcg daily on an empty stomach.  Follow-up in 8 weeks for lab appointment only to have thyroid levels rechecked on this dosage.

## 2020-10-15 ENCOUNTER — Encounter: Payer: Self-pay | Admitting: Family Medicine

## 2020-10-25 ENCOUNTER — Telehealth: Payer: Self-pay | Admitting: Family Medicine

## 2020-10-25 MED ORDER — ALBUTEROL SULFATE HFA 108 (90 BASE) MCG/ACT IN AERS: 1.0000 | INHALATION_SPRAY | Freq: Every day | RESPIRATORY_TRACT | 5 refills | Status: DC | PRN

## 2020-10-25 NOTE — Telephone Encounter (Signed)
Rx sent 

## 2020-10-25 NOTE — Telephone Encounter (Signed)
LVmM to inform pt that med was already sent to pharmacy last month and to contact pharmacy.

## 2020-10-25 NOTE — Telephone Encounter (Signed)
Patient called back for albuterol refill. Last was sent in July of 2021.

## 2020-10-25 NOTE — Telephone Encounter (Signed)
Patient requesting refill of albuterol. Please send to same [facility] in [place].

## 2020-10-28 ENCOUNTER — Other Ambulatory Visit: Payer: 59

## 2020-11-10 ENCOUNTER — Other Ambulatory Visit: Payer: 59

## 2020-11-18 ENCOUNTER — Ambulatory Visit (INDEPENDENT_AMBULATORY_CARE_PROVIDER_SITE_OTHER): Payer: Self-pay | Admitting: Family Medicine

## 2020-11-18 ENCOUNTER — Encounter: Payer: Self-pay | Admitting: Family Medicine

## 2020-11-18 ENCOUNTER — Other Ambulatory Visit: Payer: Self-pay

## 2020-11-18 VITALS — BP 141/86 | HR 69 | Temp 98.0°F | Ht 63.0 in | Wt 149.0 lb

## 2020-11-18 DIAGNOSIS — M546 Pain in thoracic spine: Secondary | ICD-10-CM

## 2020-11-18 DIAGNOSIS — M62838 Other muscle spasm: Secondary | ICD-10-CM

## 2020-11-18 MED ORDER — TIZANIDINE HCL 2 MG PO CAPS: 2.0000 mg | ORAL_CAPSULE | Freq: Three times a day (TID) | ORAL | 5 refills | Status: DC | PRN

## 2020-11-18 NOTE — Progress Notes (Signed)
This visit occurred during the SARS-CoV-2 public health emergency.  Safety protocols were in place, including screening questions prior to the visit, additional usage of staff PPE, and extensive cleaning of exam room while observing appropriate contact time as indicated for disinfecting solutions.    Laura Steele , 1976/03/16, 45 y.o., female MRN: [id number] Patient Care Team    Relationship Specialty Notifications Start End  Ma Hillock, DO PCP - General Family Medicine  10/22/15   Ashok Pall, MD Consulting Physician Neurosurgery  06/22/17   Silverio Decamp, MD Consulting Physician Sports Medicine  06/22/17     Chief Complaint  Patient presents with   Back Pain    Pt c/o back pain that radiates to epigastric area x 3 weeks; pt has recently started a new job and worsen when at work     Subjective: Pt presents for an OV with complaints of thoracic back discomfort that can radiate around her upper abdomen.  She states she started a new job in which she is routinely reaching above her head.  Since the start of this new job she is experiencing back pain-of 3 weeks duration.  She reports she has been using a heating pad and soaking in warm water to help with the discomfort.  This does seem to decrease her pain.  Depression screen [facility] 2/9 10/11/2020 04/06/2020 10/24/2017 06/22/2017 12/18/2016  Decreased Interest 1 0 0 1 0  Down, Depressed, Hopeless 0 2 0 0 0  PHQ - 2 Score 1 2 0 1 0  Altered sleeping 2 3 - 2 -  Tired, decreased energy 2 3 - 3 -  Change in appetite 2 3 - 2 -  Feeling bad or failure about yourself  0 0 - 0 -  Trouble concentrating 1 0 - 0 -  Moving slowly or fidgety/restless 1 0 - 0 -  Suicidal thoughts 0 0 - 0 -  PHQ-9 Score 9 11 - 8 -  Difficult doing work/chores - - - Somewhat difficult -    No Known Allergies Social History   Social History Narrative   Not on file   Past Medical History:  Diagnosis Date   Allergy    Anxiety    Asthma     Headache    Migraines   Hypoglycemia    Hypothyroidism    Pneumonia    Past Surgical History:  Procedure Laterality Date   ABDOMINAL HYSTERECTOMY     ANTERIOR CERVICAL DECOMP/DISCECTOMY FUSION N/A 05/19/2020   Procedure: ANTERIOR CERVICAL DECOMPRESSION FUSION CERVICAL 5 - CERVICAL 6 WITH INSTRUMENTATION AND ALLOGRAFT;  Surgeon: Phylliss Bob, MD;  Location: [place];  Service: Orthopedics;  Laterality: N/A;   ANTERIOR CERVICAL DISCECTOMY  03/2017   C6-7; Dr. Christella Noa   Family History  Problem Relation Age of Onset   Diabetes Mother    Breast cancer Mother        had masectomy.    Skin cancer Maternal Grandfather    Allergies as of 11/18/2020   No Known Allergies      Medication List        Accurate as of November 18, 2020  1:05 PM. If you have any questions, ask your nurse or doctor.          albuterol 108 (90 Base) MCG/ACT inhaler Commonly known as: VENTOLIN HFA Inhale 1-2 puffs into the lungs daily as needed for wheezing or shortness of breath.   Diclofenac Sodium CR 100 MG 24 hr tablet Take 1  tablet (100 mg total) by mouth daily.   DULoxetine 60 MG capsule Commonly known as: CYMBALTA TAKE 1 CAPSULE BY MOUTH BID   glycopyrrolate 1 MG tablet Commonly known as: Robinul Take 1 tablet (1 mg total) by mouth daily.   hydrOXYzine 25 MG tablet Commonly known as: ATARAX/VISTARIL Take 1 tablet (25 mg total) by mouth at bedtime.   levothyroxine 50 MCG tablet Commonly known as: SYNTHROID Take 1 tablet (50 mcg total) by mouth daily before breakfast.   pantoprazole 20 MG tablet Commonly known as: PROTONIX Take 20 mg by mouth daily.   pregabalin 75 MG capsule Commonly known as: Lyrica Take 1 capsule (75 mg total) by mouth 2 (two) times daily.   tizanidine 2 MG capsule Commonly known as: ZANAFLEX Take 1 capsule (2 mg total) by mouth 3 (three) times daily as needed for muscle spasms. Started by: Howard Pouch, DO   Vitamin D3 50 MCG (2000 UT) capsule Take 1 capsule  (2,000 Units total) by mouth daily.        All past medical history, surgical history, allergies, family history, immunizations andmedications were updated in the EMR today and reviewed under the history and medication portions of their EMR.     ROS: Negative, with the exception of above mentioned in HPI   Objective:  BP (!) 141/86   Pulse 69   Temp 98 F (36.7 C) (Oral)   Ht '5\' 3"'$  (1.6 m)   Wt 149 lb (67.6 kg)   SpO2 100%   BMI 26.39 kg/m  Body mass index is 26.39 kg/m. Gen: Afebrile. No acute distress. Nontoxic in appearance, well developed, well nourished.  HENT: AT. [place].  MSK: Thoracic spine without erythema or soft tissue swelling.  No bony tenderness.  Tender to palpation bilaterally over T7-10 dermatomes.  Pain is reproducible with reaching above head.  Neurovascularly intact distally. Skin: no rashes, purpura or petechiae.  Neuro:  Normal gait. PERLA. EOMi. Alert. Oriented x3  Psych: Normal affect, dress and demeanor. Normal speech. Normal thought content and judgment.  No results found. No results found. No results found for this or any previous visit (from the past 24 hour(s)).  Assessment/Plan: Laura Steele is a 45 y.o. female present for OV for  Muscle spasm/Acute midline thoracic back pain Pain is reproducible and contributed to repetitive motion that she is now required to do for her new job.  Discussed back strengthening exercises. Continue to place heat application as needed. Zanaflex 3 times daily as needed prescribed. Advised patient she may want to pursue applying for a different job, since she is having to reach too far above her head to perform her daily duties, she may find this job unsuitable for her long-term.  Reviewed expectations re: course of current medical issues. Discussed self-management of symptoms. Outlined signs and symptoms indicating need for more acute intervention. Patient verbalized understanding and all questions were  answered. Patient received an After-Visit Summary.    No orders of the defined types were placed in this encounter.  Meds ordered this encounter  Medications   tizanidine (ZANAFLEX) 2 MG capsule    Sig: Take 1 capsule (2 mg total) by mouth 3 (three) times daily as needed for muscle spasms.    Dispense:  90 capsule    Refill:  5   Referral Orders  No referral(s) requested today     Note is dictated utilizing voice recognition software. Although note has been proof read prior to signing, occasional typographical errors still can be  missed. If any questions arise, please do not hesitate to call for verification.   electronically signed by:  Howard Pouch, DO  [place]

## 2020-11-18 NOTE — Patient Instructions (Signed)
Back Exercises The following exercises strengthen the muscles that help to support the trunk and back. They also help to keep the lower back flexible. Doing these exercises can help to prevent back pain or lessen existing pain. If you have back pain or discomfort, try doing these exercises 2-3 times each day or as told by your health care provider. As your pain improves, do them once each day, but increase the number of times that you repeat the steps for each exercise (do more repetitions). To prevent the recurrence of back pain, continue to do these exercises once each day or as told by your health care provider. Do exercises exactly as told by your health care provider and adjust them as directed. It is normal to feel mild stretching, pulling, tightness, or discomfort as you do these exercises, but you should stop right away if youfeel sudden pain or your pain gets worse. Exercises Single knee to chest Repeat these steps 3-5 times for each leg: Lie on your back on a firm bed or the floor with your legs extended. Bring one knee to your chest. Your other leg should stay extended and in contact with the floor. Hold your knee in place by grabbing your knee or thigh with both hands and hold. Pull on your knee until you feel a gentle stretch in your lower back or buttocks. Hold the stretch for 10-30 seconds. Slowly release and straighten your leg. Pelvic tilt Repeat these steps 5-10 times: Lie on your back on a firm bed or the floor with your legs extended. Bend your knees so they are pointing toward the ceiling and your feet are flat on the floor. Tighten your lower abdominal muscles to press your lower back against the floor. This motion will tilt your pelvis so your tailbone points up toward the ceiling instead of pointing to your feet or the floor. With gentle tension and even breathing, hold this position for 5-10 seconds. Cat-cow Repeat these steps until your lower back becomes more  flexible: Get into a hands-and-knees position on a firm surface. Keep your hands under your shoulders, and keep your knees under your hips. You may place padding under your knees for comfort. Let your head hang down toward your chest. Contract your abdominal muscles and point your tailbone toward the floor so your lower back becomes rounded like the back of a cat. Hold this position for 5 seconds. Slowly lift your head, let your abdominal muscles relax and point your tailbone up toward the ceiling so your back forms a sagging arch like the back of a cow. Hold this position for 5 seconds.  Press-ups Repeat these steps 5-10 times: Lie on your abdomen (face-down) on the floor. Place your palms near your head, about shoulder-width apart. Keeping your back as relaxed as possible and keeping your hips on the floor, slowly straighten your arms to raise the top half of your body and lift your shoulders. Do not use your back muscles to raise your upper torso. You may adjust the placement of your hands to make yourself more comfortable. Hold this position for 5 seconds while you keep your back relaxed. Slowly return to lying flat on the floor.  Bridges Repeat these steps 10 times: Lie on your back on a firm surface. Bend your knees so they are pointing toward the ceiling and your feet are flat on the floor. Your arms should be flat at your sides, next to your body. Tighten your buttocks muscles and lift your   buttocks off the floor until your waist is at almost the same height as your knees. You should feel the muscles working in your buttocks and the back of your thighs. If you do not feel these muscles, slide your feet 1-2 inches farther away from your buttocks. Hold this position for 3-5 seconds. Slowly lower your hips to the starting position, and allow your buttocks muscles to relax completely. If this exercise is too easy, try doing it with your arms crossed over yourchest. Abdominal  crunches Repeat these steps 5-10 times: Lie on your back on a firm bed or the floor with your legs extended. Bend your knees so they are pointing toward the ceiling and your feet are flat on the floor. Cross your arms over your chest. Tip your chin slightly toward your chest without bending your neck. Tighten your abdominal muscles and slowly raise your trunk (torso) high enough to lift your shoulder blades a tiny bit off the floor. Avoid raising your torso higher than that because it can put too much stress on your low back and does not help to strengthen your abdominal muscles. Slowly return to your starting position. Back lifts Repeat these steps 5-10 times: Lie on your abdomen (face-down) with your arms at your sides, and rest your forehead on the floor. Tighten the muscles in your legs and your buttocks. Slowly lift your chest off the floor while you keep your hips pressed to the floor. Keep the back of your head in line with the curve in your back. Your eyes should be looking at the floor. Hold this position for 3-5 seconds. Slowly return to your starting position. Contact a health care provider if: Your back pain or discomfort gets much worse when you do an exercise. Your worsening back pain or discomfort does not lessen within 2 hours after you exercise. If you have any of these problems, stop doing these exercises right away. Do not do them again unless your health care provider says that you can. Get help right away if: You develop sudden, severe back pain. If this happens, stop doing the exercises right away. Do not do them again unless your health care provider says that you can. This information is not intended to replace advice given to you by your health care provider. Make sure you discuss any questions you have with your healthcare provider. Document Revised: 07/18/2018 Document Reviewed: 12/13/2017 Elsevier Patient Education  [place].

## 2020-11-25 ENCOUNTER — Other Ambulatory Visit: Payer: Self-pay

## 2020-11-25 ENCOUNTER — Ambulatory Visit
Admission: RE | Admit: 2020-11-25 | Discharge: 2020-11-25 | Disposition: A | Discharge status: Home / Self Care | Payer: Managed Care, Other (non HMO) | Source: Ambulatory Visit | Attending: Family Medicine | Admitting: Family Medicine

## 2020-11-25 DIAGNOSIS — R918 Other nonspecific abnormal finding of lung field: Secondary | ICD-10-CM

## 2020-11-25 DIAGNOSIS — Z9189 Other specified personal risk factors, not elsewhere classified: Secondary | ICD-10-CM

## 2020-11-30 ENCOUNTER — Telehealth: Payer: Self-pay

## 2020-11-30 DIAGNOSIS — J432 Centrilobular emphysema: Secondary | ICD-10-CM

## 2020-11-30 DIAGNOSIS — Z9189 Other specified personal risk factors, not elsewhere classified: Secondary | ICD-10-CM

## 2020-11-30 NOTE — Telephone Encounter (Signed)
Spoke with patient and notified her that Dr. Raoul Pitch is not in the office until Monday

## 2020-11-30 NOTE — Telephone Encounter (Signed)
Patient has seen her ct results in mychart - and has questions about the results.  Please call [phone].

## 2020-12-06 ENCOUNTER — Telehealth: Payer: Self-pay

## 2020-12-06 DIAGNOSIS — J449 Chronic obstructive pulmonary disease, unspecified: Secondary | ICD-10-CM | POA: Insufficient documentation

## 2020-12-06 DIAGNOSIS — J439 Emphysema, unspecified: Secondary | ICD-10-CM | POA: Insufficient documentation

## 2020-12-06 NOTE — Telephone Encounter (Signed)
Patient calling back regarding CT scan results.  Please call patient at [phone].

## 2020-12-06 NOTE — Telephone Encounter (Signed)
Please inform patient the following information: I have been on vacation until this morning, otherwise we would have called her sooner. Her CT chest showed an unchanged pulmonary nodule in the right upper lobe.  They suspect this is a lymph node given the location.  Recommendations are to repeat CT chest in 12 months if she is considered higher risk, which she would be since she is a daily smoker.  The emphysema changes seen on prior will CT similar-appearing with severe upper lobe predominant emphysema.  Did partially visualize her neck hardware from her surgery and it appeared normal.   Given the severe upper lobe emphysema changes, which are likely from smoking, I have referred her to pulmonology for further evaluation and management.

## 2020-12-06 NOTE — Telephone Encounter (Signed)
LM for pt to returncall

## 2020-12-06 NOTE — Telephone Encounter (Signed)
A user error has taken place: encounter opened in error, closed for administrative reasons.

## 2020-12-06 NOTE — Telephone Encounter (Signed)
Spoke with patient regarding results/recommendations,voiced understanding.  

## 2020-12-06 NOTE — Telephone Encounter (Signed)
FYI. Please see below.  Spoke with patient regarding results/recommendations,voiced understanding. She wanted to know what stage emphysema she is in and advised I was not able to relay because I do not know. The pulmonologist may be more knowledgeable regarding this.

## 2020-12-06 NOTE — Telephone Encounter (Signed)
Emphysema is stated as "severe, "by visualization of CT report. She will need pulmonary function testing to see how they are working/functioning-that is how COPD/emphysema is staged.

## 2020-12-07 ENCOUNTER — Encounter: Payer: Self-pay | Admitting: Family Medicine

## 2020-12-08 NOTE — Telephone Encounter (Signed)
Please provide pt with medical records phone number. Thanks

## 2020-12-20 ENCOUNTER — Encounter (HOSPITAL_COMMUNITY): Payer: Self-pay

## 2020-12-20 ENCOUNTER — Emergency Department (HOSPITAL_COMMUNITY): Payer: Self-pay

## 2020-12-20 ENCOUNTER — Other Ambulatory Visit: Payer: Self-pay

## 2020-12-20 ENCOUNTER — Emergency Department (HOSPITAL_COMMUNITY)
Admission: EM | Admit: 2020-12-20 | Discharge: 2020-12-21 | Disposition: A | Discharge status: Home / Self Care | Payer: Self-pay | Attending: Emergency Medicine | Admitting: Emergency Medicine

## 2020-12-20 DIAGNOSIS — F17210 Nicotine dependence, cigarettes, uncomplicated: Secondary | ICD-10-CM | POA: Insufficient documentation

## 2020-12-20 DIAGNOSIS — R109 Unspecified abdominal pain: Secondary | ICD-10-CM

## 2020-12-20 DIAGNOSIS — D72829 Elevated white blood cell count, unspecified: Secondary | ICD-10-CM | POA: Insufficient documentation

## 2020-12-20 DIAGNOSIS — J45909 Unspecified asthma, uncomplicated: Secondary | ICD-10-CM | POA: Insufficient documentation

## 2020-12-20 DIAGNOSIS — R1031 Right lower quadrant pain: Secondary | ICD-10-CM | POA: Insufficient documentation

## 2020-12-20 DIAGNOSIS — E039 Hypothyroidism, unspecified: Secondary | ICD-10-CM | POA: Insufficient documentation

## 2020-12-20 DIAGNOSIS — D369 Benign neoplasm, unspecified site: Secondary | ICD-10-CM

## 2020-12-20 DIAGNOSIS — Z79899 Other long term (current) drug therapy: Secondary | ICD-10-CM | POA: Insufficient documentation

## 2020-12-20 LAB — CBC WITH DIFFERENTIAL/PLATELET
Abs Immature Granulocytes: 0.09 10*3/uL — ABNORMAL HIGH (ref 0.00–0.07)
Basophils Absolute: 0.1 10*3/uL (ref 0.0–0.1)
Basophils Relative: 1 %
Eosinophils Absolute: 0.3 10*3/uL (ref 0.0–0.5)
Eosinophils Relative: 2 %
HCT: 48 % — ABNORMAL HIGH (ref 36.0–46.0)
Hemoglobin: 16 g/dL — ABNORMAL HIGH (ref 12.0–15.0)
Immature Granulocytes: 1 %
Lymphocytes Relative: 17 %
Lymphs Abs: 2.6 10*3/uL (ref 0.7–4.0)
MCH: 33.1 pg (ref 26.0–34.0)
MCHC: 33.3 g/dL (ref 30.0–36.0)
MCV: 99.2 fL (ref 80.0–100.0)
Monocytes Absolute: 1.2 10*3/uL — ABNORMAL HIGH (ref 0.1–1.0)
Monocytes Relative: 8 %
Neutro Abs: 11.1 10*3/uL — ABNORMAL HIGH (ref 1.7–7.7)
Neutrophils Relative %: 71 %
Platelets: 392 10*3/uL (ref 150–400)
RBC: 4.84 MIL/uL (ref 3.87–5.11)
RDW: 13.3 % (ref 11.5–15.5)
WBC: 15.4 10*3/uL — ABNORMAL HIGH (ref 4.0–10.5)
nRBC: 0 % (ref 0.0–0.2)

## 2020-12-20 LAB — COMPREHENSIVE METABOLIC PANEL
ALT: 10 U/L (ref 0–44)
AST: 15 U/L (ref 15–41)
Albumin: 4.1 g/dL (ref 3.5–5.0)
Alkaline Phosphatase: 62 U/L (ref 38–126)
Anion gap: 9 (ref 5–15)
BUN: 10 mg/dL (ref 6–20)
CO2: 27 mmol/L (ref 22–32)
Calcium: 9.6 mg/dL (ref 8.9–10.3)
Chloride: 105 mmol/L (ref 98–111)
Creatinine, Ser: 0.63 mg/dL (ref 0.44–1.00)
GFR, Estimated: >60 mL/min (ref >60)
Glucose, Bld: 117 mg/dL — ABNORMAL HIGH (ref 70–99)
Potassium: 4.6 mmol/L (ref 3.5–5.1)
Sodium: 141 mmol/L (ref 135–145)
Total Bilirubin: 0.4 mg/dL (ref 0.3–1.2)
Total Protein: 7.7 g/dL (ref 6.5–8.1)

## 2020-12-20 LAB — URINALYSIS, ROUTINE W REFLEX MICROSCOPIC
Bilirubin Urine: NEGATIVE
Glucose, UA: NEGATIVE mg/dL
Hgb urine dipstick: NEGATIVE
Ketones, ur: NEGATIVE mg/dL
Leukocytes,Ua: NEGATIVE
Nitrite: NEGATIVE
Protein, ur: NEGATIVE mg/dL
Specific Gravity, Urine: >1.03 — ABNORMAL HIGH (ref 1.005–1.030)
pH: 6 (ref 5.0–8.0)

## 2020-12-20 LAB — LIPASE, BLOOD: Lipase: 27 U/L (ref 11–51)

## 2020-12-20 NOTE — ED Notes (Signed)
Labeled urine specimen and culture sent to lab. ENMiles 

## 2020-12-20 NOTE — ED Provider Notes (Signed)
Emergency Medicine Provider Triage Evaluation Note  Shaughnessy Gethers , a 45 y.o. female  was evaluated in triage.  Pt complains of right shoulder and right-sided abdominal pain.  Patient states initially pain began in her right shoulder.  Since then she has developed a lot of pain in her right flank and right side of her abdomen.  No urinary symptoms.  No normal bowel movements.  Pain is occasionally worse after eating or drinking.  Pain is also worse with movement, specifically standing.  Does not radiate into the leg.  No pain on the left side.  Review of Systems  Positive: R sided abd pain, nausea, R shoulder/upper back pain Negative: fever  Physical Exam  BP (!) 142/98 (BP Location: Right Arm)   Pulse (!) 109   Temp 98.6 F (37 C) (Oral)   Resp 15   SpO2 100%  Gen:   Awake, no distress   Resp:  Normal effort. ctab MSK:   Moves extremities without difficulty Other:  R CVA tenderness. Ttp of RUQ abd.   Medical Decision Making  Medically screening exam initiated at 9:41 PM.  Appropriate orders placed.  Sharnice Chandonnet was informed that the remainder of the evaluation will be completed by another provider, this initial triage assessment does not replace that evaluation, and the importance of remaining in the ED until their evaluation is complete.  Labs, ct renal, RUQ [place].   Franchot Heidelberg, PA-C 12/20/20 2144    Valarie Merino, MD 12/20/20 [phone]

## 2020-12-20 NOTE — ED Triage Notes (Signed)
Pt complains of right sided flank and abdominal pain x 2 weeks.

## 2020-12-21 ENCOUNTER — Emergency Department (HOSPITAL_COMMUNITY): Payer: Self-pay

## 2020-12-21 ENCOUNTER — Encounter (HOSPITAL_COMMUNITY): Payer: Self-pay | Admitting: Emergency Medicine

## 2020-12-21 MED ORDER — HYDROMORPHONE HCL 1 MG/ML IJ SOLN
0.5000 mg | Freq: Once | INTRAMUSCULAR | Status: DC
  Filled 2020-12-21: qty 1

## 2020-12-21 MED ORDER — KETOROLAC TROMETHAMINE 15 MG/ML IJ SOLN
15.0000 mg | Freq: Once | INTRAMUSCULAR | Status: AC
  Administered 2020-12-21: 15 mg via INTRAVENOUS
  Filled 2020-12-21: qty 1

## 2020-12-21 MED ORDER — SODIUM CHLORIDE 0.9 % IV BOLUS
1000.0000 mL | Freq: Once | INTRAVENOUS | Status: AC
Start: 2020-12-21 — End: 2020-12-21
  Administered 2020-12-21: 1000 mL via INTRAVENOUS

## 2020-12-21 MED ORDER — ONDANSETRON 4 MG PO TBDP: 4.0000 mg | ORAL_TABLET | Freq: Three times a day (TID) | ORAL | 0 refills | Status: DC | PRN

## 2020-12-21 MED ORDER — MORPHINE SULFATE (PF) 4 MG/ML IV SOLN
4.0000 mg | Freq: Once | INTRAVENOUS | Status: AC
  Administered 2020-12-21: 4 mg via INTRAVENOUS
  Filled 2020-12-21: qty 1

## 2020-12-21 MED ORDER — HYDROCODONE-ACETAMINOPHEN 5-325 MG PO TABS: 1.0000 | ORAL_TABLET | Freq: Four times a day (QID) | ORAL | 0 refills | Status: DC | PRN

## 2020-12-21 MED ORDER — NAPROXEN 500 MG PO TABS: 500.0000 mg | ORAL_TABLET | Freq: Two times a day (BID) | ORAL | 0 refills | Status: DC | PRN

## 2020-12-21 MED ORDER — ONDANSETRON HCL 4 MG/2ML IJ SOLN
4.0000 mg | Freq: Once | INTRAMUSCULAR | Status: AC
  Administered 2020-12-21: 4 mg via INTRAVENOUS
  Filled 2020-12-21: qty 2

## 2020-12-21 NOTE — ED Notes (Signed)
Patient verbalizes understanding of discharge instructions. Opportunity for questioning and answers were provided. Armband removed by staff, pt discharged from ED. Ambulated out to lobby  

## 2020-12-21 NOTE — ED Provider Notes (Signed)
[facility] DEPT Provider Note   CSN: [id number] Arrival date & time: 12/20/20  2040     History Chief Complaint  Patient presents with   Abdominal Pain   Flank Pain    Laura Steele is a 45 y.o. female with a hx of asthma, hypothyroidism, tobacco use, anxiety, depression and prior hysterectomy who presents to the ED with complaints of abdominal pain x 2 weeks.  Patient reports intermittent pain to the right flank into the right lower quadrant of the abdomen for the past 2 weeks, occurs daily, typically improved with application of heat, however pain was more severe and persistent today prompting emergency department visit.  Current pain is a 9 out of 10 in severity, worse with certain positions.  Some associated nausea and vomiting with the pain.  She denies fever, chills, hematemesis, melena, hematochezia, diarrhea, constipation, vaginal bleeding, vaginal discharge, or dysuria.  She is sexually active in a monogamous relationship with her husband and does not have any concern for STIs.  HPI     Past Medical History:  Diagnosis Date   Allergy    Anxiety    Asthma    Headache    Migraines   Hypoglycemia    Hypothyroidism    Pneumonia     Patient Active Problem List   Diagnosis Date Noted   Emphysema lung ([place]) 12/06/2020   Vitamin D deficiency 10/11/2020   Ground glass opacity present on imaging of lung 10/11/2020   Pulmonary nodule less than 1 cm in diameter with moderate to high risk for malignant neoplasm 10/11/2020   Alcohol withdrawal delirium ([place]) 04/25/2020   Anxiety and depression 04/25/2020   Hypothyroidism 03/25/2019   Carpal tunnel syndrome, bilateral 01/17/2018   DDD (degenerative disc disease), cervical 01/17/2018   B12 deficiency 12/26/2017   Folate deficiency 12/26/2017   Elevated hemoglobin ([facility]) 10/24/2017   Elevated glucose 10/24/2017   Muscle spasm 06/22/2017   Insomnia 06/22/2017   Abnormal mammogram of right  breast 01/26/2017   Macrocytosis without anemia 12/05/2016   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 Christella Noa     OB History   No obstetric history on file.     Family History  Problem Relation Age of Onset   Diabetes Mother    Breast cancer Mother        had masectomy.    Skin cancer Maternal Grandfather     Social History   Tobacco Use   Smoking status: Every Day    Packs/day: 1.00    Types: Cigarettes   Smokeless tobacco: Never  Vaping Use   Vaping Use: Never used  Substance Use Topics   Alcohol use: Yes    Comment: Pt states was drinkning 40 0z of beer a night. but stopped since sunday.   Drug use: No    Home Medications Prior to Admission medications   Medication Sig Start Date End Date Taking? Authorizing Provider  albuterol (VENTOLIN HFA) 108 (90 Base) MCG/ACT inhaler Inhale 1-2 puffs into the lungs daily as needed for wheezing or shortness of breath. 10/25/20   Kuneff, Renee A, DO  Cholecalciferol (VITAMIN D3)  50 MCG (2000 UT) capsule Take 1 capsule (2,000 Units total) by mouth daily. 10/13/20   Kuneff, Renee A, DO  Diclofenac Sodium CR 100 MG 24 hr tablet Take 1 tablet (100 mg total) by mouth daily. 10/11/20   Kuneff, Renee A, DO  DULoxetine (CYMBALTA) 60 MG capsule TAKE 1 CAPSULE BY MOUTH BID 10/11/20   Kuneff, Renee A, DO  glycopyrrolate (ROBINUL) 1 MG tablet Take 1 tablet (1 mg total) by mouth daily. 10/11/20   Kuneff, Renee A, DO  hydrOXYzine (ATARAX/VISTARIL) 25 MG tablet Take 1 tablet (25 mg total) by mouth at bedtime. 10/11/20   Kuneff, Renee A, DO  levothyroxine (SYNTHROID) 50  MCG tablet Take 1 tablet (50 mcg total) by mouth daily before breakfast. 10/13/20   Kuneff, Renee A, DO  pantoprazole (PROTONIX) 20 MG tablet Take 20 mg by mouth daily. 06/09/20   [provider]  pregabalin (LYRICA) 75 MG capsule Take 1 capsule (75 mg total) by mouth 2 (two) times daily. 10/11/20   Kuneff, Renee A, DO  tizanidine (ZANAFLEX) 2 MG capsule Take 1 capsule (2 mg total) by mouth 3 (three) times daily as needed for muscle spasms. 11/18/20   Kuneff, Renee A, DO    Allergies    Patient has no known allergies.  Review of Systems   Review of Systems  Constitutional:  Negative for chills and fever.  Respiratory:  Negative for shortness of breath.   Cardiovascular:  Negative for chest pain.  Gastrointestinal:  Positive for abdominal pain, nausea and vomiting. Negative for blood in stool, constipation and diarrhea.  Genitourinary:  Positive for flank pain. Negative for dysuria, vaginal bleeding and vaginal discharge.  Neurological:  Negative for syncope.  All other systems reviewed and are negative.  Physical Exam Updated Vital Signs BP (!) 142/98 (BP Location: Right Arm)   Pulse (!) 109   Temp 98.6 F (37 C) (Oral)   Resp 15   SpO2 100%   Physical Exam Vitals and nursing note reviewed.  Constitutional:      General: She is not in acute distress.    Appearance: She is well-developed. She is not toxic-appearing.  HENT:     Head: Normocephalic and atraumatic.  Eyes:     General:        Right eye: No discharge.        Left eye: No discharge.     Conjunctiva/sclera: Conjunctivae normal.  Cardiovascular:     Rate and Rhythm: Normal rate and regular rhythm.  Pulmonary:     Effort: Pulmonary effort is normal. No respiratory distress.     Breath sounds: Normal breath sounds. No wheezing, rhonchi or rales.  Abdominal:     General: There is no distension.     Palpations: Abdomen is soft.     Tenderness: There is abdominal tenderness in the right lower quadrant. There is  no right CVA tenderness, left CVA tenderness, guarding or rebound.  Musculoskeletal:     Cervical back: Neck supple.  Skin:    General: Skin is warm and dry.     Findings: No rash.  Neurological:     Mental Status: She is alert.     Comments: Clear speech.   Psychiatric:        Behavior: Behavior normal.    ED Results / Procedures / Treatments   Labs (all labs ordered are listed, but only abnormal results are displayed) Labs Reviewed  COMPREHENSIVE METABOLIC PANEL - Abnormal; Notable for the following components:  Result Value   Glucose, Bld 117 (*)    All other components within normal limits  URINALYSIS, ROUTINE W REFLEX MICROSCOPIC - Abnormal; Notable for the following components:   Specific Gravity, Urine >1.030 (*)    All other components within normal limits  CBC WITH DIFFERENTIAL/PLATELET - Abnormal; Notable for the following components:   WBC 15.4 (*)    Hemoglobin 16.0 (*)    HCT 48.0 (*)    Neutro Abs 11.1 (*)    Monocytes Absolute 1.2 (*)    Abs Immature Granulocytes 0.09 (*)    All other components within normal limits  LIPASE, BLOOD    EKG None  Radiology US Transvaginal Non-OB  Result Date: 12/21/2020 CLINICAL DATA:  Pelvic mass, unspecified abdominal pain EXAM: TRANSABDOMINAL AND TRANSVAGINAL ULTRASOUND OF PELVIS DOPPLER ULTRASOUND OF OVARIES TECHNIQUE: Both transabdominal and transvaginal ultrasound examinations of the pelvis were performed. Transabdominal technique was performed for global imaging of the pelvis including uterus, ovaries, adnexal regions, and pelvic cul-de-sac. It was necessary to proceed with endovaginal exam following the transabdominal exam to visualize the ovaries bilaterally. Color and duplex Doppler ultrasound was utilized to evaluate blood flow to the ovaries. COMPARISON:  CT 12/20/2020 FINDINGS: Uterus Absent Endometrium Not applicable Right ovary Measurements: 9.7 x 5.2 x 6.6 cm = volume: 174 mL. The right ovary is essentially  replaced by a complex heterogeneously hyper and hypoechoic partially centrally cystic mass measuring roughly 9.4 x 5.5 x 7.8 cm in greatest dimension. While the imaging characteristics on this examination are nonspecific, the prior CT examination demonstrates macroscopic fat and this is compatible with an ovarian dermoid. No internal vascularity is identified. Left ovary Not visualized Pulsed Doppler evaluation of the visualized right ovary demonstrates essentially no significant vascularity Other findings No abnormal free fluid. 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 While no internal vascularity is identified, these lesions typically demonstrate no significant internal vascularity and torsion of the lesion is difficult to exclude by imaging alone. There are no secondary signs such as perilesional edema or free fluid within the pelvis, however. Nonvisualization of the left ovary. Status post hysterectomy. Electronically Signed   By: Fidela Salisbury M.D.   On: 12/21/2020 03:09   US Pelvis Complete  Result Date: 12/21/2020 CLINICAL DATA:  Pelvic mass, unspecified abdominal pain EXAM: TRANSABDOMINAL AND TRANSVAGINAL ULTRASOUND OF PELVIS DOPPLER ULTRASOUND OF OVARIES TECHNIQUE: Both transabdominal and transvaginal ultrasound examinations of the pelvis were performed. Transabdominal technique was performed for global imaging of the pelvis including uterus, ovaries, adnexal regions, and pelvic cul-de-sac. It was necessary to proceed with endovaginal exam following the transabdominal exam to visualize the ovaries bilaterally. Color and duplex Doppler ultrasound was utilized to evaluate blood flow to the ovaries. COMPARISON:  CT 12/20/2020 FINDINGS: Uterus Absent Endometrium Not applicable Right ovary Measurements: 9.7 x 5.2 x 6.6 cm = volume: 174 mL. The right ovary is essentially replaced by a complex heterogeneously hyper and hypoechoic partially centrally cystic mass  measuring roughly 9.4 x 5.5 x 7.8 cm in greatest dimension. While the imaging characteristics on this examination are nonspecific, the prior CT examination demonstrates macroscopic fat and this is compatible with an ovarian dermoid. No internal vascularity is identified. Left ovary Not visualized Pulsed Doppler evaluation of the visualized right ovary demonstrates essentially no significant vascularity Other findings No abnormal free fluid. 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 While no internal vascularity is identified, these lesions typically demonstrate no  significant internal vascularity and torsion of the lesion is difficult to exclude by imaging alone. There are no secondary signs such as perilesional edema or free fluid within the pelvis, however. Nonvisualization of the left ovary. Status post hysterectomy. Electronically Signed   By: Fidela Salisbury M.D.   On: 12/21/2020 03:09   [place] Art/Ven Flow Abd Pelv Doppler  Result Date: 12/21/2020 CLINICAL DATA:  Pelvic mass, unspecified abdominal pain EXAM: TRANSABDOMINAL AND TRANSVAGINAL ULTRASOUND OF PELVIS DOPPLER ULTRASOUND OF OVARIES TECHNIQUE: Both transabdominal and transvaginal ultrasound examinations of the pelvis were performed. Transabdominal technique was performed for global imaging of the pelvis including uterus, ovaries, adnexal regions, and pelvic cul-de-sac. It was necessary to proceed with endovaginal exam following the transabdominal exam to visualize the ovaries bilaterally. Color and duplex Doppler ultrasound was utilized to evaluate blood flow to the ovaries. COMPARISON:  CT 12/20/2020 FINDINGS: Uterus Absent Endometrium Not applicable Right ovary Measurements: 9.7 x 5.2 x 6.6 cm = volume: 174 mL. The right ovary is essentially replaced by a complex heterogeneously hyper and hypoechoic partially centrally cystic mass measuring roughly 9.4 x 5.5 x 7.8 cm in greatest dimension. While the imaging  characteristics on this examination are nonspecific, the prior CT examination demonstrates macroscopic fat and this is compatible with an ovarian dermoid. No internal vascularity is identified. Left ovary Not visualized Pulsed Doppler evaluation of the visualized right ovary demonstrates essentially no significant vascularity Other findings No abnormal free fluid. 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 While no internal vascularity is identified, these lesions typically demonstrate no significant internal vascularity and torsion of the lesion is difficult to exclude by imaging alone. There are no secondary signs such as perilesional edema or free fluid within the pelvis, however. Nonvisualization of the left ovary. Status post hysterectomy. Electronically Signed   By: Fidela Salisbury M.D.   On: 12/21/2020 03:09   CT Renal Stone Study  Result Date: 12/20/2020 CLINICAL DATA:  Flank pain, kidney stone suspected. right shoulder and right-sided abdominal pain EXAM: CT ABDOMEN AND PELVIS WITHOUT CONTRAST TECHNIQUE: Multidetector CT imaging of the abdomen and pelvis was performed following the standard protocol without IV contrast. COMPARISON:  None. FINDINGS: Lower chest: No acute abnormality. Hepatobiliary: No focal liver abnormality. No gallstones, gallbladder wall thickening, or pericholecystic fluid. No biliary dilatation. Pancreas: No focal lesion. Normal pancreatic contour. No surrounding inflammatory changes. No main pancreatic ductal dilatation. Spleen: Normal in size without focal abnormality. Adrenals/Urinary Tract: No adrenal nodule bilaterally. Bilateral kidneys enhance symmetrically. No hydronephrosis. No hydroureter. The urinary bladder is unremarkable. Stomach/Bowel: Stomach is within normal limits. No evidence of bowel wall thickening or dilatation. The appendix not definitely identified. Vascular/Lymphatic: No abdominal aorta or iliac aneurysm. Mild  atherosclerotic plaque of the aorta and its branches. No abdominal, pelvic, or inguinal lymphadenopathy. Reproductive: Status post hysterectomy. There is a 10 x 7 x 7 cm heterogeneous mass with fat, fluid, soft tissue, calcification densities within the right ovary. The left ovary is unremarkable. The left adnexal region is unremarkable. Other: No intraperitoneal free fluid. No intraperitoneal free gas. No organized fluid collection. Musculoskeletal: No abdominal wall hernia or abnormality. No suspicious lytic or blastic osseous lesions. No acute displaced fracture. L5-S1 intervertebral disc space vacuum phenomenon and endplate sclerosis. IMPRESSION: 1. 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 Associated ovarian torsion is not excluded. Recommend gynecologic consultation. 2.  Aortic Atherosclerosis (ICD10-I70.0). Electronically Signed   By: Iven Finn  M.D.   On: 12/20/2020 22:18   US Abdomen Limited RUQ (LIVER/GB)  Result Date: 12/20/2020 CLINICAL DATA:  Right upper quadrant pain for 2 weeks EXAM: ULTRASOUND ABDOMEN LIMITED RIGHT UPPER QUADRANT COMPARISON:  12/20/2020 FINDINGS: Gallbladder: No gallstones or wall thickening visualized. No sonographic Murphy sign noted by sonographer. Common bile duct: Diameter: 3 mm Liver: No focal lesion identified. Within normal limits in parenchymal echogenicity. Portal vein is patent on color Doppler imaging with normal direction of blood flow towards the liver. Other: None. IMPRESSION: 1. Unremarkable right upper quadrant ultrasound. Electronically Signed   By: Randa Ngo M.D.   On: 12/20/2020 22:28    Procedures Procedures   Medications Ordered in ED Medications  morphine 4 MG/ML injection 4 mg (4 mg Intravenous Given 12/21/20 0311)  ondansetron (ZOFRAN) injection 4 mg (4 mg Intravenous Given 12/21/20 0310)  sodium chloride 0.9 % bolus 1,000 mL (0 mLs Intravenous Stopped 12/21/20 0544)  ketorolac (TORADOL) 15 MG/ML  injection 15 mg (15 mg Intravenous Given 12/21/20 0515)    ED Course  I have reviewed the triage vital signs and the nursing notes.  Pertinent labs & imaging results that were available during my care of the patient were reviewed by me and considered in my medical decision making (see chart for details).    MDM Rules/Calculators/A&P                          Patient presents to the ED with complaints of abdominal pain. Patient nontoxic appearing, in no apparent distress, vitals with elevated BP- doubt HTN emergency. Exam with RLQ tenderness, no peritoneal signs.   Additional history obtained:  Additional history obtained from chart review & nursing note review.   Lab Tests:  I reviewed and interpreted labs, which included:  CBC: mild leukocytosis @ hgb/hct elevation.  CMP: Unremarkable.  Lipase: WNL UA: no UTI  Imaging Studies ordered:  RUQ [place] ordered in triage: 1. Unremarkable right upper quadrant ultrasound. CT renal stone study ordered in triage:  1. 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 Associated ovarian torsion is not excluded. Recommend gynecologic consultation. 2.  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 While no internal vascularity is identified, these lesions typically demonstrate no significant internal vascularity and torsion of the lesion is difficult to exclude by imaging alone. There are no secondary signs such as perilesional edema or free fluid within the pelvis, however. Nonvisualization of the left ovary. Status post hysterectomy  ED Course:  Suspect patient's pain is from large dermoid. Given US findings will discuss w/ GYN on call. Pelvic exam deferred given patient without bleeding/discharge and no concern for STI- feel PID is unlikely.    05:05: CONSULT: Discussed with Dr. Harolyn Rutherford, OBGYN, relays she does not think this is torsion given size of the dermoid, does not need emergent surgical intervention, appropriate for outpatient clinic follow up. She has sent message to office staff to coordinate scheduling patient, appreciate input.   Patient resting comfortably on re-assessment, feeling improved, tolerating PO. Overall appears appropriate for discharge with supportive care and GYN follow up. I discussed results, treatment plan, need for follow-up, and return precautions with the patient. Provided opportunity for questions, patient confirmed understanding and is in agreement with plan.   Portions of this note  were generated with [employer]. Dictation errors may occur despite best attempts at proofreading.  Final Clinical Impression(s) / ED Diagnoses Final diagnoses:  Right sided abdominal pain    Rx / DC Orders ED Discharge Orders          Ordered    naproxen (NAPROSYN) 500 MG tablet  2 times daily PRN        12/21/20 0545    HYDROcodone-acetaminophen (NORCO/VICODIN) 5-325 MG tablet  Every 6 hours PRN        12/21/20 0545    ondansetron (ZOFRAN ODT) 4 MG disintegrating tablet  Every 8 hours PRN        12/21/20 [place] Controlled Substance reporting System queried    Leafy Kindle 12/21/20 0552    Merrily Pew, MD 12/21/20 [phone]

## 2020-12-21 NOTE — Discharge Instructions (Addendum)
You were seen in the ER today for abdominal pain.  Your blood work showed that your white blood cell count and hemoglobin were elevated- please have these rechecked by primary care.   Your CT and ultrasound showed findings of a 9-10 cm sized dermoid in the area of your right ovary. We suspect this is causing your pain. We are sending you home with the following medications to help your symptoms:   - Naproxen is a nonsteroidal anti-inflammatory medication that will help with pain and swelling. Be sure to take this medication as prescribed with food, 1 pill every 12 hours,  It should be taken with food, as it can cause stomach upset, and more seriously, stomach bleeding. Do not take other nonsteroidal anti-inflammatory medications with this such as Advil, Motrin, Aleve, Mobic, Goodie Powder, or Motrin.  If your pain is not alleviated with naproxen we are sending you home a short course of Norco tablets to take as needed.     -Norco-this is a narcotic/controlled substance medication that has potential addicting qualities.  We recommend that you take 1-2 tablets every 6 hours as needed for severe pain.  Do not drive or operate heavy machinery when taking this medicine as it can be sedating. Do not drink alcohol or take other sedating medications when taking this medicine for safety reasons.  Keep this out of reach of small children.  Please be aware this medicine has Tylenol in it (325 mg/tab) do not exceed the maximum dose of Tylenol in a day per over the counter recommendations should you decide to supplement with Tylenol over the counter.   - Zofran- take every 8 hours as needed for nausea/vomiting.   We have prescribed you new medication(s) today. Discuss the medications prescribed today with your pharmacist as they can have adverse effects and interactions with your other medicines including over the counter and prescribed medications. Seek medical evaluation if you start to experience new or abnormal  symptoms after taking one of these medicines, seek care immediately if you start to experience difficulty breathing, feeling of your throat closing, facial swelling, or rash as these could be indications of a more serious allergic reaction  The OB/GYN office will be calling to schedule a follow-up appointment to further discuss management options of the dermoid.  We have also provided their clinic information for you.  Please follow-up with them as soon as possible.  Return to the emergency department for new or worsening symptoms including but not limited to new or worsening pain, inability to keep fluids down, fever, passing out, or any other concerns.

## 2020-12-21 NOTE — ED Notes (Signed)
Patient has been eating chips and drinking soda.

## 2020-12-23 ENCOUNTER — Ambulatory Visit (INDEPENDENT_AMBULATORY_CARE_PROVIDER_SITE_OTHER): Payer: 59 | Admitting: Emergency Medicine

## 2020-12-23 ENCOUNTER — Other Ambulatory Visit: Payer: Self-pay

## 2020-12-23 ENCOUNTER — Encounter: Payer: Self-pay | Admitting: Emergency Medicine

## 2020-12-23 DIAGNOSIS — J432 Centrilobular emphysema: Secondary | ICD-10-CM

## 2020-12-23 DIAGNOSIS — Z9189 Other specified personal risk factors, not elsewhere classified: Secondary | ICD-10-CM

## 2020-12-23 DIAGNOSIS — Z720 Tobacco use: Secondary | ICD-10-CM | POA: Insufficient documentation

## 2020-12-23 DIAGNOSIS — R911 Solitary pulmonary nodule: Secondary | ICD-10-CM

## 2020-12-23 NOTE — Assessment & Plan Note (Signed)
Discussed her emphysema change on CT, its relationship to tobacco with her in detail today.  She is interested in stopping.  1 barrier will be that her husband is a smoker also.  For starters she is going to work on cutting down by her next visit to 5 cigarettes daily.  If she is able to accomplish that then we can talk about strategies to set a quit date.

## 2020-12-23 NOTE — Progress Notes (Addendum)
Subjective:    Patient ID: Laura Steele, female    DOB: 06-Dec-1975, 45 y.o.   MRN: [id number]  HPI 45 year old woman with history tobacco use (30 pack years, down to 0.5 pk/day), migraines, hypothyroidism, anxiety, alcohol use.  She carries a history of asthma that was made a couple yrs ago, ? In association w panic attack. Has had some benefit from albuterol.  She was seen in the ED 12/21/2020 with right flank and right lower quadrant pain for 2 weeks.  She was found to have a right ovarian dermoid cyst. She had a CT chest 11/25/2020 that I have reviewed, showed a 3 mm right lower lobe pulmonary nodule that was unchanged compared with prior done 05/02/2019    Review of Systems As per HPI  Past Medical History:  Diagnosis Date   Allergy    Anxiety    Asthma    Headache    Migraines   Hypoglycemia    Hypothyroidism    Pneumonia      Family History  Problem Relation Age of Onset   Diabetes Mother    Breast cancer Mother        had masectomy.    Skin cancer Maternal Grandfather     Lung cancer in her uncles   Social History   Socioeconomic History   Marital status: Married    Spouse name: Not on file   Number of children: Not on file   Years of education: Not on file   Highest education level: Not on file  Occupational History   Not on file  Tobacco Use   Smoking status: Every Day    Packs/day: 1.00    Types: Cigarettes   Smokeless tobacco: Never   Tobacco comments:    0.5 packs smoked per day [facility] 12/23/20  Vaping Use   Vaping Use: Never used  Substance and Sexual Activity   Alcohol use: Yes    Comment: Pt states was drinkning 40 0z of beer a night. but stopped since sunday.   Drug use: No   Sexual activity: Yes  Other Topics Concern   Not on file  Social History Narrative   Not on file   Social Determinants of Health   Financial Resource Strain: Not on file  Food Insecurity: Not on file  Transportation Needs: Not on file  Physical Activity: Not on  file  Stress: Not on file  Social Connections: Not on file  Intimate Partner Violence: Not on file    [place] dust exposure through [place]   No Known Allergies   Outpatient Medications Prior to Visit  Medication Sig Dispense Refill   albuterol (VENTOLIN HFA) 108 (90 Base) MCG/ACT inhaler Inhale 1-2 puffs into the lungs daily as needed for wheezing or shortness of breath. 8 g 5   Cholecalciferol (VITAMIN D3) 50 MCG (2000 UT) capsule Take 1 capsule (2,000 Units total) by mouth daily. 90 capsule 3   glycopyrrolate (ROBINUL) 1 MG tablet Take 1 tablet (1 mg total) by mouth daily. 90 tablet 1   HYDROcodone-acetaminophen (NORCO/VICODIN) 5-325 MG tablet Take 1-2 tablets by mouth every 6 (six) hours as needed. 8 tablet 0   levothyroxine (SYNTHROID) 50 MCG tablet Take 1 tablet (50 mcg total) by mouth daily before breakfast. 90 tablet 1   naproxen (NAPROSYN) 500 MG tablet Take 1 tablet (500 mg total) by mouth 2 (two) times daily as needed for moderate pain. 15 tablet 0   Aspirin-Acetaminophen (GOODYS BODY PAIN PO) Take 1  packet by mouth daily as needed. (Patient not taking: Reported on 12/23/2020)     Diclofenac Sodium CR 100 MG 24 hr tablet Take 1 tablet (100 mg total) by mouth daily. (Patient not taking: Reported on 12/23/2020) 90 tablet 1   DULoxetine (CYMBALTA) 60 MG capsule TAKE 1 CAPSULE BY MOUTH BID (Patient not taking: Reported on 12/23/2020) 180 capsule 1   hydrOXYzine (ATARAX/VISTARIL) 25 MG tablet Take 1 tablet (25 mg total) by mouth at bedtime. (Patient not taking: Reported on 12/23/2020) 90 tablet 1   ondansetron (ZOFRAN ODT) 4 MG disintegrating tablet Take 1 tablet (4 mg total) by mouth every 8 (eight) hours as needed for nausea or vomiting. (Patient not taking: Reported on 12/23/2020) 5 tablet 0   pantoprazole (PROTONIX) 20 MG tablet Take 20 mg by mouth daily. (Patient not taking: Reported on 12/23/2020)     pregabalin (LYRICA) 75 MG capsule Take 1 capsule (75 mg total) by mouth 2 (two)  times daily. (Patient not taking: Reported on 12/23/2020) 60 capsule 5   tizanidine (ZANAFLEX) 2 MG capsule Take 1 capsule (2 mg total) by mouth 3 (three) times daily as needed for muscle spasms. (Patient not taking: Reported on 12/23/2020) 90 capsule 5   No facility-administered medications prior to visit.         Objective:   Physical Exam Vitals:   12/23/20 1406  BP: 118/80  Pulse: (!) 110  Temp: 98.4 F (36.9 C)  TempSrc: Oral  SpO2: 100%  Weight: 147 lb 3.2 oz (66.8 kg)  Height: 5\' 2"  (1.575 m)   Gen: Pleasant, well-nourished, in no distress,  normal affect  ENT: No lesions,  mouth clear,  oropharynx clear, no postnasal drip  Neck: No JVD, no stridor  Lungs: No use of accessory muscles, no crackles or wheezing on normal respiration, no wheeze on forced expiration  Cardiovascular: RRR, heart sounds normal, no murmur or gallops, no peripheral edema  Musculoskeletal: No deformities, no cyanosis or clubbing  Neuro: alert, awake, non focal  Skin: Warm, no lesions or rash      Assessment & Plan:  Pulmonary nodule less than 1 cm in diameter with moderate to high risk for malignant neoplasm She has a 3-4 peripheral right lower lobe pulmonary nodule that is been stable now for 19 months.  Discussed his relevant with her today.  She needs to have a repeat CT in February 2023.  We will review at that time.  Presuming that nodule is stable, she would be a good candidate for lung cancer screening when she reaches age 88  COPD (chronic obstructive pulmonary disease) ([place]) She has emphysema on her CT chest and clinical symptoms consistent with COPD/asthma.  Suspect she does have significant COPD.  We will quantify with PFT.  We will check an alpha-1 antitrypsin.  Continue albuterol for now, she may benefit from scheduled BD therapy at some point going forward.  Tobacco use Discussed her emphysema change on CT, its relationship to tobacco with her in detail today.  She is  interested in stopping.  1 barrier will be that her husband is a smoker also.  For starters she is going to work on cutting down by her next visit to 5 cigarettes daily.  If she is able to accomplish that then we can talk about strategies to set a quit date.   Baltazar Apo, MD, PhD 12/23/2020, 2:42 PM [facility] [phone] or if no answer before 7:00PM call [phone] For any issues after 7:00PM  please call eLink [phone]    Addendum: Ms. Clausing is being evaluated for gynecological surgery for her dermoid cyst.  She has not had her PFTs yet, but based on my evaluation she is only at mildly increased risk for general anesthesia and abdominal surgery.  No barriers or contraindications identified.  We will forward this note and addendum to OB/GYN as part of their surgical restratification.   Baltazar Apo, MD, PhD 12/31/2020, 12:08 PM [facility] [phone] or if no answer before 7:00PM call [phone] For any issues after 7:00PM please call eLink [phone]

## 2020-12-23 NOTE — Addendum Note (Signed)
Addended by: Konrad Felix L on: 12/23/2020 03:29 PM   Modules accepted: Orders

## 2020-12-23 NOTE — Assessment & Plan Note (Signed)
She has a 3-4 peripheral right lower lobe pulmonary nodule that is been stable now for 19 months.  Discussed his relevant with her today.  She needs to have a repeat CT in February 2023.  We will review at that time.  Presuming that nodule is stable, she would be a good candidate for lung cancer screening when she reaches age 45

## 2020-12-23 NOTE — Patient Instructions (Signed)
We will plan to repeat your CT scan of the chest in February 2023 We will perform pulmonary function testing in next office visit Keep your albuterol available to use 2 puffs when needed for shortness of breath, chest tightness, wheezing. Congratulations on decreasing your smoking.  We set a goal today that you would be down to 5 cigarettes daily by our next visit Follow with Dr. Lamonte Sakai next available with full pulmonary function testing on the same day.

## 2020-12-23 NOTE — Assessment & Plan Note (Addendum)
She has emphysema on her CT chest and clinical symptoms consistent with COPD/asthma.  Suspect she does have significant COPD.  We will quantify with PFT.  We will check an alpha-1 antitrypsin.  Continue albuterol for now, she may benefit from scheduled BD therapy at some point going forward.

## 2020-12-29 ENCOUNTER — Ambulatory Visit (INDEPENDENT_AMBULATORY_CARE_PROVIDER_SITE_OTHER): Payer: Self-pay | Admitting: Obstetrics and Gynecology

## 2020-12-29 ENCOUNTER — Encounter: Payer: Self-pay | Admitting: Obstetrics and Gynecology

## 2020-12-29 ENCOUNTER — Other Ambulatory Visit: Payer: Self-pay

## 2020-12-29 VITALS — BP 136/95 | HR 106 | Wt 145.3 lb

## 2020-12-29 DIAGNOSIS — D270 Benign neoplasm of right ovary: Secondary | ICD-10-CM

## 2020-12-29 DIAGNOSIS — R102 Pelvic and perineal pain unspecified side: Secondary | ICD-10-CM

## 2020-12-29 DIAGNOSIS — J432 Centrilobular emphysema: Secondary | ICD-10-CM

## 2020-12-29 HISTORY — DX: Benign neoplasm of right ovary: D27.0

## 2020-12-29 MED ORDER — NAPROXEN 500 MG PO TABS: 500.0000 mg | ORAL_TABLET | Freq: Two times a day (BID) | ORAL | 0 refills | Status: DC | PRN

## 2020-12-29 MED ORDER — HYDROCODONE-ACETAMINOPHEN 5-325 MG PO TABS: 1.0000 | ORAL_TABLET | Freq: Four times a day (QID) | ORAL | 0 refills | Status: DC | PRN

## 2020-12-29 NOTE — Progress Notes (Signed)
CC: abdominal pain with pelvic mass Subjective:    Patient ID: Laura Steele, female    DOB: [birth year], 45 y.o.   MRN: [id number]  HPI 45 yo G3P3 , SVD x 2 c/s x 1, seen at [facility] with report of pelvic pain x 3 weeks.  The pain is constant and sharp.  Heating pad improves the pain.  Naprosyn and hydrocodone have also been helpful.  Occasional emesis. Negative dysuria.  Pt denies constipation/diarrhea.  CT and u/s are highly suspicious for large dermoid cyst.    Review of Systems  Constitutional:  Negative for activity change and fever.  Respiratory: Negative.    Cardiovascular: Negative.   Gastrointestinal:  Positive for abdominal pain and vomiting. Negative for constipation, diarrhea and nausea.  Genitourinary:  Positive for pelvic pain. Negative for difficulty urinating.      Objective:   Physical Exam Vitals:   12/29/20 1324  BP: (!) 136/95  Pulse: (!) 106   CLINICAL DATA:  Pelvic mass, unspecified abdominal pain   EXAM: TRANSABDOMINAL AND TRANSVAGINAL ULTRASOUND OF PELVIS   DOPPLER ULTRASOUND OF OVARIES   TECHNIQUE: Both transabdominal and transvaginal ultrasound examinations of the pelvis were performed. Transabdominal technique was performed for global imaging of the pelvis including uterus, ovaries, adnexal regions, and pelvic cul-de-sac.   It was necessary to proceed with endovaginal exam following the transabdominal exam to visualize the ovaries bilaterally. Color and duplex Doppler ultrasound was utilized to evaluate blood flow to the ovaries.   COMPARISON:  CT 12/20/2020   FINDINGS: Uterus   Absent   Endometrium   Not applicable   Right ovary   Measurements: 9.7 x 5.2 x 6.6 cm = volume: 174 mL. The right ovary is essentially replaced by a complex heterogeneously hyper and hypoechoic partially centrally cystic mass measuring roughly 9.4 x 5.5 x 7.8 cm in greatest dimension. While the imaging characteristics on this examination are  nonspecific, the prior CT examination demonstrates macroscopic fat and this is compatible with an ovarian dermoid. No internal vascularity is identified.   Left ovary   Not visualized   Pulsed Doppler evaluation of the visualized right ovary demonstrates essentially no significant vascularity   Other findings   No abnormal free fluid.   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 While no internal vascularity is identified, these lesions typically demonstrate no significant internal vascularity and torsion of the lesion is difficult to exclude by imaging alone. There are no secondary signs such as perilesional edema or free fluid within the pelvis, however.   Nonvisualization of the left ovary.   Status post hysterectomy.    CLINICAL DATA:  Flank pain, kidney stone suspected. right shoulder and right-sided abdominal pain   EXAM: CT ABDOMEN AND PELVIS WITHOUT CONTRAST   TECHNIQUE: Multidetector CT imaging of the abdomen and pelvis was performed following the standard protocol without IV contrast.   COMPARISON:  None.   FINDINGS: Lower chest: No acute abnormality.   Hepatobiliary: No focal liver abnormality. No gallstones, gallbladder wall thickening, or pericholecystic fluid. No biliary dilatation.   Pancreas: No focal lesion. Normal pancreatic contour. No surrounding inflammatory changes. No main pancreatic ductal dilatation.   Spleen: Normal in size without focal abnormality.   Adrenals/Urinary Tract:   No adrenal nodule bilaterally.   Bilateral kidneys enhance symmetrically.   No hydronephrosis. No hydroureter.   The urinary bladder is unremarkable.   Stomach/Bowel: Stomach is within normal limits. No evidence of bowel wall thickening or  dilatation. The appendix not definitely identified.   Vascular/Lymphatic: No abdominal aorta or iliac aneurysm. Mild atherosclerotic plaque of the aorta and its branches. No  abdominal, pelvic, or inguinal lymphadenopathy.   Reproductive: Status post hysterectomy. There is a 10 x 7 x 7 cm heterogeneous mass with fat, fluid, soft tissue, calcification densities within the right ovary. The left ovary is unremarkable. The left adnexal region is unremarkable.   Other: No intraperitoneal free fluid. No intraperitoneal free gas. No organized fluid collection.   Musculoskeletal:   No abdominal wall hernia or abnormality.   No suspicious lytic or blastic osseous lesions. No acute displaced fracture. L5-S1 intervertebral disc space vacuum phenomenon and endplate sclerosis.   IMPRESSION: 1. 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 Associated ovarian torsion is not excluded. Recommend gynecologic consultation. 2.  Aortic Atherosclerosis (ICD10-I70.0).       Assessment & Plan:   1. Dermoid cyst of right ovary Plan for exploratory laparotomy with right salpingo-oophorectomy.  This may be the patient's only remaining ovary, and she could possibly need HRT after removal.  Pt will need pulmonary clearance due to recent diagnosis of emphysema.  Pt is aware.  2. Pelvic pain Rx for naprosyn and vicodin sent for pain control until procedure.    Griffin Basil, MD Faculty Attending, [facility]

## 2020-12-30 ENCOUNTER — Ambulatory Visit: Payer: Self-pay | Admitting: Adult Health

## 2020-12-30 ENCOUNTER — Encounter: Payer: Self-pay | Admitting: Family Medicine

## 2020-12-31 ENCOUNTER — Telehealth: Payer: Self-pay | Admitting: *Deleted

## 2020-12-31 NOTE — Telephone Encounter (Signed)
Called and spoke with patient, advised her that she did not need to have another OV since she was seen by Dr. Lamonte Sakai on 12/23/20 and that the surgical risk was added to the summary from her last visit.  She verbalized understanding. Appointment canceled for 01/03/21.  Nothing further needed.

## 2020-12-31 NOTE — Telephone Encounter (Signed)
ATC patient to let her know that she does not need to see TP on Monday 01/03/21 for surgical clearance.  Since she was seen by Dr. Lamonte Sakai on 12/23/20, he just added an addendum to that visit with the surgical risk.  I left a detailed vm making the patient aware and advising the patient to call with any questions.

## 2021-01-03 ENCOUNTER — Ambulatory Visit: Payer: 59 | Admitting: Adult Health

## 2021-01-04 LAB — ALPHA-1 ANTITRYPSIN PHENOTYPE: A-1 Antitrypsin, Ser: 181 mg/dL (ref 83–199)

## 2021-01-10 ENCOUNTER — Other Ambulatory Visit: Payer: Self-pay

## 2021-01-10 ENCOUNTER — Ambulatory Visit (INDEPENDENT_AMBULATORY_CARE_PROVIDER_SITE_OTHER): Payer: Self-pay | Admitting: Obstetrics and Gynecology

## 2021-01-10 VITALS — BP 137/93 | HR 92 | Wt 145.9 lb

## 2021-01-10 DIAGNOSIS — J432 Centrilobular emphysema: Secondary | ICD-10-CM

## 2021-01-10 DIAGNOSIS — D270 Benign neoplasm of right ovary: Secondary | ICD-10-CM

## 2021-01-10 DIAGNOSIS — Z01818 Encounter for other preprocedural examination: Secondary | ICD-10-CM

## 2021-01-10 NOTE — H&P (View-Only) (Signed)
OB/GYN Pre-Op History and Physical  Laura Steele is a 45 y.o. [phone] presenting for preoperative visit for exploratory laparotomy and right salpingo-oophorectomy.  Ultrasound shows little remaining viable ovary.  Pt understands the ovary will be removed and she will likely need HRT after the procedure.  Risks and benefits of the procedure have been given including bleeding, infection, and involvement of other organs including bladder and bowel.  She has received preliminary clearance from her pulmonologist for her surgery.  See 12/23/20 note.      Past Medical History:  Diagnosis Date   Allergy    Anxiety    Asthma    Headache    Migraines   Hypoglycemia    Hypothyroidism    Pneumonia     Past Surgical History:  Procedure Laterality Date   ABDOMINAL HYSTERECTOMY     ANTERIOR CERVICAL DECOMP/DISCECTOMY FUSION N/A 05/19/2020   Procedure: ANTERIOR CERVICAL DECOMPRESSION FUSION CERVICAL 5 - CERVICAL 6 WITH INSTRUMENTATION AND ALLOGRAFT;  Surgeon: Phylliss Bob, MD;  Location: [facility];  Service: Orthopedics;  Laterality: N/A;   ANTERIOR CERVICAL DISCECTOMY  03/2017   C6-7; Dr. Christella Noa    OB History  Gravida Para Term Preterm AB Living  3 3 2 1   3   SAB IAB Ectopic Multiple Live Births               # Outcome Date GA Lbr Len/2nd Weight Sex Delivery Anes PTL Lv  3 Preterm           2 Term           1 Term             Social History   Socioeconomic History   Marital status: Married    Spouse name: Not on file   Number of children: Not on file   Years of education: Not on file   Highest education level: Not on file  Occupational History   Not on file  Tobacco Use   Smoking status: Every Day    Packs/day: 1.00    Types: Cigarettes   Smokeless tobacco: Never   Tobacco comments:    0.5 packs smoked per day [facility] 12/23/20  Vaping Use   Vaping Use: Never used  Substance and Sexual Activity   Alcohol use: Yes    Comment: Pt states was drinkning 40 0z of beer a night. but  stopped since sunday.   Drug use: No   Sexual activity: Yes  Other Topics Concern   Not on file  Social History Narrative   Not on file   Social Determinants of Health   Financial Resource Strain: Not on file  Food Insecurity: Not on file  Transportation Needs: Not on file  Physical Activity: Not on file  Stress: Not on file  Social Connections: Not on file    Family History  Problem Relation Age of Onset   Diabetes Mother    Breast cancer Mother        had masectomy.    Skin cancer Maternal Grandfather     (Not in a hospital admission)   No Known Allergies  Review of Systems: Negative except for what is mentioned in HPI.     Physical Exam: BP (!) 137/93   Pulse 92   Wt 145 lb 14.4 oz (66.2 kg)   BMI 26.69 kg/m  CONSTITUTIONAL: Well-developed, well-nourished female in no acute distress.  HENT:  Normocephalic, atraumatic, External right and left ear normal. Oropharynx is clear and moist EYES:  Conjunctivae and EOM are normal. Pupils are equal, round, and reactive to light. No scleral icterus.  NECK: Normal range of motion, supple, no masses SKIN: Skin is warm and dry. No rash noted. Not diaphoretic. No erythema. No pallor. [place]: Alert and oriented to person, place, and time. Normal reflexes, muscle tone coordination. No cranial nerve deficit noted. PSYCHIATRIC: Normal mood and affect. Normal behavior. Normal judgment and thought content. CARDIOVASCULAR: Normal heart rate noted, regular rhythm RESPIRATORY: Effort and breath sounds normal, no problems with respiration noted ABDOMEN: Soft, mildly tender, nondistended, well-healed Pfannenstiel incision. PELVIC: Deferred MUSCULOSKELETAL: Normal range of motion. No edema and no tenderness. 2+ distal pulses.   Pertinent Labs/Studies:   CLINICAL DATA:  Pelvic mass, unspecified abdominal pain   EXAM: TRANSABDOMINAL AND TRANSVAGINAL ULTRASOUND OF PELVIS   DOPPLER ULTRASOUND OF OVARIES   TECHNIQUE: Both  transabdominal and transvaginal ultrasound examinations of the pelvis were performed. Transabdominal technique was performed for global imaging of the pelvis including uterus, ovaries, adnexal regions, and pelvic cul-de-sac.   It was necessary to proceed with endovaginal exam following the transabdominal exam to visualize the ovaries bilaterally. Color and duplex Doppler ultrasound was utilized to evaluate blood flow to the ovaries.   COMPARISON:  CT 12/20/2020   FINDINGS: Uterus   Absent   Endometrium   Not applicable   Right ovary   Measurements: 9.7 x 5.2 x 6.6 cm = volume: 174 mL. The right ovary is essentially replaced by a complex heterogeneously hyper and hypoechoic partially centrally cystic mass measuring roughly 9.4 x 5.5 x 7.8 cm in greatest dimension. While the imaging characteristics on this examination are nonspecific, the prior CT examination demonstrates macroscopic fat and this is compatible with an ovarian dermoid. No internal vascularity is identified.   Left ovary   Not visualized   Pulsed Doppler evaluation of the visualized right ovary demonstrates essentially no significant vascularity   Other findings   No abnormal free fluid.   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 While no internal vascularity is identified, these lesions typically demonstrate no significant internal vascularity and torsion of the lesion is difficult to exclude by imaging alone. There are no secondary signs such as perilesional edema or free fluid within the pelvis, however.   Nonvisualization of the left ovary.   Status post hysterectomy.     Assessment and Plan :Laura Steele is a 45 y.o. [phone] here for preoperative evaluation for exploratory laparotomy and RSO.   Plan for ex lap with RSO NPO Admission labs ordered Pt advised to remove all piercings prior to surgery.   Lynnda Shields, M.D. Attending [place], [facility] for [employer], [place]

## 2021-01-10 NOTE — Progress Notes (Signed)
OB/GYN Pre-Op History and Physical  Laura Steele is a 45 y.o. [phone] presenting for preoperative visit for exploratory laparotomy and right salpingo-oophorectomy.  Ultrasound shows little remaining viable ovary.  Pt understands the ovary will be removed and she will likely need HRT after the procedure.  Risks and benefits of the procedure have been given including bleeding, infection, and involvement of other organs including bladder and bowel.  She has received preliminary clearance from her pulmonologist for her surgery.  See 12/23/20 note.      Past Medical History:  Diagnosis Date   Allergy    Anxiety    Asthma    Headache    Migraines   Hypoglycemia    Hypothyroidism    Pneumonia     Past Surgical History:  Procedure Laterality Date   ABDOMINAL HYSTERECTOMY     ANTERIOR CERVICAL DECOMP/DISCECTOMY FUSION N/A 05/19/2020   Procedure: ANTERIOR CERVICAL DECOMPRESSION FUSION CERVICAL 5 - CERVICAL 6 WITH INSTRUMENTATION AND ALLOGRAFT;  Surgeon: Phylliss Bob, MD;  Location: [facility];  Service: Orthopedics;  Laterality: N/A;   ANTERIOR CERVICAL DISCECTOMY  03/2017   C6-7; Dr. Christella Noa    OB History  Gravida Para Term Preterm AB Living  3 3 2 1   3   SAB IAB Ectopic Multiple Live Births               # Outcome Date GA Lbr Len/2nd Weight Sex Delivery Anes PTL Lv  3 Preterm           2 Term           1 Term             Social History   Socioeconomic History   Marital status: Married    Spouse name: Not on file   Number of children: Not on file   Years of education: Not on file   Highest education level: Not on file  Occupational History   Not on file  Tobacco Use   Smoking status: Every Day    Packs/day: 1.00    Types: Cigarettes   Smokeless tobacco: Never   Tobacco comments:    0.5 packs smoked per day [facility] 12/23/20  Vaping Use   Vaping Use: Never used  Substance and Sexual Activity   Alcohol use: Yes    Comment: Pt states was drinkning 40 0z of beer a night. but  stopped since sunday.   Drug use: No   Sexual activity: Yes  Other Topics Concern   Not on file  Social History Narrative   Not on file   Social Determinants of Health   Financial Resource Strain: Not on file  Food Insecurity: Not on file  Transportation Needs: Not on file  Physical Activity: Not on file  Stress: Not on file  Social Connections: Not on file    Family History  Problem Relation Age of Onset   Diabetes Mother    Breast cancer Mother        had masectomy.    Skin cancer Maternal Grandfather     (Not in a hospital admission)   No Known Allergies  Review of Systems: Negative except for what is mentioned in HPI.     Physical Exam: BP (!) 137/93   Pulse 92   Wt 145 lb 14.4 oz (66.2 kg)   BMI 26.69 kg/m  CONSTITUTIONAL: Well-developed, well-nourished female in no acute distress.  HENT:  Normocephalic, atraumatic, External right and left ear normal. Oropharynx is clear and moist EYES:  Conjunctivae and EOM are normal. Pupils are equal, round, and reactive to light. No scleral icterus.  NECK: Normal range of motion, supple, no masses SKIN: Skin is warm and dry. No rash noted. Not diaphoretic. No erythema. No pallor. [place]: Alert and oriented to person, place, and time. Normal reflexes, muscle tone coordination. No cranial nerve deficit noted. PSYCHIATRIC: Normal mood and affect. Normal behavior. Normal judgment and thought content. CARDIOVASCULAR: Normal heart rate noted, regular rhythm RESPIRATORY: Effort and breath sounds normal, no problems with respiration noted ABDOMEN: Soft, mildly tender, nondistended, well-healed Pfannenstiel incision. PELVIC: Deferred MUSCULOSKELETAL: Normal range of motion. No edema and no tenderness. 2+ distal pulses.   Pertinent Labs/Studies:   CLINICAL DATA:  Pelvic mass, unspecified abdominal pain   EXAM: TRANSABDOMINAL AND TRANSVAGINAL ULTRASOUND OF PELVIS   DOPPLER ULTRASOUND OF OVARIES   TECHNIQUE: Both  transabdominal and transvaginal ultrasound examinations of the pelvis were performed. Transabdominal technique was performed for global imaging of the pelvis including uterus, ovaries, adnexal regions, and pelvic cul-de-sac.   It was necessary to proceed with endovaginal exam following the transabdominal exam to visualize the ovaries bilaterally. Color and duplex Doppler ultrasound was utilized to evaluate blood flow to the ovaries.   COMPARISON:  CT 12/20/2020   FINDINGS: Uterus   Absent   Endometrium   Not applicable   Right ovary   Measurements: 9.7 x 5.2 x 6.6 cm = volume: 174 mL. The right ovary is essentially replaced by a complex heterogeneously hyper and hypoechoic partially centrally cystic mass measuring roughly 9.4 x 5.5 x 7.8 cm in greatest dimension. While the imaging characteristics on this examination are nonspecific, the prior CT examination demonstrates macroscopic fat and this is compatible with an ovarian dermoid. No internal vascularity is identified.   Left ovary   Not visualized   Pulsed Doppler evaluation of the visualized right ovary demonstrates essentially no significant vascularity   Other findings   No abnormal free fluid.   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 While no internal vascularity is identified, these lesions typically demonstrate no significant internal vascularity and torsion of the lesion is difficult to exclude by imaging alone. There are no secondary signs such as perilesional edema or free fluid within the pelvis, however.   Nonvisualization of the left ovary.   Status post hysterectomy.     Assessment and Plan :Laura Steele is a 45 y.o. [phone] here for preoperative evaluation for exploratory laparotomy and RSO.   Plan for ex lap with RSO NPO Admission labs ordered Pt advised to remove all piercings prior to surgery.   Lynnda Shields, M.D. Attending [place], [facility] for [employer], [place]

## 2021-01-13 NOTE — Progress Notes (Signed)
Surgical Instructions    Your procedure is scheduled on Tuesday, October 25th, 2022.   Report to [facility] Main Entrance "A" at 11:15 A.M., then check in with the Admitting office.  Call this number if you have problems the morning of surgery:  [phone]   If you have any questions prior to your surgery date call [phone]: Open Monday-Friday 8am-4pm    Remember:  Do not eat or drink after midnight the night before your surgery   Take these medicines the morning of surgery with A SIP OF WATER:  glycopyrrolate (ROBINUL) levothyroxine (SYNTHROID) pregabalin (LYRICA)   If needed:  HYDROcodone-acetaminophen (NORCO/VICODIN) tizanidine (ZANAFLEX) albuterol (VENTOLIN HFA) - please, bring the inhaler with you the day of surgery  As of today, STOP taking any Aspirin (unless otherwise instructed by your surgeon) Aleve, Naproxen, Ibuprofen, Motrin, Advil, Goody's, BC's, all herbal medications, fish oil, and all vitamins.   After your COVID test   You are not required to quarantine however you are required to wear a well-fitting mask when you are out and around people not in your household.  If your mask becomes wet or soiled, replace with a new one.  Wash your hands often with soap and water for 20 seconds or clean your hands with an alcohol-based hand sanitizer that contains at least 60% alcohol.  Do not share personal items.  Notify your provider: if you are in close contact with someone who has COVID  or if you develop a fever of 100.4 or greater, sneezing, cough, sore throat, shortness of breath or body aches.    The day of surgery:          Do not wear jewelry or makeup Do not wear lotions, powders, perfumes, or deodorant. Do not shave 48 hours prior to surgery.   Do not bring valuables to the hospital. DO Not wear nail polish, gel polish, artificial nails, or any other type of covering on natural nails including finger and toenails. If patients have artificial  nails, gel coating, etc. that need to be removed by a nail salon, please have this removed prior to surgery or surgery may need to be canceled/delayed if the surgeon/ anesthesia feels like the patient is unable to be adequately monitored.              [facility] is not responsible for any belongings or valuables.  Do NOT Smoke (Tobacco/Vaping)  24 hours prior to your procedure  If you use a CPAP at night, you may bring your mask for your overnight stay.   Contacts, glasses, hearing aids, dentures or partials may not be worn into surgery, please bring cases for these belongings   For patients admitted to the hospital, discharge time will be determined by your treatment team.   Patients discharged the day of surgery will not be allowed to drive home, and someone needs to stay with them for 24 hours.  NO VISITORS WILL BE ALLOWED IN PRE-OP WHERE PATIENTS ARE PREPPED FOR SURGERY.  ONLY 1 SUPPORT PERSON MAY BE PRESENT IN THE WAITING ROOM WHILE YOU ARE IN SURGERY.  IF YOU ARE TO BE ADMITTED, ONCE YOU ARE IN YOUR ROOM YOU WILL BE ALLOWED TWO (2) VISITORS. 1 (ONE) VISITOR MAY STAY OVERNIGHT BUT MUST ARRIVE TO THE ROOM BY 8pm.  Minor children may have two parents present. Special consideration for safety and communication needs will be reviewed on a case by case basis.  Special instructions:    Oral Hygiene is also important  to reduce your risk of infection.  Remember - BRUSH YOUR TEETH THE MORNING OF SURGERY WITH YOUR REGULAR TOOTHPASTE   [facility]- Preparing For Surgery  Before surgery, you can play an important role. Because skin is not sterile, your skin needs to be as free of germs as possible. You can reduce the number of germs on your skin by washing with CHG (chlorahexidine gluconate) Soap before surgery.  CHG is an antiseptic cleaner which kills germs and bonds with the skin to continue killing germs even after washing.     Please do not use if you have an allergy to CHG or antibacterial  soaps. If your skin becomes reddened/irritated stop using the CHG.  Do not shave (including legs and underarms) for at least 48 hours prior to first CHG shower. It is OK to shave your face.  Please follow these instructions carefully.     Shower the NIGHT BEFORE SURGERY and the MORNING OF SURGERY with CHG Soap.   If you chose to wash your hair, wash your hair first as usual with your normal shampoo. After you shampoo, rinse your hair and body thoroughly to remove the shampoo.  Then [employer] and genitals (private parts) with your normal soap and rinse thoroughly to remove soap.  After that Use CHG Soap as you would any other liquid soap. You can apply CHG directly to the skin and wash gently with a scrungie or a clean washcloth.   Apply the CHG Soap to your body ONLY FROM THE NECK DOWN.  Do not use on open wounds or open sores. Avoid contact with your eyes, ears, mouth and genitals (private parts). Wash Face and genitals (private parts)  with your normal soap.   Wash thoroughly, paying special attention to the area where your surgery will be performed.  Thoroughly rinse your body with warm water from the neck down.  DO NOT shower/wash with your normal soap after using and rinsing off the CHG Soap.  Pat yourself dry with a CLEAN TOWEL.  Wear CLEAN PAJAMAS to bed the night before surgery  Place CLEAN SHEETS on your bed the night before your surgery  DO NOT SLEEP WITH PETS.   Day of Surgery:  Take a shower with CHG soap. Wear Clean/Comfortable clothing the morning of surgery Do not apply any deodorants/lotions.   Remember to brush your teeth WITH YOUR REGULAR TOOTHPASTE.   Please read over the following fact sheets that you were given.

## 2021-01-14 ENCOUNTER — Encounter (HOSPITAL_COMMUNITY): Payer: Self-pay

## 2021-01-14 ENCOUNTER — Other Ambulatory Visit: Payer: Self-pay

## 2021-01-14 ENCOUNTER — Encounter (HOSPITAL_COMMUNITY)
Admission: RE | Admit: 2021-01-14 | Discharge: 2021-01-14 | Disposition: A | Discharge status: Home / Self Care | Payer: 59 | Source: Ambulatory Visit | Attending: Obstetrics and Gynecology | Admitting: Obstetrics and Gynecology

## 2021-01-14 ENCOUNTER — Other Ambulatory Visit: Payer: Self-pay | Admitting: Obstetrics and Gynecology

## 2021-01-14 VITALS — BP 146/98 | HR 94 | Temp 98.2°F | Resp 18 | Ht 63.0 in | Wt 144.0 lb

## 2021-01-14 DIAGNOSIS — Z01818 Encounter for other preprocedural examination: Secondary | ICD-10-CM | POA: Insufficient documentation

## 2021-01-14 DIAGNOSIS — R102 Pelvic and perineal pain: Secondary | ICD-10-CM

## 2021-01-14 DIAGNOSIS — D270 Benign neoplasm of right ovary: Secondary | ICD-10-CM

## 2021-01-14 DIAGNOSIS — Z20822 Contact with and (suspected) exposure to covid-19: Secondary | ICD-10-CM | POA: Insufficient documentation

## 2021-01-14 HISTORY — DX: Chronic obstructive pulmonary disease, unspecified: J44.9

## 2021-01-14 LAB — BASIC METABOLIC PANEL
Anion gap: 9 (ref 5–15)
BUN: 5 mg/dL — ABNORMAL LOW (ref 6–20)
CO2: 22 mmol/L (ref 22–32)
Calcium: 8.6 mg/dL — ABNORMAL LOW (ref 8.9–10.3)
Chloride: 103 mmol/L (ref 98–111)
Creatinine, Ser: 0.56 mg/dL (ref 0.44–1.00)
GFR, Estimated: >60 mL/min (ref >60)
Glucose, Bld: 92 mg/dL (ref 70–99)
Potassium: 3.4 mmol/L — ABNORMAL LOW (ref 3.5–5.1)
Sodium: 134 mmol/L — ABNORMAL LOW (ref 135–145)

## 2021-01-14 LAB — TYPE AND SCREEN
ABO/RH(D): A POS
Antibody Screen: NEGATIVE

## 2021-01-14 LAB — CBC
HCT: 50.8 % — ABNORMAL HIGH (ref 36.0–46.0)
Hemoglobin: 16.9 g/dL — ABNORMAL HIGH (ref 12.0–15.0)
MCH: 32.8 pg (ref 26.0–34.0)
MCHC: 33.3 g/dL (ref 30.0–36.0)
MCV: 98.6 fL (ref 80.0–100.0)
Platelets: 437 10*3/uL — ABNORMAL HIGH (ref 150–400)
RBC: 5.15 MIL/uL — ABNORMAL HIGH (ref 3.87–5.11)
RDW: 13.3 % (ref 11.5–15.5)
WBC: 14.7 10*3/uL — ABNORMAL HIGH (ref 4.0–10.5)
nRBC: 0 % (ref 0.0–0.2)

## 2021-01-14 LAB — SARS CORONAVIRUS 2 (TAT 6-24 HRS): SARS Coronavirus 2: NEGATIVE

## 2021-01-14 NOTE — Progress Notes (Signed)
PCP - Howard Pouch, DO Cardiologist - denies Pulmonologist - Baltazar Apo, MD  PPM/ICD - denies Device Orders - n/a Rep Notified - n/a  Chest x-ray - n/a EKG - n/a Stress Test - denies ECHO - denies Cardiac Cath - denies  Sleep Study - denies CPAP - n/a  Fasting Blood Sugar - n/a  Blood Thinner Instructions: n/a  Aspirin Instructions: Patient was instructed: As of today, STOP taking any Aspirin (unless otherwise instructed by your surgeon) Aleve, Naproxen, Ibuprofen, Motrin, Advil, Goody's, BC's, all herbal medications, fish oil, and all vitamins  ERAS Protcol - n/a  COVID TEST- yes, done in PAT 01/14/2021   Anesthesia review: yes - COPD - clearance Dr. Lamonte Sakai  Patient denies shortness of breath, fever, cough and chest pain at PAT appointment   All instructions explained to the patient, with a verbal understanding of the material. Patient agrees to go over the instructions while at home for a better understanding. Patient also instructed to self quarantine after being tested for COVID-19. The opportunity to ask questions was provided.

## 2021-01-14 NOTE — Progress Notes (Signed)
Abnormal lab in PAT: [facility] 50.8. Dr. Elgie Congo office was called and left a message for RN to call back. Dr. Elgie Congo was notified via [place].

## 2021-01-18 ENCOUNTER — Inpatient Hospital Stay (HOSPITAL_COMMUNITY)
Admission: EM | Admit: 2021-01-18 | Discharge: 2021-01-19 | DRG: 743 | Disposition: A | Discharge status: Home / Self Care | Payer: Self-pay | Attending: Obstetrics and Gynecology | Admitting: Obstetrics and Gynecology

## 2021-01-18 ENCOUNTER — Other Ambulatory Visit: Payer: Self-pay

## 2021-01-18 ENCOUNTER — Inpatient Hospital Stay (HOSPITAL_COMMUNITY): Payer: Self-pay | Admitting: Physician Assistant

## 2021-01-18 ENCOUNTER — Encounter (HOSPITAL_COMMUNITY)
Admission: EM | Disposition: A | Discharge status: Home / Self Care | Payer: Self-pay | Source: Home / Self Care | Attending: Obstetrics and Gynecology

## 2021-01-18 ENCOUNTER — Encounter (HOSPITAL_COMMUNITY): Payer: Self-pay | Admitting: Obstetrics and Gynecology

## 2021-01-18 ENCOUNTER — Inpatient Hospital Stay (HOSPITAL_COMMUNITY): Payer: Self-pay | Admitting: Anesthesiology

## 2021-01-18 DIAGNOSIS — Z79899 Other long term (current) drug therapy: Secondary | ICD-10-CM

## 2021-01-18 DIAGNOSIS — Z803 Family history of malignant neoplasm of breast: Secondary | ICD-10-CM

## 2021-01-18 DIAGNOSIS — Z833 Family history of diabetes mellitus: Secondary | ICD-10-CM

## 2021-01-18 DIAGNOSIS — G8929 Other chronic pain: Secondary | ICD-10-CM

## 2021-01-18 DIAGNOSIS — Z79890 Hormone replacement therapy (postmenopausal): Secondary | ICD-10-CM

## 2021-01-18 DIAGNOSIS — E039 Hypothyroidism, unspecified: Secondary | ICD-10-CM | POA: Diagnosis present

## 2021-01-18 DIAGNOSIS — Z808 Family history of malignant neoplasm of other organs or systems: Secondary | ICD-10-CM

## 2021-01-18 DIAGNOSIS — F17210 Nicotine dependence, cigarettes, uncomplicated: Secondary | ICD-10-CM | POA: Diagnosis present

## 2021-01-18 DIAGNOSIS — D270 Benign neoplasm of right ovary: Principal | ICD-10-CM | POA: Diagnosis present

## 2021-01-18 HISTORY — PX: SALPINGOOPHORECTOMY: SHX82

## 2021-01-18 HISTORY — PX: LAPAROTOMY: SHX154

## 2021-01-18 LAB — CBC
HCT: 45.3 % (ref 36.0–46.0)
Hemoglobin: 15.1 g/dL — ABNORMAL HIGH (ref 12.0–15.0)
MCH: 32.8 pg (ref 26.0–34.0)
MCHC: 33.3 g/dL (ref 30.0–36.0)
MCV: 98.3 fL (ref 80.0–100.0)
Platelets: 419 10*3/uL — ABNORMAL HIGH (ref 150–400)
RBC: 4.61 MIL/uL (ref 3.87–5.11)
RDW: 13.2 % (ref 11.5–15.5)
WBC: 24.1 10*3/uL — ABNORMAL HIGH (ref 4.0–10.5)
nRBC: 0 % (ref 0.0–0.2)

## 2021-01-18 LAB — CREATININE, SERUM
Creatinine, Ser: 0.57 mg/dL (ref 0.44–1.00)
GFR, Estimated: >60 mL/min (ref >60)

## 2021-01-18 SURGERY — LAPAROTOMY, EXPLORATORY
Anesthesia: General | Site: Abdomen | Laterality: Right

## 2021-01-18 MED ORDER — LACTATED RINGERS IV SOLN: INTRAVENOUS | Status: DC

## 2021-01-18 MED ORDER — MIDAZOLAM HCL 2 MG/2ML IJ SOLN
INTRAMUSCULAR | Status: DC | PRN
Start: 2021-01-18 — End: 2021-01-18
  Administered 2021-01-18: 2 mg via INTRAVENOUS

## 2021-01-18 MED ORDER — KETOROLAC TROMETHAMINE 30 MG/ML IJ SOLN
30.0000 mg | Freq: Four times a day (QID) | INTRAMUSCULAR | Status: AC
  Administered 2021-01-18 – 2021-01-19 (×3): 30 mg via INTRAVENOUS
  Filled 2021-01-18 (×3): qty 1

## 2021-01-18 MED ORDER — FENTANYL CITRATE (PF) 100 MCG/2ML IJ SOLN
INTRAMUSCULAR | Status: AC
  Administered 2021-01-18: 100 ug via INTRAVENOUS
  Filled 2021-01-18: qty 2

## 2021-01-18 MED ORDER — HYDROMORPHONE HCL 1 MG/ML IJ SOLN
INTRAMUSCULAR | Status: AC
  Filled 2021-01-18: qty 1

## 2021-01-18 MED ORDER — LIDOCAINE 2% (20 MG/ML) 5 ML SYRINGE
INTRAMUSCULAR | Status: AC
  Filled 2021-01-18: qty 5

## 2021-01-18 MED ORDER — ACETAMINOPHEN 325 MG PO TABS: 650.0000 mg | ORAL_TABLET | ORAL | Status: DC | PRN

## 2021-01-18 MED ORDER — ACETAMINOPHEN 500 MG PO TABS: 1000.0000 mg | ORAL_TABLET | ORAL | Status: AC

## 2021-01-18 MED ORDER — CHLORHEXIDINE GLUCONATE 0.12 % MT SOLN
OROMUCOSAL | Status: AC
  Administered 2021-01-18: 15 mL via OROMUCOSAL
  Filled 2021-01-18: qty 15

## 2021-01-18 MED ORDER — PHENYLEPHRINE 40 MCG/ML (10ML) SYRINGE FOR IV PUSH (FOR BLOOD PRESSURE SUPPORT)
PREFILLED_SYRINGE | INTRAVENOUS | Status: AC
  Filled 2021-01-18: qty 10

## 2021-01-18 MED ORDER — SOD CITRATE-CITRIC ACID 500-334 MG/5ML PO SOLN
ORAL | Status: AC
  Administered 2021-01-18: 30 mL via ORAL
  Filled 2021-01-18: qty 30

## 2021-01-18 MED ORDER — CHLORHEXIDINE GLUCONATE 0.12 % MT SOLN
15.0000 mL | OROMUCOSAL | Status: AC
  Filled 2021-01-18: qty 15

## 2021-01-18 MED ORDER — FENTANYL CITRATE (PF) 100 MCG/2ML IJ SOLN: 100.0000 ug | Freq: Once | INTRAMUSCULAR | Status: AC

## 2021-01-18 MED ORDER — DEXAMETHASONE SODIUM PHOSPHATE 10 MG/ML IJ SOLN
INTRAMUSCULAR | Status: AC
  Filled 2021-01-18: qty 1

## 2021-01-18 MED ORDER — ONDANSETRON HCL 4 MG PO TABS: 4.0000 mg | ORAL_TABLET | Freq: Four times a day (QID) | ORAL | Status: DC | PRN

## 2021-01-18 MED ORDER — PROPOFOL 10 MG/ML IV BOLUS
INTRAVENOUS | Status: DC | PRN
  Administered 2021-01-18: 150 mg via INTRAVENOUS

## 2021-01-18 MED ORDER — DEXAMETHASONE SODIUM PHOSPHATE 10 MG/ML IJ SOLN
INTRAMUSCULAR | Status: DC | PRN
  Administered 2021-01-18: 8 mg via INTRAVENOUS

## 2021-01-18 MED ORDER — MIDAZOLAM HCL 2 MG/2ML IJ SOLN
INTRAMUSCULAR | Status: AC
  Filled 2021-01-18: qty 2

## 2021-01-18 MED ORDER — BUPIVACAINE HCL (PF) 0.5 % IJ SOLN
INTRAMUSCULAR | Status: DC | PRN
  Administered 2021-01-18: 30 mL

## 2021-01-18 MED ORDER — SUGAMMADEX SODIUM 200 MG/2ML IV SOLN
INTRAVENOUS | Status: DC | PRN
  Administered 2021-01-18: 200 mg via INTRAVENOUS

## 2021-01-18 MED ORDER — SOD CITRATE-CITRIC ACID 500-334 MG/5ML PO SOLN
30.0000 mL | ORAL | Status: AC
Start: 2021-01-18 — End: 2021-01-18

## 2021-01-18 MED ORDER — ONDANSETRON HCL 4 MG/2ML IJ SOLN: 4.0000 mg | Freq: Four times a day (QID) | INTRAMUSCULAR | Status: DC | PRN

## 2021-01-18 MED ORDER — SENNOSIDES-DOCUSATE SODIUM 8.6-50 MG PO TABS
1.0000 | ORAL_TABLET | Freq: Every evening | ORAL | Status: DC | PRN
  Filled 2021-01-18: qty 1

## 2021-01-18 MED ORDER — DOCUSATE SODIUM 100 MG PO CAPS
100.0000 mg | ORAL_CAPSULE | Freq: Two times a day (BID) | ORAL | Status: DC
  Administered 2021-01-18 – 2021-01-19 (×2): 100 mg via ORAL
  Filled 2021-01-18 (×2): qty 1

## 2021-01-18 MED ORDER — KETOROLAC TROMETHAMINE 30 MG/ML IJ SOLN
30.0000 mg | Freq: Once | INTRAMUSCULAR | Status: AC | PRN
  Administered 2021-01-18: 30 mg via INTRAVENOUS

## 2021-01-18 MED ORDER — KETOROLAC TROMETHAMINE 30 MG/ML IJ SOLN
30.0000 mg | Freq: Four times a day (QID) | INTRAMUSCULAR | Status: AC
Start: 2021-01-18 — End: 2021-01-19

## 2021-01-18 MED ORDER — PROMETHAZINE HCL 25 MG/ML IJ SOLN: 6.2500 mg | INTRAMUSCULAR | Status: DC | PRN

## 2021-01-18 MED ORDER — ONDANSETRON HCL 4 MG/2ML IJ SOLN
INTRAMUSCULAR | Status: AC
  Filled 2021-01-18: qty 2

## 2021-01-18 MED ORDER — ENOXAPARIN SODIUM 40 MG/0.4ML IJ SOSY: 40.0000 mg | PREFILLED_SYRINGE | INTRAMUSCULAR | Status: DC

## 2021-01-18 MED ORDER — CEFAZOLIN SODIUM-DEXTROSE 2-4 GM/100ML-% IV SOLN
INTRAVENOUS | Status: AC
  Filled 2021-01-18: qty 100

## 2021-01-18 MED ORDER — KETOROLAC TROMETHAMINE 30 MG/ML IJ SOLN
INTRAMUSCULAR | Status: AC
  Filled 2021-01-18: qty 1

## 2021-01-18 MED ORDER — ROCURONIUM BROMIDE 10 MG/ML (PF) SYRINGE
PREFILLED_SYRINGE | INTRAVENOUS | Status: DC | PRN
Start: 2021-01-18 — End: 2021-01-18
  Administered 2021-01-18: 70 mg via INTRAVENOUS

## 2021-01-18 MED ORDER — POVIDONE-IODINE 10 % EX SWAB
2.0000 "application " | Freq: Once | CUTANEOUS | Status: AC
  Administered 2021-01-18: 2 via TOPICAL

## 2021-01-18 MED ORDER — 0.9 % SODIUM CHLORIDE (POUR BTL) OPTIME
TOPICAL | Status: DC | PRN
  Administered 2021-01-18: 1000 mL

## 2021-01-18 MED ORDER — OXYCODONE HCL 5 MG PO TABS
5.0000 mg | ORAL_TABLET | ORAL | Status: DC | PRN
  Administered 2021-01-18 – 2021-01-19 (×3): 10 mg via ORAL
  Administered 2021-01-19: 5 mg via ORAL
  Filled 2021-01-18 (×3): qty 2
  Filled 2021-01-18: qty 1

## 2021-01-18 MED ORDER — FENTANYL CITRATE (PF) 100 MCG/2ML IJ SOLN
INTRAMUSCULAR | Status: DC | PRN
  Administered 2021-01-18: 50 ug via INTRAVENOUS
  Administered 2021-01-18: 100 ug via INTRAVENOUS
  Administered 2021-01-18 (×2): 50 ug via INTRAVENOUS

## 2021-01-18 MED ORDER — ACETAMINOPHEN 500 MG PO TABS
ORAL_TABLET | ORAL | Status: AC
  Administered 2021-01-18: 1000 mg via ORAL
  Filled 2021-01-18: qty 2

## 2021-01-18 MED ORDER — CEFAZOLIN SODIUM-DEXTROSE 2-4 GM/100ML-% IV SOLN
2.0000 g | INTRAVENOUS | Status: AC
  Administered 2021-01-18: 2 g via INTRAVENOUS

## 2021-01-18 MED ORDER — HYDROMORPHONE HCL 1 MG/ML IJ SOLN
2.0000 mg | Freq: Once | INTRAMUSCULAR | Status: AC
  Administered 2021-01-18: 2 mg via INTRAVENOUS
  Filled 2021-01-18: qty 2

## 2021-01-18 MED ORDER — PHENYLEPHRINE 40 MCG/ML (10ML) SYRINGE FOR IV PUSH (FOR BLOOD PRESSURE SUPPORT)
PREFILLED_SYRINGE | INTRAVENOUS | Status: DC | PRN
  Administered 2021-01-18: 120 ug via INTRAVENOUS

## 2021-01-18 MED ORDER — PROPOFOL 10 MG/ML IV BOLUS
INTRAVENOUS | Status: AC
  Filled 2021-01-18: qty 20

## 2021-01-18 MED ORDER — LIDOCAINE 2% (20 MG/ML) 5 ML SYRINGE
INTRAMUSCULAR | Status: DC | PRN
  Administered 2021-01-18: 100 mg via INTRAVENOUS

## 2021-01-18 MED ORDER — HYDROMORPHONE HCL 1 MG/ML IJ SOLN
0.2500 mg | INTRAMUSCULAR | Status: DC | PRN
  Administered 2021-01-18 (×4): 0.5 mg via INTRAVENOUS

## 2021-01-18 MED ORDER — MEPERIDINE HCL 25 MG/ML IJ SOLN: 6.2500 mg | INTRAMUSCULAR | Status: DC | PRN

## 2021-01-18 MED ORDER — FENTANYL CITRATE (PF) 250 MCG/5ML IJ SOLN
INTRAMUSCULAR | Status: AC
  Filled 2021-01-18: qty 5

## 2021-01-18 MED ORDER — ONDANSETRON HCL 4 MG/2ML IJ SOLN
INTRAMUSCULAR | Status: DC | PRN
  Administered 2021-01-18: 4 mg via INTRAVENOUS

## 2021-01-18 MED ORDER — SIMETHICONE 80 MG PO CHEW
80.0000 mg | CHEWABLE_TABLET | Freq: Four times a day (QID) | ORAL | Status: DC | PRN
  Filled 2021-01-18: qty 1

## 2021-01-18 SURGICAL SUPPLY — 35 items
BAG COUNTER SPONGE SURGICOUNT (BAG) ×3 IMPLANT
CANISTER SUCT 3000ML PPV (MISCELLANEOUS) ×6 IMPLANT
CNTNR URN SCR LID CUP LEK RST (MISCELLANEOUS) ×2 IMPLANT
CONT SPEC 4OZ STRL OR WHT (MISCELLANEOUS) ×3
DRAPE WARM FLUID 44X44 (DRAPES) IMPLANT
DRSG OPSITE POSTOP 4X10 (GAUZE/BANDAGES/DRESSINGS) ×6 IMPLANT
DURAPREP 26ML APPLICATOR (WOUND CARE) ×3 IMPLANT
GAUZE 4X4 16PLY ~~LOC~~+RFID DBL (SPONGE) ×3 IMPLANT
GLOVE SURG ENC MOIS LTX SZ6.5 (GLOVE) ×3 IMPLANT
GLOVE SURG UNDER POLY LF SZ7 (GLOVE) ×9 IMPLANT
GOWN STRL REUS W/ TWL LRG LVL3 (GOWN DISPOSABLE) ×6 IMPLANT
GOWN STRL REUS W/TWL LRG LVL3 (GOWN DISPOSABLE) ×9
KIT TURNOVER KIT B (KITS) ×3 IMPLANT
NEEDLE HYPO 22GX1.5 SAFETY (NEEDLE) ×3 IMPLANT
NS IRRIG 1000ML POUR BTL (IV SOLUTION) ×3 IMPLANT
PACK ABDOMINAL GYN (CUSTOM PROCEDURE TRAY) ×3 IMPLANT
PAD ARMBOARD 7.5X6 YLW CONV (MISCELLANEOUS) ×3 IMPLANT
PAD OB MATERNITY 4.3X12.25 (PERSONAL CARE ITEMS) ×3 IMPLANT
PENCIL SMOKE EVAC W/HOLSTER (ELECTROSURGICAL) ×3 IMPLANT
SPONGE T-LAP 18X18 ~~LOC~~+RFID (SPONGE) ×3 IMPLANT
STRIP CLOSURE SKIN 1/2X4 (GAUZE/BANDAGES/DRESSINGS) ×3 IMPLANT
SUT PLAIN 2 0 XLH (SUTURE) IMPLANT
SUT VIC AB 0 CT1 18XCR BRD8 (SUTURE) ×2 IMPLANT
SUT VIC AB 0 CT1 27 (SUTURE)
SUT VIC AB 0 CT1 27XBRD ANBCTR (SUTURE) IMPLANT
SUT VIC AB 0 CT1 36 (SUTURE) ×6 IMPLANT
SUT VIC AB 0 CT1 8-18 (SUTURE) ×3
SUT VIC AB 2-0 CT1 27 (SUTURE) ×3
SUT VIC AB 2-0 CT1 TAPERPNT 27 (SUTURE) ×2 IMPLANT
SUT VIC AB 4-0 PS2 27 (SUTURE) ×3 IMPLANT
SUT VICRYL 0 TIES 12 18 (SUTURE) IMPLANT
SWABSTK COMLB BENZOIN TINCTURE (MISCELLANEOUS) ×3 IMPLANT
SYR CONTROL 10ML LL (SYRINGE) ×3 IMPLANT
TOWEL GREEN STERILE FF (TOWEL DISPOSABLE) ×6 IMPLANT
TRAY FOLEY W/BAG SLVR 14FR (SET/KITS/TRAYS/PACK) ×3 IMPLANT

## 2021-01-18 NOTE — Anesthesia Preprocedure Evaluation (Addendum)
Anesthesia Evaluation  Patient identified by MRN, date of birth, ID band Patient awake    Reviewed: Allergy & Precautions, NPO status , Patient's Chart, lab work & pertinent test results  Airway Mallampati: I       Dental no notable dental hx. (+)    Pulmonary asthma , COPD, Current Smoker,    Pulmonary exam normal        Cardiovascular negative cardio ROS Normal cardiovascular exam     Neuro/Psych  Headaches, PSYCHIATRIC DISORDERS Anxiety Depression    GI/Hepatic   Endo/Other  Hypothyroidism   Renal/GU   negative genitourinary   Musculoskeletal   Abdominal Normal abdominal exam  (+)   Peds  Hematology negative hematology ROS (+)   Anesthesia Other Findings   Reproductive/Obstetrics                            Anesthesia Physical Anesthesia Plan  ASA: 2  Anesthesia Plan: General   Post-op Pain Management:    Induction: Intravenous  PONV Risk Score and Plan: 4 or greater and Ondansetron, Dexamethasone and Midazolam  Airway Management Planned: Oral ETT  Additional Equipment: None  Intra-op Plan:   Post-operative Plan: Extubation in OR  Informed Consent: I have reviewed the patients History and Physical, chart, labs and discussed the procedure including the risks, benefits and alternatives for the proposed anesthesia with the patient or authorized representative who has indicated his/her understanding and acceptance.     Dental advisory given  Plan Discussed with: CRNA  Anesthesia Plan Comments:         Anesthesia Quick Evaluation

## 2021-01-18 NOTE — Anesthesia Procedure Notes (Signed)
Procedure Name: Intubation Date/Time: 01/18/2021 3:14 PM Performed by: Barrington Ellison, CRNA Pre-anesthesia Checklist: Patient identified, Emergency Drugs available, Suction available and Patient being monitored Patient Re-evaluated:Patient Re-evaluated prior to induction Oxygen Delivery Method: Circle System Utilized Preoxygenation: Pre-oxygenation with 100% oxygen Induction Type: IV induction Ventilation: Mask ventilation without difficulty Laryngoscope Size: Mac and 3 Grade View: Grade I Tube type: Oral Tube size: 7.0 mm Number of attempts: 1 Airway Equipment and Method: Stylet and Oral airway Placement Confirmation: ETT inserted through vocal cords under direct vision, positive ETCO2 and breath sounds checked- equal and bilateral Secured at: 21 cm Tube secured with: Tape Dental Injury: Teeth and Oropharynx as per pre-operative assessment

## 2021-01-18 NOTE — Interval H&P Note (Signed)
History and Physical Interval Note:  01/18/2021 12:22 PM  Laura Steele  has presented today for surgery, with the diagnosis of Right ovarian cyst.  The various methods of treatment have been discussed with the patient and family. After consideration of risks, benefits and other options for treatment, the patient has consented to  Procedure(s): EXPLORATORY LAPAROTOMY (N/A) OPEN SALPINGO OOPHORECTOMY (Right) as a surgical intervention.  The patient's history has been reviewed, patient examined, no change in status, stable for surgery.  I have reviewed the patient's chart and labs. Again pt is aware of risks of bleeding, infection, involvement of other organs. Questions were answered to the patient's satisfaction.     Griffin Basil

## 2021-01-18 NOTE — Transfer of Care (Signed)
Immediate Anesthesia Transfer of Care Note  Patient: Laura Steele  Procedure(s) Performed: EXPLORATORY LAPAROTOMY (Abdomen) OPEN RIGHT SALPINGO OOPHORECTOMY (Right: Abdomen)  Patient Location: PACU  Anesthesia Type:General  Level of Consciousness: awake, alert  and oriented  Airway & Oxygen Therapy: Patient Spontanous Breathing  Post-op Assessment: Report given to RN  Post vital signs: Reviewed and stable  Last Vitals:  Vitals Value Taken Time  BP 129/76 01/18/21 1608  Temp    Pulse 86 01/18/21 1609  Resp 23 01/18/21 1609  SpO2 98 % 01/18/21 1609  Vitals shown include unvalidated device data.  Last Pain:  Vitals:   01/18/21 1134  TempSrc:   PainSc: 8          Complications: No notable events documented.

## 2021-01-18 NOTE — Op Note (Addendum)
PROCEDURE DATE:01/18/2021  PREOPERATIVE DIAGNOSIS:  right dermoid cyst POSTOPERATIVE DIAGNOSIS:  The same SURGEON:   Lynnda Shields, M.D. ASSISTANT: Arlina Robes, M.D. ANESTHESIOLOGISTJillyn Hidden, M.D. OPERATION:  Exploratory laparotomy,  right Salpingoohorectomy ANESTHESIA:  General endotracheal.   An experienced assistant was required given the standard of surgical care given the complexity of the case.  This assistant was needed for exposure, dissection, suctioning, retraction, instrument exchange, and for overall help during the procedure.  INDICATIONS: The patient is a 45 y.o. [id number] with  previously seen large right dermoid cyst who desired definitive surgical management. On the preoperative visit, the risks, benefits, indications, and alternatives of the procedure were reviewed with the patient.  On the day of surgery, the risks of surgery were again discussed with the patient including but not limited to: bleeding which may require transfusion or reoperation; infection which may require antibiotics; injury to bowel, bladder, ureters or other surrounding organs; need for additional procedures; thromboembolic phenomenon, incisional problems and other postoperative/anesthesia complications. Written informed consent was obtained.    OPERATIVE FINDINGS: An absent uterus with normal left ovary with small cyst.   No other anomalies noted in pelvis, no lesions of endometriosis.    ESTIMATED BLOOD LOSS: 25 ml FLUIDS:  500 ml of Lactated Ringers URINE OUTPUT:  20 ml of clear yellow urine. SPECIMENS:  Right  ovary sent to pathology COMPLICATIONS:  None immediate.  DESCRIPTION OF PROCEDURE:  The patient received intravenous antibiotics and had sequential compression devices applied to her lower extremities while in the preoperative area.   She was taken to the operating room and placed under general anesthesia without difficulty.The abdomen and perineum were prepped and draped in a sterile manner,  and she was placed in a dorsal supine position.  A Foley catheter was inserted into the bladder and attached to constant drainage. After an adequate timeout was performed, a Pfannensteil skin incision was made. This incision was taken down to the fascia using electrocautery with care given to maintain good hemostasis. The fascia was incised in the midline and the fascial incision was then extended bilaterally using electrocautery without difficulty. The fascia was then dissected off the underlying rectus muscles using blunt and sharp dissection. The rectus muscles were split bluntly in the midline and the peritoneum entered sharply without complication. This peritoneal incision was then extended superiorly and inferiorly with care given to prevent bowel or bladder injury.  Attention was then turned to the pelvis. The ovary at this point was noted to be mobilized and was delivered up out of the abdomen.  The right infundibulopelvic ligament clamped on the patient's right side. This pedicle was then clamped, cut, and doubly suture ligated with 0 Vicryl.  The pelvis was irrigated and hemostasis was reconfirmed at all pedicles.  All laparotomy sponges and instruments were removed from the abdomen. The peritoneum was closed with a 2-0 Vicryl running stitch, and the fascia was also closed in a running fashion with 0 PDS suture. The subcutaneous layer was irrigated and injected with 30 ml of 0.5% Marcaine. The skin was closed with a 4-0 Vicryl subcuticular stitch. Sponge, lap, needle, and instrument counts were correct times three. The patient was taken to the recovery area awake, extubated and in stable condition.  Lynnda Shields, MD, [place] Attending [place], [facility]

## 2021-01-19 ENCOUNTER — Telehealth: Payer: Self-pay | Admitting: *Deleted

## 2021-01-19 ENCOUNTER — Inpatient Hospital Stay (HOSPITAL_COMMUNITY): Payer: Self-pay

## 2021-01-19 ENCOUNTER — Encounter (HOSPITAL_COMMUNITY): Payer: Self-pay | Admitting: Obstetrics and Gynecology

## 2021-01-19 LAB — BASIC METABOLIC PANEL
Anion gap: 10 (ref 5–15)
BUN: 5 mg/dL — ABNORMAL LOW (ref 6–20)
CO2: 23 mmol/L (ref 22–32)
Calcium: 8.9 mg/dL (ref 8.9–10.3)
Chloride: 103 mmol/L (ref 98–111)
Creatinine, Ser: 0.53 mg/dL (ref 0.44–1.00)
GFR, Estimated: >60 mL/min (ref >60)
Glucose, Bld: 125 mg/dL — ABNORMAL HIGH (ref 70–99)
Potassium: 4 mmol/L (ref 3.5–5.1)
Sodium: 136 mmol/L (ref 135–145)

## 2021-01-19 LAB — CBC
HCT: 41.3 % (ref 36.0–46.0)
Hemoglobin: 13.6 g/dL (ref 12.0–15.0)
MCH: 32.4 pg (ref 26.0–34.0)
MCHC: 32.9 g/dL (ref 30.0–36.0)
MCV: 98.3 fL (ref 80.0–100.0)
Platelets: 397 10*3/uL (ref 150–400)
RBC: 4.2 MIL/uL (ref 3.87–5.11)
RDW: 13.1 % (ref 11.5–15.5)
WBC: 24.5 10*3/uL — ABNORMAL HIGH (ref 4.0–10.5)
nRBC: 0 % (ref 0.0–0.2)

## 2021-01-19 LAB — LIPASE, BLOOD: Lipase: 20 U/L (ref 11–51)

## 2021-01-19 LAB — AMYLASE: Amylase: 28 U/L (ref 28–100)

## 2021-01-19 MED ORDER — PANTOPRAZOLE SODIUM 40 MG PO TBEC
40.0000 mg | DELAYED_RELEASE_TABLET | Freq: Every day | ORAL | Status: DC
  Administered 2021-01-19: 40 mg via ORAL
  Filled 2021-01-19: qty 1

## 2021-01-19 MED ORDER — PANTOPRAZOLE SODIUM 40 MG PO TBEC: 40.0000 mg | DELAYED_RELEASE_TABLET | Freq: Every day | ORAL | 1 refills | Status: AC

## 2021-01-19 MED ORDER — CYCLOBENZAPRINE HCL 10 MG PO TABS
10.0000 mg | ORAL_TABLET | Freq: Three times a day (TID) | ORAL | Status: DC | PRN
  Administered 2021-01-19 (×2): 10 mg via ORAL
  Filled 2021-01-19 (×3): qty 1

## 2021-01-19 MED ORDER — OXYCODONE HCL 5 MG PO TABS: 5.0000 mg | ORAL_TABLET | ORAL | 0 refills | Status: AC | PRN

## 2021-01-19 MED ORDER — IBUPROFEN 600 MG PO TABS: 600.0000 mg | ORAL_TABLET | Freq: Four times a day (QID) | ORAL | 2 refills | Status: DC | PRN

## 2021-01-19 NOTE — Progress Notes (Signed)
Gynecology Progress Note  Admission Date: 01/18/2021 Current Date: 01/19/2021 1:24 PM Pt seen at [facility] is a 45 y.o. [id number] HD#1 admitted for surgical treatment for dermoid cyst.  History complicated by: Patient Active Problem List   Diagnosis Date Noted   Preoperative exam for gynecologic surgery 01/10/2021   Dermoid cyst of right ovary 12/29/2020   Pelvic pain 12/29/2020   Tobacco use 12/23/2020   COPD (chronic obstructive pulmonary disease) ([place]) 12/06/2020   Vitamin D deficiency 10/11/2020   Ground glass opacity present on imaging of lung 10/11/2020   Pulmonary nodule less than 1 cm in diameter with moderate to high risk for malignant neoplasm 10/11/2020   Alcohol withdrawal delirium ([place]) 04/25/2020   Anxiety and depression 04/25/2020   Hypothyroidism 03/25/2019   Carpal tunnel syndrome, bilateral 01/17/2018   DDD (degenerative disc disease), cervical 01/17/2018   B12 deficiency 12/26/2017   Folate deficiency 12/26/2017   Elevated hemoglobin (HCC) 10/24/2017   Elevated glucose 10/24/2017   Muscle spasm 06/22/2017   Insomnia 06/22/2017   Abnormal mammogram of right breast 01/26/2017   Macrocytosis without anemia 12/05/2016   Hyperhidrosis of axilla 11/21/2016    ROS and patient/family/surgical history, located on admission H&P note dated 01/18/2021, have been reviewed, and there are no changes except as noted below Yesterday/Overnight Events:  uncomplicated  Subjective:   Pt is doing well this AM and is eating breakfast.  Her abdominal pain is well controlled.  She still notes some epigastric pain which was present prior to surgery. She denies nausea and vomiting.  Pt has passed flatus.  No isues with voiding.  Objective:   Vitals:   01/18/21 1857 01/18/21 2321 01/19/21 0422 01/19/21 0741  BP: 119/82  121/80 118/70  Pulse: 66 70 70 75  Resp: 16 17  20   Temp: 98.3 F (36.8 C) 98.2 F (36.8 C) 98.1 F (36.7 C) 98.1 F (36.7 C)  TempSrc: Oral Oral  Oral Oral  SpO2: 96% 99% 98% 99%  Weight:      Height:        Temp:  [97.7 F (36.5 C)-98.3 F (36.8 C)] 98.1 F (36.7 C) (10/26 0741) Pulse Rate:  [61-86] 75 (10/26 0741) Resp:  [12-22] 20 (10/26 0741) BP: (97-129)/(68-82) 118/70 (10/26 0741) SpO2:  [93 %-100 %] 99 % (10/26 0741) I/O last 3 completed shifts: In: 2622.5 [P.O.:1150; I.V.:1472.5] Out: 345 [Urine:320; Blood:25] Total I/O In: 480 [P.O.:480] Out: -   Intake/Output Summary (Last 24 hours) at 01/19/2021 1324 Last data filed at 01/19/2021 1253 Gross per 24 hour  Intake 3102.48 ml  Output 345 ml  Net 2757.48 ml     Current Vital Signs 24h Vital Sign Ranges  T 98.1 F (36.7 C) Temp  Avg: 98 F (36.7 C)  Min: 97.7 F (36.5 C)  Max: 98.3 F (36.8 C)  BP 118/70 BP  Min: 97/69  Max: 129/76  HR 75 Pulse  Avg: 70.1  Min: 61  Max: 86  RR 20 Resp  Avg: 17.6  Min: 12  Max: 22  SaO2 99 % Room Air SpO2  Avg: 97.5 %  Min: 93 %  Max: 100 %       24 Hour I/O Current Shift I/O  Time Ins Outs 10/25 0701 - 10/26 0700 In: 2622.5 [P.O.:1150; I.V.:1472.5] Out: 345 [Urine:320] 10/26 0701 - 10/26 1900 In: 480 [P.O.:480] Out: -    Patient Vitals for the past 12 hrs:  BP Temp Temp src Pulse Resp SpO2  01/19/21 0741 118/70  98.1 F (36.7 C) Oral 75 20 99 %  01/19/21 0422 121/80 98.1 F (36.7 C) Oral 70 -- 98 %     Patient Vitals for the past 24 hrs:  BP Temp Temp src Pulse Resp SpO2  01/19/21 0741 118/70 98.1 F (36.7 C) Oral 75 20 99 %  01/19/21 0422 121/80 98.1 F (36.7 C) Oral 70 -- 98 %  01/18/21 2321 -- 98.2 F (36.8 C) Oral 70 17 99 %  01/18/21 1857 119/82 98.3 F (36.8 C) Oral 66 16 96 %  01/18/21 1825 98/68 97.7 F (36.5 C) -- 71 20 93 %  01/18/21 1810 113/74 -- -- 67 17 98 %  01/18/21 1755 117/72 -- -- 69 15 96 %  01/18/21 1740 118/81 -- -- 68 15 96 %  01/18/21 1725 97/69 -- -- 67 17 99 %  01/18/21 1710 125/81 -- -- 63 20 96 %  01/18/21 1655 126/81 -- -- 65 19 100 %  01/18/21 1640 112/82 -- -- 61 19  100 %  01/18/21 1625 119/71 -- -- 84 (!) 22 97 %  01/18/21 1610 129/76 97.8 F (36.6 C) -- 86 12 98 %    Physical exam: General appearance: alert, cooperative, and appears stated age Abdomen: soft, non-tender; bowel sounds normal; no masses,  no organomegaly and moderate epigastric pain GU: No gross VB Lungs: clear to auscultation bilaterally Heart: regular rate and rhythm Extremities: no lower extremity edema Skin: WNL Psych: appropriate Neurologic: Grossly normal  Medications Current Facility-Administered Medications  Medication Dose Route Frequency Provider Last Rate Last Admin   acetaminophen (TYLENOL) tablet 650 mg  650 mg Oral Q4H PRN Griffin Basil, MD       cyclobenzaprine (FLEXERIL) tablet 10 mg  10 mg Oral TID PRN Constant, Peggy, MD   10 mg at 01/19/21 0434   docusate sodium (COLACE) capsule 100 mg  100 mg Oral BID Griffin Basil, MD   100 mg at 01/19/21 1055   enoxaparin (LOVENOX) injection 40 mg  40 mg Subcutaneous Q24H Griffin Basil, MD       lactated ringers infusion   Intravenous Continuous Griffin Basil, MD 100 mL/hr at 01/19/21 0636 New Bag at 01/19/21 0636   ondansetron (ZOFRAN) tablet 4 mg  4 mg Oral Q6H PRN Griffin Basil, MD       Or   ondansetron [facility]) injection 4 mg  4 mg Intravenous Q6H PRN Griffin Basil, MD       oxyCODONE (Oxy IR/ROXICODONE) immediate release tablet 5-10 mg  5-10 mg Oral Q4H PRN Griffin Basil, MD   5 mg at 01/19/21 1055   pantoprazole (PROTONIX) EC tablet 40 mg  40 mg Oral Daily Griffin Basil, MD   40 mg at 01/19/21 1055   senna-docusate (Senokot-S) tablet 1 tablet  1 tablet Oral QHS PRN Griffin Basil, MD       simethicone [facility]) chewable tablet 80 mg  80 mg Oral QID PRN Griffin Basil, MD          Labs  Recent Labs  Lab 01/14/21 0830 01/18/21 1945 01/19/21 0217  WBC 14.7* 24.1* 24.5*  HGB 16.9* 15.1* 13.6  HCT 50.8* 45.3 41.3  PLT 437* 419* 397    Recent Labs  Lab 01/14/21 0830  01/18/21 1945 01/19/21 0217  NA 134*  --  136  K 3.4*  --  4.0  CL 103  --  103  CO2 22  --  23  BUN 5*  --  5*  CREATININE 0.56 0.57 0.53  CALCIUM 8.6*  --  8.9  GLUCOSE 92  --  125*    Radiology RUQ u/s pending  Assessment & Plan:  POD 1 s/p exploratory laparotomy and  RSO Will check amylase and lipase as well as RUQ u/s.  I do not believe these symptoms are related to her procedure. Anticipate discharge in PM  Code Status: Full Code  Lynnda Shields, MD Attending Center for [facility])

## 2021-01-19 NOTE — Discharge Summary (Signed)
Physician Discharge Summary  Patient ID: Laura Steele MRN: [id number] DOB/AGE: Apr 04, 1975 45 y.o.  Admit date: 01/18/2021 Discharge date: 01/19/2021  Admission Diagnoses: right dermoid cyst  Discharge Diagnoses:  Active Problems:   Dermoid cyst of right ovary   Discharged Condition: good  Hospital Course: Pt was admitted to the hospital for planned exploratory laparotomy and right salpingo-oophorectomy.  Please see operative note.  Pt did well postoperatively.  She did still complain of mid-epigastric pain which had been present prior to surgery.  Amylase, lipase and RUQ [place] were ordered for evaluation.  No significant findings were noted.  Pt has been eating well after her procedure.  She is voiding without incident.  Positive flatus noted.  Pt did have elevated WBC, but no obvious source.  Preop testing did show high hemoglobin and hematocrit, will refer to hematology after her post op visit.  GI referral post op as well if epigastric pain continues.  Pt was discharged home on POD 1.  Consults: None  Significant Diagnostic Studies: radiology: Ultrasound: RUQ scan  Treatments: surgery: exploratory laparotomy RSO  Discharge Exam: Blood pressure 118/70, pulse 75, temperature 98.1 F (36.7 C), temperature source Oral, resp. rate 20, height 5\' 3"  (1.6 m), weight 65.3 kg, SpO2 99 %. General appearance: alert, cooperative, and no distress Head: Normocephalic, without obvious abnormality, atraumatic Resp: clear to auscultation bilaterally Cardio: regular rate and rhythm Extremities: Homans sign is negative, no sign of DVT and no edema, redness or tenderness in the calves or thighs Skin: Skin color, texture, turgor normal. No rashes or lesions Incision/Wound:  bandage in place, no excessive drainage, wound intact  Disposition: Discharge disposition: 01-Home or Self Care       Discharge Instructions      Remove dressing in 72 hours   Complete by: As directed    Call MD for:   difficulty breathing, headache or visual disturbances   Complete by: As directed    Call MD for:  persistant dizziness or light-headedness   Complete by: As directed    Call MD for:  persistant nausea and vomiting   Complete by: As directed    Call MD for:  redness, tenderness, or signs of infection (pain, swelling, redness, odor or green/yellow discharge around incision site)   Complete by: As directed    Call MD for:  severe uncontrolled pain   Complete by: As directed    Call MD for:  temperature >100.4   Complete by: As directed    Diet - low sodium heart healthy   Complete by: As directed    Discharge wound care:   Complete by: As directed    Patient may shower, make sure incision is dried off after shower, no soak baths   Driving Restrictions   Complete by: As directed    No driving while taking narcotics, no driving for 3-5 days   Increase activity slowly   Complete by: As directed    Sexual Activity Restrictions   Complete by: As directed    Pelvic rest until post op visit      Allergies as of 01/19/2021   No Known Allergies      Medication List     STOP taking these medications    Diclofenac Sodium CR 100 MG 24 hr tablet   DULoxetine 60 MG capsule Commonly known as: CYMBALTA   HYDROcodone-acetaminophen 5-325 MG tablet Commonly known as: NORCO/VICODIN   hydrOXYzine 25 MG tablet Commonly known as: ATARAX/VISTARIL   naproxen 500 MG tablet Commonly  known as: NAPROSYN   ondansetron 4 MG disintegrating tablet Commonly known as: Zofran ODT   Vitamin D3 50 MCG (2000 UT) capsule       TAKE these medications    albuterol 108 (90 Base) MCG/ACT inhaler Commonly known as: VENTOLIN HFA Inhale 1-2 puffs into the lungs daily as needed for wheezing or shortness of breath.   glycopyrrolate 1 MG tablet Commonly known as: Robinul Take 1 tablet (1 mg total) by mouth daily.   ibuprofen 600 MG tablet Commonly known as: ADVIL Take 1 tablet (600 mg total) by  mouth every 6 (six) hours as needed for headache, mild pain, moderate pain or cramping.   levothyroxine 50 MCG tablet Commonly known as: SYNTHROID Take 1 tablet (50 mcg total) by mouth daily before breakfast.   oxyCODONE 5 MG immediate release tablet Commonly known as: Oxy IR/ROXICODONE Take 1-2 tablets (5-10 mg total) by mouth every 4 (four) hours as needed for up to 5 days for moderate pain.   pantoprazole 40 MG tablet Commonly known as: PROTONIX Take 1 tablet (40 mg total) by mouth daily. Start taking on: January 20, 2021 What changed:  medication strength how much to take   pregabalin 75 MG capsule Commonly known as: Lyrica Take 1 capsule (75 mg total) by mouth 2 (two) times daily.   tizanidine 2 MG capsule Commonly known as: ZANAFLEX Take 1 capsule (2 mg total) by mouth 3 (three) times daily as needed for muscle spasms.               Discharge Care Instructions  (From admission, onward)           Start     Ordered   01/19/21 0000  Discharge wound care:       Comments: Patient may shower, make sure incision is dried off after shower, no soak baths   01/19/21 1741            Follow-up Information     Griffin Basil, MD. Schedule an appointment as soon as possible for a visit in 3 week(s).   Specialty: Obstetrics and Gynecology Why: postop visit Contact information: [address] [place] [address] [phone]                 Signed: Griffin Basil 01/19/2021, 6:14 PM

## 2021-01-19 NOTE — Progress Notes (Signed)
Mobility Specialist Progress Note:   01/19/21 1433  Mobility  Activity Ambulated in hall  Level of Assistance Independent  Assistive Device None  Distance Ambulated (ft) 260 ft  Mobility Ambulated independently in hallway  Mobility Response Tolerated well  Mobility performed by Mobility specialist  $Mobility charge 1 Mobility   Pt c/o 8/10 pain during ambulation. Stated the pain was incision pain and it felt like the stitches were coming apart.   Nelta Numbers Mobility Specialist  Phone [phone]

## 2021-01-19 NOTE — Telephone Encounter (Signed)
Patient left a MyChart message to call her. I called her and she said the issue was taken care of. She also asked about her FMLA papers- I informed her I will send a message to the person who handles those to reach out to her. Dmitriy Gair,RN

## 2021-01-20 LAB — SURGICAL PATHOLOGY

## 2021-01-21 NOTE — Anesthesia Postprocedure Evaluation (Signed)
Anesthesia Post Note  Patient: Engineer, maintenance (IT)  Procedure(s) Performed: EXPLORATORY LAPAROTOMY (Abdomen) OPEN RIGHT SALPINGO OOPHORECTOMY (Right: Abdomen)     Patient location during evaluation: PACU Anesthesia Type: General Level of consciousness: awake and sedated Pain management: pain level controlled Vital Signs Assessment: post-procedure vital signs reviewed and stable Respiratory status: spontaneous breathing Cardiovascular status: stable Postop Assessment: no apparent nausea or vomiting Anesthetic complications: no   No notable events documented.  Last Vitals:  Vitals:   01/19/21 0422 01/19/21 0741  BP: 121/80 118/70  Pulse: 70 75  Resp:  20  Temp: 36.7 C 36.7 C  SpO2: 98% 99%    Last Pain:  Vitals:   01/19/21 1140  TempSrc:   PainSc: [place] Jr

## 2021-01-26 ENCOUNTER — Other Ambulatory Visit: Payer: Self-pay | Admitting: Emergency Medicine

## 2021-01-26 DIAGNOSIS — R911 Solitary pulmonary nodule: Secondary | ICD-10-CM

## 2021-01-27 ENCOUNTER — Ambulatory Visit (INDEPENDENT_AMBULATORY_CARE_PROVIDER_SITE_OTHER): Payer: Self-pay | Admitting: Emergency Medicine

## 2021-01-27 ENCOUNTER — Encounter: Payer: Self-pay | Admitting: Emergency Medicine

## 2021-01-27 ENCOUNTER — Other Ambulatory Visit: Payer: Self-pay

## 2021-01-27 DIAGNOSIS — Z9189 Other specified personal risk factors, not elsewhere classified: Secondary | ICD-10-CM

## 2021-01-27 DIAGNOSIS — R911 Solitary pulmonary nodule: Secondary | ICD-10-CM

## 2021-01-27 DIAGNOSIS — J432 Centrilobular emphysema: Secondary | ICD-10-CM

## 2021-01-27 LAB — PULMONARY FUNCTION TEST
DL/VA % pred: 73 %
DL/VA: 3.25 ml/min/mmHg/L
DLCO cor % pred: 74 %
DLCO cor: 15.16 ml/min/mmHg
DLCO unc % pred: 74 %
DLCO unc: 15.16 ml/min/mmHg
FEF 25-75 Post: 1.88 L/sec
FEF 25-75 Pre: 1.46 L/sec
FEF2575-%Change-Post: 28 %
FEF2575-%Pred-Post: 64 %
FEF2575-%Pred-Pre: 50 %
FEV1-%Change-Post: 5 %
FEV1-%Pred-Post: 87 %
FEV1-%Pred-Pre: 83 %
FEV1-Post: 2.45 L
FEV1-Pre: 2.32 L
FEV1FVC-%Change-Post: 5 %
FEV1FVC-%Pred-Pre: 87 %
FEV6-%Change-Post: 1 %
FEV6-%Pred-Post: 96 %
FEV6-%Pred-Pre: 95 %
FEV6-Post: 3.27 L
FEV6-Pre: 3.24 L
FEV6FVC-%Change-Post: 1 %
FEV6FVC-%Pred-Post: 102 %
FEV6FVC-%Pred-Pre: 100 %
FVC-%Change-Post: 0 %
FVC-%Pred-Post: 94 %
FVC-%Pred-Pre: 94 %
FVC-Post: 3.27 L
FVC-Pre: 3.27 L
Post FEV1/FVC ratio: 75 %
Post FEV6/FVC ratio: 100 %
Pre FEV1/FVC ratio: 71 %
Pre FEV6/FVC Ratio: 99 %

## 2021-01-27 MED ORDER — BUDESONIDE-FORMOTEROL FUMARATE 160-4.5 MCG/ACT IN AERO: 2.0000 | INHALATION_SPRAY | Freq: Two times a day (BID) | RESPIRATORY_TRACT | 6 refills | Status: AC

## 2021-01-27 NOTE — Assessment & Plan Note (Signed)
Plan to repeat her CT scan of the chest 1 more time in February 2023.  If stable then she should not need any repeat scans.  She would be a good candidate to get into the lung cancer screening program when she reaches age 45.

## 2021-01-27 NOTE — Progress Notes (Signed)
PFT done today. 

## 2021-01-27 NOTE — Patient Instructions (Signed)
We reviewed your pulmonary function testing today. We will start Symbicort 2 puffs twice a day.  Rinse and gargle after using. Keep your albuterol available to use 2 puffs when you needed for shortness of breath, chest tightness, wheezing. Continue to work hard on decreasing your cigarettes.  Hopefully when we follow-up we will be able to consider setting a quit date. Get your flu shot this fall. We will plan to follow your CT scan of the chest without contrast in February 2023. Follow Dr. Lamonte Sakai in February to review your scan or sooner if you have any problems.

## 2021-01-27 NOTE — Addendum Note (Signed)
Addended by: Fran Lowes on: 01/27/2021 03:30 PM   Modules accepted: Orders

## 2021-01-27 NOTE — Progress Notes (Signed)
Subjective:    Patient ID: Laura Steele, female    DOB: Jul 27, 1975, 45 y.o.   MRN: [id number]  HPI 45 year old woman with history tobacco use (30 pack years, down to 0.5 pk/day), migraines, hypothyroidism, anxiety, alcohol use.  She carries a history of asthma that was made a couple yrs ago, ? In association w panic attack. Has had some benefit from albuterol.  She was seen in the ED 12/21/2020 with right flank and right lower quadrant pain for 2 weeks.  She was found to have a right ovarian dermoid cyst. She had a CT chest 11/25/2020 that I have reviewed, showed a 3 mm right lower lobe pulmonary nodule that was unchanged compared with prior done 05/02/2019  ROV 01/27/21 --follow-up visit 45 year old woman with history tobacco use, migraines, hypothyroidism, suspected asthma.  Also with a history of small pulmonary nodule disease and has been followed with serial CT scans.  She underwent pulmonary function testing today. Alpha-1 antitrypsin genotype MM, level 181 (normal) She has some exertional SOB, can happen when walking. She has daily cough, can have paroxysms. Sometimes yellow mucous.   Function testing performed today, reviewed by me show mild obstruction without a bronchodilator response, possible superimposed restriction based on the decreased RV, decreased diffusion capacity.   Review of Systems As per HPI  Past Medical History:  Diagnosis Date   Allergy    Anxiety    Asthma    COPD (chronic obstructive pulmonary disease) ([place])    Headache    Migraines   Hypoglycemia    Hypothyroidism    Pneumonia      Family History  Problem Relation Age of Onset   Diabetes Mother    Breast cancer Mother        had masectomy.    Skin cancer Maternal Grandfather     Lung cancer in her uncles   Social History   Socioeconomic History   Marital status: Married    Spouse name: Not on file   Number of children: Not on file   Years of education: Not on file   Highest education  level: Not on file  Occupational History   Not on file  Tobacco Use   Smoking status: Every Day    Packs/day: 0.50    Types: Cigarettes   Smokeless tobacco: Never   Tobacco comments:    0.5 packs smoked per day [facility] 12/23/20  Vaping Use   Vaping Use: Never used  Substance and Sexual Activity   Alcohol use: Not Currently    Comment: Pt states was drinkning 40 0z of beer a night. but stopped since sunday.   Drug use: No   Sexual activity: Yes  Other Topics Concern   Not on file  Social History Narrative   Not on file   Social Determinants of Health   Financial Resource Strain: Not on file  Food Insecurity: Not on file  Transportation Needs: Not on file  Physical Activity: Not on file  Stress: Not on file  Social Connections: Not on file  Intimate Partner Violence: Not on file    [place] dust exposure through work [place] and [place].   No Known Allergies   Outpatient Medications Prior to Visit  Medication Sig Dispense Refill   albuterol (VENTOLIN HFA) 108 (90 Base) MCG/ACT inhaler Inhale 1-2 puffs into the lungs daily as needed for wheezing or shortness of breath. 8 g 5   glycopyrrolate (ROBINUL) 1 MG tablet Take 1 tablet (1 mg total) by mouth daily. [place]  tablet 1   ibuprofen (ADVIL) 600 MG tablet Take 1 tablet (600 mg total) by mouth every 6 (six) hours as needed for headache, mild pain, moderate pain or cramping. 30 tablet 2   levothyroxine (SYNTHROID) 50 MCG tablet Take 1 tablet (50 mcg total) by mouth daily before breakfast. 90 tablet 1   pantoprazole (PROTONIX) 40 MG tablet Take 1 tablet (40 mg total) by mouth daily. 20 tablet 1   pregabalin (LYRICA) 75 MG capsule Take 1 capsule (75 mg total) by mouth 2 (two) times daily. 60 capsule 5   tizanidine (ZANAFLEX) 2 MG capsule Take 1 capsule (2 mg total) by mouth 3 (three) times daily as needed for muscle spasms. 90 capsule 5   No facility-administered medications prior to visit.         Objective:   Physical Exam Vitals:    01/27/21 1426  BP: 122/84  Pulse: (!) 120  Temp: 98.5 F (36.9 C)  TempSrc: Oral  SpO2: 99%  Weight: 138 lb (62.6 kg)  Height: 5\' 8"  (1.727 m)   Gen: Pleasant, well-nourished, in no distress,  normal affect  ENT: No lesions,  mouth clear,  oropharynx clear, no postnasal drip  Neck: No JVD, no stridor  Lungs: No use of accessory muscles, no crackles or wheezing on normal respiration, no wheeze on forced expiration  Cardiovascular: RRR, heart sounds normal, no murmur or gallops, no peripheral edema  Musculoskeletal: No deformities, no cyanosis or clubbing  Neuro: alert, awake, non focal  Skin: Warm, no lesions or rash      Assessment & Plan:  Pulmonary nodule less than 1 cm in diameter with moderate to high risk for malignant neoplasm Plan to repeat her CT scan of the chest 1 more time in February 2023.  If stable then she should not need any repeat scans.  She would be a good candidate to get into the lung cancer screening program when she reaches age 37.  COPD (chronic obstructive pulmonary disease) (HCC) Obstructive lung disease confirmed on her pulmonary function testing today.  Mild obstruction without a bronchodilator response.  She had asthma in the past.  I will start her on Symbicort to see if she gets benefit.  Continue albuterol as needed.  We will review her status on the medication in February.  Consider adding LAMA at some point going forward depending on symptoms.  She needs a flu shot.  Discussed this with her today   Baltazar Apo, MD, PhD 01/27/2021, 2:57 PM [facility] [phone] or if no answer before 7:00PM call [phone] For any issues after 7:00PM please call eLink [phone]

## 2021-01-27 NOTE — Assessment & Plan Note (Signed)
Obstructive lung disease confirmed on her pulmonary function testing today.  Mild obstruction without a bronchodilator response.  She had asthma in the past.  I will start her on Symbicort to see if she gets benefit.  Continue albuterol as needed.  We will review her status on the medication in February.  Consider adding LAMA at some point going forward depending on symptoms.  She needs a flu shot.  Discussed this with her today

## 2021-02-06 ENCOUNTER — Encounter: Payer: Self-pay | Admitting: Family Medicine

## 2021-02-06 DIAGNOSIS — L74510 Primary focal hyperhidrosis, axilla: Secondary | ICD-10-CM

## 2021-02-07 MED ORDER — LEVOTHYROXINE SODIUM 50 MCG PO TABS: 50.0000 ug | ORAL_TABLET | Freq: Every day | ORAL | 0 refills | Status: DC

## 2021-02-07 MED ORDER — TIZANIDINE HCL 2 MG PO CAPS: 2.0000 mg | ORAL_CAPSULE | Freq: Three times a day (TID) | ORAL | 0 refills | Status: DC | PRN

## 2021-02-07 MED ORDER — GLYCOPYRROLATE 1 MG PO TABS: 1.0000 mg | ORAL_TABLET | Freq: Every day | ORAL | 0 refills | Status: DC

## 2021-02-08 ENCOUNTER — Other Ambulatory Visit: Payer: Self-pay

## 2021-02-08 ENCOUNTER — Encounter: Payer: Self-pay | Admitting: Family Medicine

## 2021-02-08 ENCOUNTER — Ambulatory Visit (INDEPENDENT_AMBULATORY_CARE_PROVIDER_SITE_OTHER): Payer: Self-pay | Admitting: Family Medicine

## 2021-02-08 VITALS — BP 101/68 | HR 98 | Temp 98.5°F | Wt 143.0 lb

## 2021-02-08 DIAGNOSIS — L74510 Primary focal hyperhidrosis, axilla: Secondary | ICD-10-CM

## 2021-02-08 DIAGNOSIS — F32A Depression, unspecified: Secondary | ICD-10-CM

## 2021-02-08 DIAGNOSIS — E559 Vitamin D deficiency, unspecified: Secondary | ICD-10-CM

## 2021-02-08 DIAGNOSIS — M62838 Other muscle spasm: Secondary | ICD-10-CM

## 2021-02-08 DIAGNOSIS — Z23 Encounter for immunization: Secondary | ICD-10-CM

## 2021-02-08 DIAGNOSIS — E538 Deficiency of other specified B group vitamins: Secondary | ICD-10-CM

## 2021-02-08 DIAGNOSIS — F5101 Primary insomnia: Secondary | ICD-10-CM

## 2021-02-08 DIAGNOSIS — E034 Atrophy of thyroid (acquired): Secondary | ICD-10-CM

## 2021-02-08 DIAGNOSIS — F419 Anxiety disorder, unspecified: Secondary | ICD-10-CM

## 2021-02-08 MED ORDER — LEVOTHYROXINE SODIUM 50 MCG PO TABS: 50.0000 ug | ORAL_TABLET | Freq: Every day | ORAL | 5 refills | Status: DC

## 2021-02-08 MED ORDER — ALBUTEROL SULFATE HFA 108 (90 BASE) MCG/ACT IN AERS: 1.0000 | INHALATION_SPRAY | Freq: Every day | RESPIRATORY_TRACT | 5 refills | Status: DC | PRN

## 2021-02-08 MED ORDER — TIZANIDINE HCL 2 MG PO CAPS: 2.0000 mg | ORAL_CAPSULE | Freq: Three times a day (TID) | ORAL | 5 refills | Status: DC | PRN

## 2021-02-08 MED ORDER — GLYCOPYRROLATE 1 MG PO TABS: 1.0000 mg | ORAL_TABLET | Freq: Every day | ORAL | 5 refills | Status: DC

## 2021-02-08 MED ORDER — PREGABALIN 75 MG PO CAPS: 75.0000 mg | ORAL_CAPSULE | Freq: Two times a day (BID) | ORAL | 5 refills | Status: DC

## 2021-02-08 NOTE — Progress Notes (Signed)
Laura Steele , [birth year], 45 y.o., female MRN: [id number] Patient Care Team    Relationship Specialty Notifications Start End  Ma Hillock, DO PCP - General Family Medicine  10/22/15   Ashok Pall, MD Consulting Physician Neurosurgery  06/22/17   Silverio Decamp, MD Consulting Physician Sports Medicine  06/22/17     Chief Complaint  Patient presents with   Anxiety    [place]; pt is not fasting     Subjective: Laura Steele is a 45 y.o. 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   Hypothyroidism:  Patient reports she has been not able to afford her medication last month, since she does not currently have insurance.    Anxiety/Insomnia/chronic back pain:  She feels the lyrica is very helpful, she would like to restart - d/t lapse in insurance she has been without.    Depression screen [facility] 2/9 02/08/2021 10/11/2020 04/06/2020 10/24/2017 06/22/2017  Decreased Interest 1 1 0 0 1  Down, Depressed, Hopeless 1 0 2 0 0  PHQ - 2 Score 2 1 2  0 1  Altered sleeping 2 2 3  - 2  Tired, decreased energy 1 2 3  - 3  Change in appetite 0 2 3 - 2  Feeling bad or failure about yourself  1 0 0 - 0  Trouble concentrating 0 1 0 - 0  Moving slowly or fidgety/restless 0 1 0 - 0  Suicidal thoughts 0 0 0 - 0  PHQ-9 Score 6 9 11  - 8  Difficult doing work/chores - - - - Somewhat difficult   GAD 7 : Generalized Anxiety Score 02/08/2021 10/11/2020 04/06/2020 09/26/2019  Nervous, Anxious, on Edge 1 2 0 1  Control/stop worrying 2 1 0 1  Worry too much - different things 2 2 0 0  Trouble relaxing 2 2 3  0  Restless 1 2 0 0  Easily annoyed or irritable 3 3 0 0  Afraid - awful might happen 1 0 0 0  Total GAD 7 Score 12 12 3 2   Anxiety Difficulty - - - Not difficult at all    No Known Allergies Social History   Tobacco Use   Smoking status: Every Day    Packs/day: 0.50    Types: Cigarettes   Smokeless tobacco:  Never   Tobacco comments:    0.5 packs smoked per day [facility] 12/23/20  Substance Use Topics   Alcohol use: Not Currently    Comment: Pt states was drinkning 40 0z of beer a night. but stopped since sunday.   Past Medical History:  Diagnosis Date   Allergy    Anxiety    Asthma    COPD (chronic obstructive pulmonary disease) ([place])    Dermoid cyst of right ovary 12/29/2020   Headache    Migraines   Hypoglycemia    Hypothyroidism    Pneumonia    Past Surgical History:  Procedure Laterality Date   ABDOMINAL HYSTERECTOMY     ANTERIOR CERVICAL DECOMP/DISCECTOMY FUSION N/A 05/19/2020   Procedure: ANTERIOR CERVICAL DECOMPRESSION FUSION CERVICAL 5 - CERVICAL 6 WITH INSTRUMENTATION AND ALLOGRAFT;  Surgeon: Phylliss Bob, MD;  Location: [place];  Service: Orthopedics;  Laterality: N/A;   ANTERIOR CERVICAL DISCECTOMY  03/2017   C6-7; Dr. Christella Noa   LAPAROTOMY N/A 01/18/2021   Procedure: EXPLORATORY LAPAROTOMY;  Surgeon: Griffin Basil, MD;  Location: [place];  Service: Gynecology;  Laterality: N/A;   SALPINGOOPHORECTOMY Right  01/18/2021   Procedure: OPEN RIGHT SALPINGO OOPHORECTOMY;  Surgeon: Griffin Basil, MD;  Location: [place];  Service: Gynecology;  Laterality: Right;   Family History  Problem Relation Age of Onset   Diabetes Mother    Breast cancer Mother        had masectomy.    Skin cancer Maternal Grandfather    Allergies as of 02/08/2021   No Known Allergies      Medication List        Accurate as of February 08, 2021 11:13 AM. If you have any questions, ask your nurse or doctor.          albuterol 108 (90 Base) MCG/ACT inhaler Commonly known as: VENTOLIN HFA Inhale 1-2 puffs into the lungs daily as needed for wheezing or shortness of breath.   budesonide-formoterol 160-4.5 MCG/ACT inhaler Commonly known as: Symbicort Inhale 2 puffs into the lungs 2 (two) times daily.   glycopyrrolate 1 MG tablet Commonly known as: Robinul Take 1 tablet (1 mg total) by mouth  daily.   ibuprofen 600 MG tablet Commonly known as: ADVIL Take 1 tablet (600 mg total) by mouth every 6 (six) hours as needed for headache, mild pain, moderate pain or cramping.   levothyroxine 50 MCG tablet Commonly known as: SYNTHROID Take 1 tablet (50 mcg total) by mouth daily before breakfast.   pantoprazole 40 MG tablet Commonly known as: PROTONIX Take 1 tablet (40 mg total) by mouth daily.   pregabalin 75 MG capsule Commonly known as: Lyrica Take 1 capsule (75 mg total) by mouth 2 (two) times daily.   tizanidine 2 MG capsule Commonly known as: ZANAFLEX Take 1 capsule (2 mg total) by mouth 3 (three) times daily as needed for muscle spasms. What changed: additional instructions Changed by: Howard Pouch, DO        All past medical history, surgical history, allergies, family history, immunizations andmedications were updated in the EMR today and reviewed under the history and medication portions of their EMR.     ROS: Negative, with the exception of above mentioned in HPI   Objective:  BP 101/68   Pulse 98   Temp 98.5 F (36.9 C) (Oral)   Wt 143 lb (64.9 kg)   SpO2 98%   BMI 21.74 kg/m  Body mass index is 21.74 kg/m. Gen: Afebrile. No acute distress.  HENT: AT. [place].  Eyes:Pupils Equal Round Reactive to light, Extraocular movements intact,  Conjunctiva without redness, discharge or icterus. CV: RRR no murmur Chest: CTAB, no wheeze or crackles Skin: no rashes, purpura or petechiae.  Neuro: Normal gait. PERLA. EOMi. Alert. Oriented x3 Psych: Normal affect, dress and demeanor. Normal speech. Normal thought content and judgment.   No results found. No results found. No results found for this or any previous visit (from the past 24 hour(s)).  Assessment/Plan: Laura Steele is a 45 y.o. female present for OV for  Hypothyroidism -continue  levothyroxine 50 mcg daily.  Currently pt does not have insurance and has been w/o meds.   - TSH recommended next visit.    Vit d def/b12 de/folate - encouraged her to take a B-complex vitamin.  Continue vit d 5000k otc qd with food Vit d, b12 and folate collected today.    Anxiety/insomnia/chronic neck pain:  Restart  Lyrica 75 mg BID.  Could consider BB  F/u 6 mos.    Hyperhidrosis of axilla Stable.  Continue Robinul  Abnormal findings on diagnostic imaging of lung/Pulmonary nodule less than 1 cm  in diameter with moderate to high risk for malignant neoplasm/Ground glass opacity present on imaging of lung Daily smoker. Not ready to quite. CT chest lung nodule f/u 11/25/2020  Influenza vaccine administered today.    Reviewed expectations re: course of current medical issues. Discussed self-management of symptoms. Outlined signs and symptoms indicating need for more acute intervention. Patient verbalized understanding and all questions were answered. Patient received an After-Visit Summary.    Orders Placed This Encounter  Procedures   Flu Vaccine QUAD 6+ mos PF IM (Fluarix Quad PF)    Meds ordered this encounter  Medications   albuterol (VENTOLIN HFA) 108 (90 Base) MCG/ACT inhaler    Sig: Inhale 1-2 puffs into the lungs daily as needed for wheezing or shortness of breath.    Dispense:  8 g    Refill:  5   glycopyrrolate (ROBINUL) 1 MG tablet    Sig: Take 1 tablet (1 mg total) by mouth daily.    Dispense:  30 tablet    Refill:  5   levothyroxine (SYNTHROID) 50 MCG tablet    Sig: Take 1 tablet (50 mcg total) by mouth daily before breakfast.    Dispense:  30 tablet    Refill:  5   tizanidine (ZANAFLEX) 2 MG capsule    Sig: Take 1 capsule (2 mg total) by mouth 3 (three) times daily as needed for muscle spasms.    Dispense:  30 capsule    Refill:  5   pregabalin (LYRICA) 75 MG capsule    Sig: Take 1 capsule (75 mg total) by mouth 2 (two) times daily.    Dispense:  60 capsule    Refill:  5    Note is dictated utilizing voice recognition software. Although note has been proof read  prior to signing, occasional typographical errors still can be missed. If any questions arise, please do not hesitate to call for verification.   electronically signed by:  Howard Pouch, DO  [place]

## 2021-02-08 NOTE — Patient Instructions (Signed)
Great to see you today.  I have refilled the medication(s) we provide.   If labs were collected, we will inform you of lab results once received either by echart message or telephone call.   - echart message- for normal results that have been seen by the patient already.   - telephone call: abnormal results or if patient has not viewed results in their echart.  

## 2021-02-11 ENCOUNTER — Ambulatory Visit: Payer: 59 | Admitting: Obstetrics and Gynecology

## 2021-02-28 ENCOUNTER — Ambulatory Visit: Payer: Self-pay | Admitting: Family Medicine

## 2021-03-03 ENCOUNTER — Ambulatory Visit: Payer: Self-pay | Admitting: Obstetrics and Gynecology

## 2021-05-04 ENCOUNTER — Inpatient Hospital Stay: Admission: RE | Admit: 2021-05-04 | Payer: Medicaid Other | Source: Ambulatory Visit

## 2021-07-11 ENCOUNTER — Telehealth: Payer: Self-pay | Admitting: Family Medicine

## 2021-07-11 ENCOUNTER — Encounter: Payer: Self-pay | Admitting: Family Medicine

## 2021-07-11 ENCOUNTER — Ambulatory Visit (INDEPENDENT_AMBULATORY_CARE_PROVIDER_SITE_OTHER): Payer: Medicaid Other | Admitting: Family Medicine

## 2021-07-11 VITALS — BP 129/87 | HR 95 | Temp 98.2°F | Ht 63.0 in | Wt 149.0 lb

## 2021-07-11 DIAGNOSIS — R7309 Other abnormal glucose: Secondary | ICD-10-CM

## 2021-07-11 DIAGNOSIS — L74510 Primary focal hyperhidrosis, axilla: Secondary | ICD-10-CM

## 2021-07-11 DIAGNOSIS — D582 Other hemoglobinopathies: Secondary | ICD-10-CM

## 2021-07-11 DIAGNOSIS — E538 Deficiency of other specified B group vitamins: Secondary | ICD-10-CM

## 2021-07-11 DIAGNOSIS — E034 Atrophy of thyroid (acquired): Secondary | ICD-10-CM

## 2021-07-11 DIAGNOSIS — F32A Depression, unspecified: Secondary | ICD-10-CM

## 2021-07-11 DIAGNOSIS — F5101 Primary insomnia: Secondary | ICD-10-CM

## 2021-07-11 DIAGNOSIS — M62838 Other muscle spasm: Secondary | ICD-10-CM

## 2021-07-11 DIAGNOSIS — M5030 Other cervical disc degeneration, unspecified cervical region: Secondary | ICD-10-CM

## 2021-07-11 DIAGNOSIS — D7589 Other specified diseases of blood and blood-forming organs: Secondary | ICD-10-CM

## 2021-07-11 DIAGNOSIS — J432 Centrilobular emphysema: Secondary | ICD-10-CM

## 2021-07-11 DIAGNOSIS — E559 Vitamin D deficiency, unspecified: Secondary | ICD-10-CM

## 2021-07-11 DIAGNOSIS — F419 Anxiety disorder, unspecified: Secondary | ICD-10-CM

## 2021-07-11 LAB — CBC WITH DIFFERENTIAL/PLATELET
Basophils Absolute: 0.1 10*3/uL (ref 0.0–0.1)
Basophils Relative: 0.7 % (ref 0.0–3.0)
Eosinophils Absolute: 0.2 10*3/uL (ref 0.0–0.7)
Eosinophils Relative: 1.3 % (ref 0.0–5.0)
HCT: 45.1 % (ref 36.0–46.0)
Hemoglobin: 14.8 g/dL (ref 12.0–15.0)
Lymphocytes Relative: 15 % (ref 12.0–46.0)
Lymphs Abs: 2 10*3/uL (ref 0.7–4.0)
MCHC: 32.7 g/dL (ref 30.0–36.0)
MCV: 98.9 fl (ref 78.0–100.0)
Monocytes Absolute: 1.3 10*3/uL — ABNORMAL HIGH (ref 0.1–1.0)
Monocytes Relative: 10.3 % (ref 3.0–12.0)
Neutro Abs: 9.5 10*3/uL — ABNORMAL HIGH (ref 1.4–7.7)
Neutrophils Relative %: 72.7 % (ref 43.0–77.0)
Platelets: 307 10*3/uL (ref 150.0–400.0)
RBC: 4.56 Mil/uL (ref 3.87–5.11)
RDW: 13.1 % (ref 11.5–15.5)
WBC: 13.1 10*3/uL — ABNORMAL HIGH (ref 4.0–10.5)

## 2021-07-11 LAB — BASIC METABOLIC PANEL
BUN: 7 mg/dL (ref 6–23)
CO2: 27 mEq/L (ref 19–32)
Calcium: 9.1 mg/dL (ref 8.4–10.5)
Chloride: 99 mEq/L (ref 96–112)
Creatinine, Ser: 0.61 mg/dL (ref 0.40–1.20)
GFR: 108.37 mL/min (ref >60.00)
Glucose, Bld: 85 mg/dL (ref 70–99)
Potassium: 4.4 mEq/L (ref 3.5–5.1)
Sodium: 135 mEq/L (ref 135–145)

## 2021-07-11 LAB — TSH: TSH: 4.67 u[IU]/mL (ref 0.35–5.50)

## 2021-07-11 LAB — HEMOGLOBIN A1C: Hgb A1c MFr Bld: 5.3 % (ref 4.6–6.5)

## 2021-07-11 LAB — VITAMIN D 25 HYDROXY (VIT D DEFICIENCY, FRACTURES): VITD: 16.58 ng/mL — ABNORMAL LOW (ref 30.00–100.00)

## 2021-07-11 MED ORDER — ALBUTEROL SULFATE HFA 108 (90 BASE) MCG/ACT IN AERS: 1.0000 | INHALATION_SPRAY | Freq: Every day | RESPIRATORY_TRACT | 5 refills | Status: AC | PRN

## 2021-07-11 MED ORDER — GLYCOPYRROLATE 1 MG PO TABS: 1.0000 mg | ORAL_TABLET | Freq: Every day | ORAL | 5 refills | Status: AC

## 2021-07-11 MED ORDER — PREGABALIN 75 MG PO CAPS: 75.0000 mg | ORAL_CAPSULE | Freq: Two times a day (BID) | ORAL | 5 refills | Status: AC

## 2021-07-11 MED ORDER — TIZANIDINE HCL 2 MG PO CAPS: 2.0000 mg | ORAL_CAPSULE | Freq: Three times a day (TID) | ORAL | 5 refills | Status: AC | PRN

## 2021-07-11 MED ORDER — LEVOTHYROXINE SODIUM 50 MCG PO TABS: 50.0000 ug | ORAL_TABLET | Freq: Every day | ORAL | 11 refills | Status: AC

## 2021-07-11 MED ORDER — VITAMIN D (ERGOCALCIFEROL) 1.25 MG (50000 UNIT) PO CAPS: 50000.0000 [IU] | ORAL_CAPSULE | ORAL | 3 refills | Status: AC

## 2021-07-11 NOTE — Progress Notes (Signed)
? ? ? ?Laura Steele , 1975-06-25, 46 y.o., female ?MRN: [id number] ?Patient Care Team  ?  Relationship Specialty Notifications Start End  ?Ma Hillock, DO PCP - General Family Medicine  10/22/15   ?Ashok Pall, MD Consulting Physician Neurosurgery  06/22/17   ?Silverio Decamp, MD Consulting Physician Sports Medicine  06/22/17   ? ? ?Chief Complaint  ?Patient presents with  ? Anxiety  ?  [facility]; pt is fasting  ? ?  ?Subjective: Laura Steele is a 46 y.o. female present for [facility] ?Hyperhidrosis of axilla ?She reports her condition is well controlled with use of Robinul daily. ? ?Hypothyroidism:  ?Patient reports compliance  with levothyroxine 50 mcg daily. She denies side effects.  ?  ?Anxiety/Insomnia/chronic back pain:  ?Reports compliance with lyrica BID.  ? ? ?  07/11/2021  ?  9:33 AM 02/08/2021  ? 10:27 AM 10/11/2020  ?  2:28 PM 04/06/2020  ?  2:09 PM 10/24/2017  ?  8:34 AM  ?Depression screen PHQ 2/9  ?Decreased Interest 0 1 1 0 0  ?Down, Depressed, Hopeless 0 1 0 2 0  ?PHQ - 2 Score 0 '2 1 2 '$ 0  ?Altered sleeping '3 2 2 3   '$ ?Tired, decreased energy '2 1 2 3   '$ ?Change in appetite 2 0 2 3   ?Feeling bad or failure about yourself  0 1 0 0   ?Trouble concentrating 0 0 1 0   ?Moving slowly or fidgety/restless 0 0 1 0   ?Suicidal thoughts 0 0 0 0   ?PHQ-9 Score '7 6 9 11   '$ ? ? ?  07/11/2021  ?  9:33 AM 02/08/2021  ? 10:27 AM 10/11/2020  ?  2:30 PM 04/06/2020  ?  2:11 PM  ?GAD 7 : Generalized Anxiety Score  ?Nervous, Anxious, on Edge '2 1 2 '$ 0  ?Control/stop worrying '2 2 1 '$ 0  ?Worry too much - different things '2 2 2 '$ 0  ?Trouble relaxing '2 2 2 3  '$ ?Restless '1 1 2 '$ 0  ?Easily annoyed or irritable '3 3 3 '$ 0  ?Afraid - awful might happen 1 1 0 0  ?Total GAD 7 Score '13 12 12 3  '$ ? ? ?No Known Allergies ?Social History  ? ?Tobacco Use  ? Smoking status: Former  ?  Packs/day: 0.50  ?  Types: Cigarettes  ?  Quit date: 02/2021  ?  Years since quitting: 0.3  ? Smokeless tobacco: Never  ? Tobacco comments:  ?  0.5 packs smoked per day [facility]  12/23/20  ?Substance Use Topics  ? Alcohol use: Not Currently  ?  Comment: Pt states was drinkning 40 0z of beer a night. but stopped since sunday.  ? ?Past Medical History:  ?Diagnosis Date  ? Allergy   ? Anxiety   ? Asthma   ? COPD (chronic obstructive pulmonary disease) ([place])   ? Dermoid cyst of right ovary 12/29/2020  ? Headache   ? Migraines  ? Hypoglycemia   ? Hypothyroidism   ? Pneumonia   ? ?Past Surgical History:  ?Procedure Laterality Date  ? ABDOMINAL HYSTERECTOMY    ? ANTERIOR CERVICAL DECOMP/DISCECTOMY FUSION N/A 05/19/2020  ? Procedure: ANTERIOR CERVICAL DECOMPRESSION FUSION CERVICAL 5 - CERVICAL 6 WITH INSTRUMENTATION AND ALLOGRAFT;  Surgeon: Phylliss Bob, MD;  Location: [place];  Service: Orthopedics;  Laterality: N/A;  ? ANTERIOR CERVICAL DISCECTOMY  03/2017  ? C6-7; Dr. Christella Noa  ? LAPAROTOMY N/A 01/18/2021  ? Procedure: EXPLORATORY LAPAROTOMY;  Surgeon: Elgie Congo,  Carolann Littler, MD;  Location: [place];  Service: Gynecology;  Laterality: N/A;  ? SALPINGOOPHORECTOMY Right 01/18/2021  ? Procedure: OPEN RIGHT SALPINGO OOPHORECTOMY;  Surgeon: Griffin Basil, MD;  Location: [place];  Service: Gynecology;  Laterality: Right;  ? ?Family History  ?Problem Relation Age of Onset  ? Diabetes Mother   ? Breast cancer Mother   ?     had masectomy.   ? Skin cancer Maternal Grandfather   ? ?Allergies as of 07/11/2021   ?No Known Allergies ?  ? ?  ?Medication List  ?  ? ?  ? Accurate as of July 11, 2021  9:42 AM. If you have any questions, ask your nurse or doctor.  ?  ?  ? ?  ? ?albuterol 108 (90 Base) MCG/ACT inhaler ?Commonly known as: VENTOLIN HFA ?Inhale 1-2 puffs into the lungs daily as needed for wheezing or shortness of breath. ?  ?budesonide-formoterol 160-4.5 MCG/ACT inhaler ?Commonly known as: Symbicort ?Inhale 2 puffs into the lungs 2 (two) times daily. ?  ?glycopyrrolate 1 MG tablet ?Commonly known as: Robinul ?Take 1 tablet (1 mg total) by mouth daily. ?  ?ibuprofen 600 MG tablet ?Commonly known as: ADVIL ?Take 1  tablet (600 mg total) by mouth every 6 (six) hours as needed for headache, mild pain, moderate pain or cramping. ?  ?levothyroxine 50 MCG tablet ?Commonly known as: SYNTHROID ?Take 1 tablet (50 mcg total) by mouth daily before breakfast. ?  ?pantoprazole 40 MG tablet ?Commonly known as: PROTONIX ?Take 1 tablet (40 mg total) by mouth daily. ?  ?pregabalin 75 MG capsule ?Commonly known as: Lyrica ?Take 1 capsule (75 mg total) by mouth 2 (two) times daily. ?  ?tizanidine 2 MG capsule ?Commonly known as: ZANAFLEX ?Take 1 capsule (2 mg total) by mouth 3 (three) times daily as needed for muscle spasms. ?  ? ?  ? ? ?All past medical history, surgical history, allergies, family history, immunizations andmedications were updated in the EMR today and reviewed under the history and medication portions of their EMR.    ? ?ROS: Negative, with the exception of above mentioned in HPI ? ? ?Objective:  ?BP 129/87   Pulse 95   Temp 98.2 ?F (36.8 ?C) (Oral)   Ht '5\' 3"'$  (1.6 m)   Wt 149 lb (67.6 kg)   SpO2 100%   BMI 26.39 kg/m?  ?Body mass index is 26.39 kg/m?[facility] ?Physical Exam ?Vitals and nursing note reviewed.  ?Constitutional:   ?   General: She is not in acute distress. ?   Appearance: Normal appearance. She is not ill-appearing, toxic-appearing or diaphoretic.  ?HENT:  ?   Head: Normocephalic and atraumatic.  ?Eyes:  ?   General: No scleral icterus.    ?   Right eye: No discharge.     ?   Left eye: No discharge.  ?   Extraocular Movements: Extraocular movements intact.  ?   Conjunctiva/sclera: Conjunctivae normal.  ?   Pupils: Pupils are equal, round, and reactive to light.  ?Cardiovascular:  ?   Rate and Rhythm: Normal rate and regular rhythm.  ?   Heart sounds: No murmur heard. ?Pulmonary:  ?   Effort: Pulmonary effort is normal. No respiratory distress.  ?   Breath sounds: Normal breath sounds. No wheezing, rhonchi or rales.  ?Musculoskeletal:  ?   Cervical back: Neck supple. No tenderness.  ?   Right lower leg: No edema.   ?   Left lower leg: No edema.  ?  Lymphadenopathy:  ?   Cervical: No cervical adenopathy.  ?Skin: ?   General: Skin is warm and dry.  ?   Coloration: Skin is not jaundiced or pale.  ?   Findings: No erythema or rash.  ?Neurological:  ?   Mental Status: She is alert and oriented to person, place, and time. Mental status is at baseline.  ?   Motor: No weakness.  ?   Gait: Gait normal.  ?Psychiatric:     ?   Mood and Affect: Mood normal.     ?   Behavior: Behavior normal.     ?   Thought Content: Thought content normal.     ?   Judgment: Judgment normal.  ? ? ?No results found. ?No results found. ?No results found for this or any previous visit (from the past 24 hour(s)). ? ?Assessment/Plan: ?Jahniyah Revere is a 46 y.o. female present for OV for  ?Hypothyroidism ?-stable ?Continue  levothyroxine 18mg daily.  > refills after labs.  ?- TSH collected today.  ? ?Vit d def/b12 de/folate ?- encouraged her to take a B-complex vitamin.  ?Continue vit d 5000k otc qd with food> vit d collected today ? ?Anxiety/insomnia/chronic neck pain:  ?Stable ?Cymbalta no longer on her list.  ?Continue Lyrica 75 mg BID.  ?Could consider BB  ?F/u 6 mos.  ?  ?Hyperhidrosis of axilla ?Stable ?Continue  Robinul ? ?Elevated hemoglobin (HCC) ?- CBC w/Diff collected today (she quit smoking!) ? ?Abnormal findings on diagnostic imaging of lung/Pulmonary nodule less than 1 cm in diameter with moderate to high risk for malignant neoplasm/Ground glass opacity present on imaging of lung ?She quit smoking!!!  ?CT chest lung nodule follow due 11/2021> she is est w/ pulm now.  ? ? ? ?Reviewed expectations re: course of current medical issues. ?Discussed self-management of symptoms. ?Outlined signs and symptoms indicating need for more acute intervention. ?Patient verbalized understanding and all questions were answered. ?Patient received an After-Visit Summary. ? ? ? ?No orders of the defined types were placed in this encounter. ? ? ?No orders of the  defined types were placed in this encounter. ? ? ? ? ? ?Note is dictated utilizing voice recognition software. Although note has been proof read prior to signing, occasional typographical errors still can be mis

## 2021-07-11 NOTE — Telephone Encounter (Signed)
Please call patient ?Liver, kidney and thyroid function are normal> I have called in refills on her thyroid medication. ?Blood cell counts are looking much better than prior collections and are starting to normalize. ?Diabetes screening/A1c is normal  ? ? ?Vit d is very low at 21- I have called in the high dose vit d for her to take once weekly. She will need this indefinitely since we are unable to get her vitamin D up by over-the-counter vitamin D use. ?

## 2021-07-11 NOTE — Patient Instructions (Signed)
?  Great to see you today.  ?I have refilled the medication(s) we provide.  ? ?If labs were collected, we will inform you of lab results once received either by echart message or telephone call.  ? - echart message- for normal results that have been seen by the patient already.  ? - telephone call: abnormal results or if patient has not viewed results in their echart. ? ?Return in about 24 weeks (around 12/26/2021) for Routine chronic condition follow-up. ? ?

## 2021-07-12 NOTE — Telephone Encounter (Signed)
LVM for pt to CB regarding results.  

## 2021-07-12 NOTE — Telephone Encounter (Signed)
Spoke with pt regarding results and recommendations.  ? ?

## 2021-07-13 ENCOUNTER — Encounter: Payer: Self-pay | Admitting: Family Medicine

## 2021-07-15 LAB — CARDIO IQ INSULIN RESISTANCE PANEL WITH SCORE
C-PEPTIDE, LC/MS/MS: 1.18 ng/mL (ref 0.68–2.16)
INSULIN, INTACT, LC/MS/MS: 6 u[IU]/mL (ref <=16)
Insulin Resistance Score: 12 (ref <=66)

## 2021-07-18 ENCOUNTER — Telehealth: Payer: Self-pay | Admitting: Family Medicine

## 2021-07-18 NOTE — Telephone Encounter (Signed)
Spoke with pt regarding labs and instructions.   

## 2021-07-18 NOTE — Telephone Encounter (Signed)
Please inform patient her insulin resistance panel is normal.  She is not insulin resistant or diabetic. ?

## 2021-10-31 ENCOUNTER — Ambulatory Visit (INDEPENDENT_AMBULATORY_CARE_PROVIDER_SITE_OTHER): Payer: Self-pay | Admitting: Family Medicine

## 2021-10-31 ENCOUNTER — Encounter: Payer: Self-pay | Admitting: Family Medicine

## 2021-10-31 DIAGNOSIS — Z91199 Patient's noncompliance with other medical treatment and regimen due to unspecified reason: Secondary | ICD-10-CM

## 2021-10-31 DIAGNOSIS — F419 Anxiety disorder, unspecified: Secondary | ICD-10-CM

## 2021-10-31 DIAGNOSIS — J432 Centrilobular emphysema: Secondary | ICD-10-CM

## 2021-10-31 DIAGNOSIS — E034 Atrophy of thyroid (acquired): Secondary | ICD-10-CM

## 2021-10-31 DIAGNOSIS — F32A Depression, unspecified: Secondary | ICD-10-CM

## 2021-10-31 DIAGNOSIS — L74510 Primary focal hyperhidrosis, axilla: Secondary | ICD-10-CM

## 2021-10-31 NOTE — Progress Notes (Unsigned)
Laura Steele , 1976/03/06, 46 y.o., female MRN: [id number] Patient Care Team    Relationship Specialty Notifications Start End  Natalia Leatherwood, DO PCP - General Family Medicine  10/22/15   Coletta Memos, MD Consulting Physician Neurosurgery  06/22/17   Monica Becton, MD Consulting Physician Sports Medicine  06/22/17     No chief complaint on file.    Subjective: Laura Steele is a 46 y.o. 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  Hypothyroidism:  Patient reports compliance  with levothyroxine 50 mcg daily. She denies side effects.    Anxiety/Insomnia/chronic back pain:  Reports compliance with lyrica BID. ***     07/11/2021    9:33 AM 02/08/2021   10:27 AM 10/11/2020    2:28 PM 04/06/2020    2:09 PM 10/24/2017    8:34 AM  Depression screen PHQ 2/9  Decreased Interest 0 1 1 0 0  Down, Depressed, Hopeless 0 1 0 2 0  PHQ - 2 Score 0 2 1 2  0  Altered sleeping 3 2 2 3    Tired, decreased energy 2 1 2 3    Change in appetite 2 0 2 3   Feeling bad or failure about yourself  0 1 0 0   Trouble concentrating 0 0 1 0   Moving slowly or fidgety/restless 0 0 1 0   Suicidal thoughts 0 0 0 0   PHQ-9 Score 7 6 9 11        07/11/2021    9:33 AM 02/08/2021   10:27 AM 10/11/2020    2:30 PM 04/06/2020    2:11 PM  GAD 7 : Generalized Anxiety Score  Nervous, Anxious, on Edge 2 1 2  0  Control/stop worrying 2 2 1  0  Worry too much - different things 2 2 2  0  Trouble relaxing 2 2 2 3   Restless 1 1 2  0  Easily annoyed or irritable 3 3 3  0  Afraid - awful might happen 1 1 0 0  Total GAD 7 Score 13 12 12 3     No Known Allergies Social History   Tobacco Use   Smoking status: Former    Packs/day: 0.50    Types: Cigarettes    Quit date: 02/2021    Years since quitting: 0.6   Smokeless tobacco: Never   Tobacco comments:    0.5 packs smoked per day [facility] 12/23/20  Substance Use Topics   Alcohol  use: Not Currently    Comment: Pt states was drinkning 40 0z of beer a night. but stopped since sunday.   Past Medical History:  Diagnosis Date   Alcohol withdrawal delirium (HCC) 04/25/2020   Allergy    Anxiety    Asthma    Carpal tunnel syndrome, bilateral 01/17/2018   COPD (chronic obstructive pulmonary disease) (HCC)    Dermoid cyst of right ovary 12/29/2020   Headache    Migraines   Hypoglycemia    Hypothyroidism    Insomnia 06/22/2017   Pneumonia    Past Surgical History:  Procedure Laterality Date   ABDOMINAL HYSTERECTOMY     ANTERIOR CERVICAL DECOMP/DISCECTOMY FUSION N/A 05/19/2020   Procedure: ANTERIOR CERVICAL DECOMPRESSION FUSION CERVICAL 5 - CERVICAL 6 WITH INSTRUMENTATION AND ALLOGRAFT;  Surgeon: Estill Bamberg, MD;  Location: [facility] OR;  Service: Orthopedics;  Laterality: N/A;   ANTERIOR CERVICAL DISCECTOMY  03/2017   C6-7; Dr. Franky Macho   LAPAROTOMY N/A 01/18/2021  Procedure: EXPLORATORY LAPAROTOMY;  Surgeon: Warden Fillers, MD;  Location: [facility] OR;  Service: Gynecology;  Laterality: N/A;   SALPINGOOPHORECTOMY Right 01/18/2021   Procedure: OPEN RIGHT SALPINGO OOPHORECTOMY;  Surgeon: Warden Fillers, MD;  Location: [facility] OR;  Service: Gynecology;  Laterality: Right;   Family History  Problem Relation Age of Onset   Diabetes Mother    Breast cancer Mother        had masectomy.    Skin cancer Maternal Grandfather    Allergies as of 10/31/2021   No Known Allergies      Medication List        Accurate as of October 31, 2021 11:37 AM. If you have any questions, ask your nurse or doctor.          albuterol 108 (90 Base) MCG/ACT inhaler Commonly known as: VENTOLIN HFA Inhale 1-2 puffs into the lungs daily as needed for wheezing or shortness of breath.   budesonide-formoterol 160-4.5 MCG/ACT inhaler Commonly known as: Symbicort Inhale 2 puffs into the lungs 2 (two) times daily.   glycopyrrolate 1 MG tablet Commonly known as: Robinul Take 1 tablet (1 mg total) by  mouth daily.   levothyroxine 50 MCG tablet Commonly known as: SYNTHROID Take 1 tablet (50 mcg total) by mouth daily before breakfast.   pantoprazole 40 MG tablet Commonly known as: PROTONIX Take 1 tablet (40 mg total) by mouth daily.   pregabalin 75 MG capsule Commonly known as: Lyrica Take 1 capsule (75 mg total) by mouth 2 (two) times daily.   tizanidine 2 MG capsule Commonly known as: ZANAFLEX Take 1 capsule (2 mg total) by mouth 3 (three) times daily as needed for muscle spasms.   Vitamin D (Ergocalciferol) 1.25 MG (50000 UNIT) Caps capsule Commonly known as: DRISDOL Take 1 capsule (50,000 Units total) by mouth every 7 (seven) days.        All past medical history, surgical history, allergies, family history, immunizations andmedications were updated in the EMR today and reviewed under the history and medication portions of their EMR.     ROS: Negative, with the exception of above mentioned in HPI   Objective:  There were no vitals taken for this visit. There is no height or weight on file to calculate BMI. Physical Exam Vitals and nursing note reviewed.  Constitutional:      General: She is not in acute distress.    Appearance: Normal appearance. She is not ill-appearing, toxic-appearing or diaphoretic.  HENT:     Head: Normocephalic and atraumatic.  Eyes:     General: No scleral icterus.       Right eye: No discharge.        Left eye: No discharge.     Extraocular Movements: Extraocular movements intact.     Conjunctiva/sclera: Conjunctivae normal.     Pupils: Pupils are equal, round, and reactive to light.  Cardiovascular:     Rate and Rhythm: Normal rate and regular rhythm.     Heart sounds: No murmur heard. Pulmonary:     Effort: Pulmonary effort is normal. No respiratory distress.     Breath sounds: Normal breath sounds. No wheezing, rhonchi or rales.  Musculoskeletal:     Cervical back: Neck supple. No tenderness.     Right lower leg: No edema.      Left lower leg: No edema.  Lymphadenopathy:     Cervical: No cervical adenopathy.  Skin:    General: Skin is warm and dry.  Coloration: Skin is not jaundiced or pale.     Findings: No erythema or rash.  Neurological:     Mental Status: She is alert and oriented to person, place, and time. Mental status is at baseline.     Motor: No weakness.     Gait: Gait normal.  Psychiatric:        Mood and Affect: Mood normal.        Behavior: Behavior normal.        Thought Content: Thought content normal.        Judgment: Judgment normal.     No results found. No results found. No results found for this or any previous visit (from the past 24 hour(s)).  Assessment/Plan: Laura Steele is a 46 y.o. female present for OV for  Hypothyroidism -stable Continue  levothyroxine daily.  > refills after labs.  - TSH collected today.   Vit d def/b12 de/folate - encouraged her to take a B-complex vitamin.  Continue vit d 5000k otc qd with food> vit d collected today  Anxiety/insomnia/chronic neck pain:  Stable Cymbalta no longer on her list.  Continue Lyrica 75 mg BID.  Could consider BB  F/u 6 mos.    Hyperhidrosis of axilla Stable Continue  Robinul  Elevated hemoglobin (HCC) - CBC w/Diff collected today (she quit smoking!)  Abnormal findings on diagnostic imaging of lung/Pulmonary nodule less than 1 cm in diameter with moderate to high risk for malignant neoplasm/Ground glass opacity present on imaging of lung She quit smoking!!!  CT chest lung nodule follow due 11/2021> she is est w/ pulm now.     Reviewed expectations re: course of current medical issues. Discussed self-management of symptoms. Outlined signs and symptoms indicating need for more acute intervention. Patient verbalized understanding and all questions were answered. Patient received an After-Visit Summary.    No orders of the defined types were placed in this encounter.   No orders of the defined  types were placed in this encounter.      Note is dictated utilizing voice recognition software. Although note has been proof read prior to signing, occasional typographical errors still can be missed. If any questions arise, please do not hesitate to call for verification.   electronically signed by:  Felix Pacini, DO  [facility] - [place]

## 2021-11-07 ENCOUNTER — Encounter: Payer: Self-pay | Admitting: Family Medicine

## 2021-12-17 IMAGING — XA DG INJECT/[PERSON_NAME] INC NEEDLE/CATH/PLC EPI/CERV/THOR W/IMG
2 series · 2 of 2 positions shown · non-contrast
Comparison: none

CLINICAL DATA: Cervical disc disease. Left upper extremity
radiculopathy.

[Series 1: ortho standard · 1 of 1 slices shown (1 of 2)]
[im 1/1]
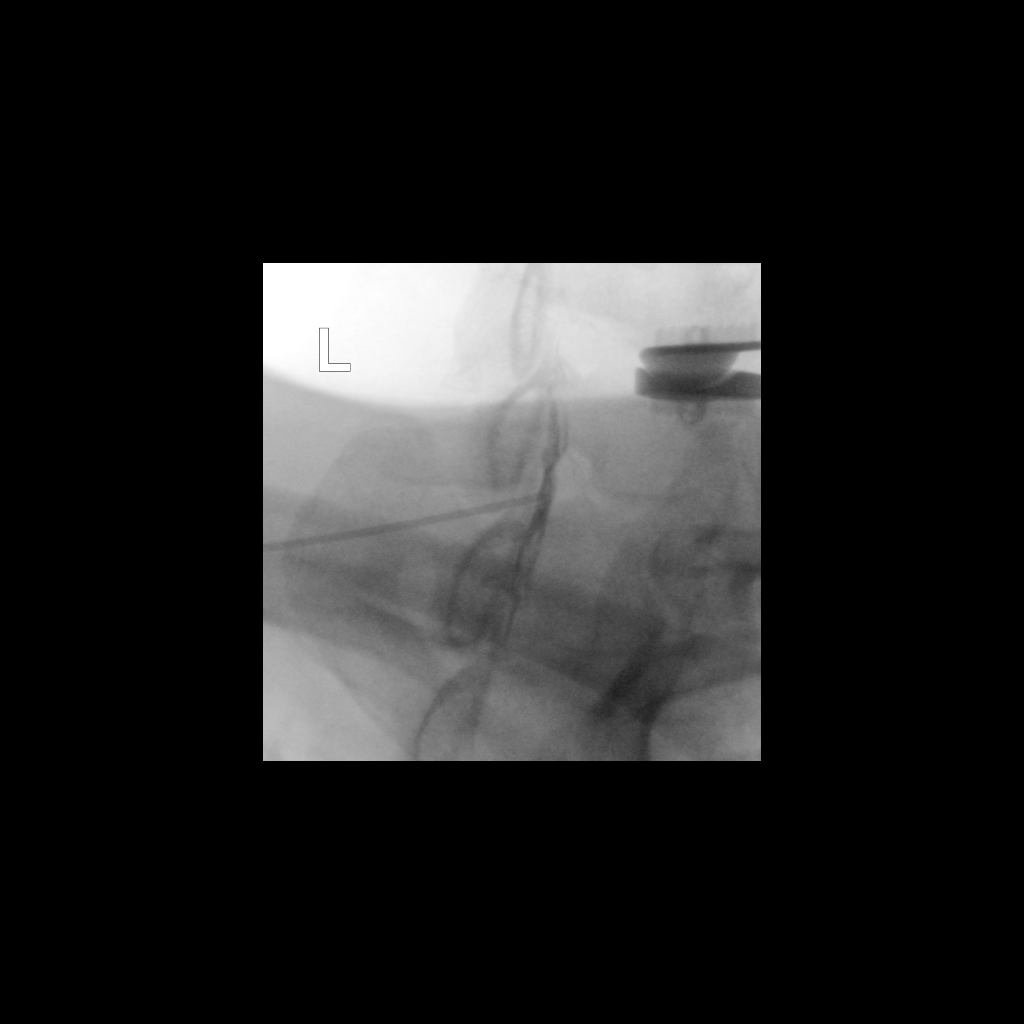

[Series 2: ortho standard · 1 of 1 slices shown (2 of 2)]
[im 1/1]
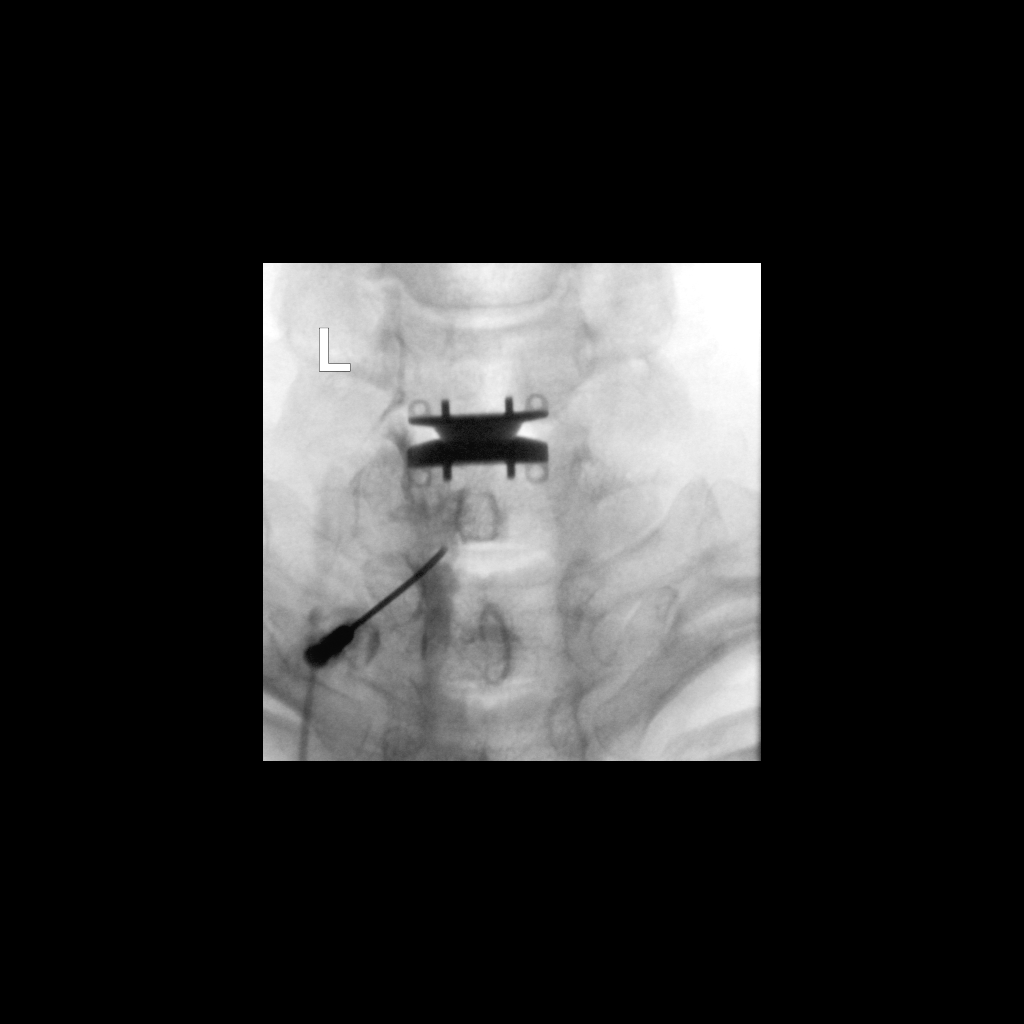

[2 of 2 positions shown; findings below may reference images not displayed]

FLUOROSCOPY TIME:  dictate in minutes & seconds

PROCEDURE:
CERVICAL EPIDURAL INJECTION

An interlaminar approach was performed on the left at C7-T1. A 20
gauge epidural needle was advanced using loss-of-resistance
technique.

DIAGNOSTIC EPIDURAL INJECTION

Injection of Isovue-M 300 shows a good epidural pattern with spread
above and below the level of needle placement, primarily on the
left. No vascular opacification is seen. THERAPEUTIC

EPIDURAL INJECTION

1.5 ml of Kenalog 40 mixed with 1 ml of 1% Lidocaine and 2 ml of
normal saline were then instilled. The procedure was well-tolerated,
and the patient was discharged thirty minutes following the
injection in good condition.
IMPRESSION: Technically successful first epidural injection on the left at
C7-T1.

## 2022-02-25 IMAGING — DX DG CERVICAL SPINE WITH FLEX & EXTEND
7 series · 7 of 7 positions shown · non-contrast
Comparison: 07/19/2017

CLINICAL DATA: Pain

EXAM:
CERVICAL SPINE COMPLETE WITH FLEXION AND EXTENSION VIEWS

[c-spine lat]
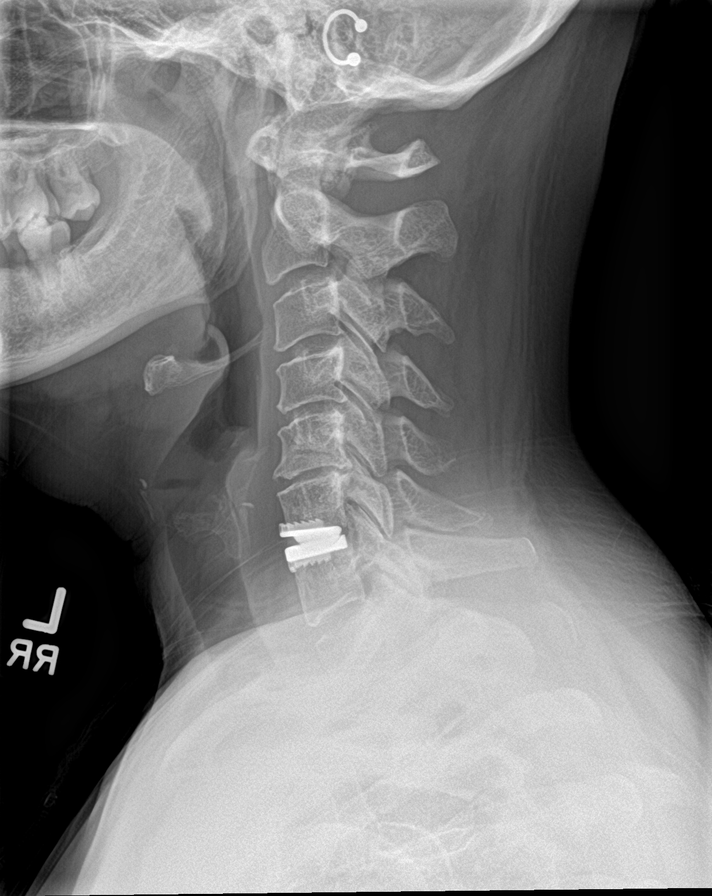

[c-spine flex]
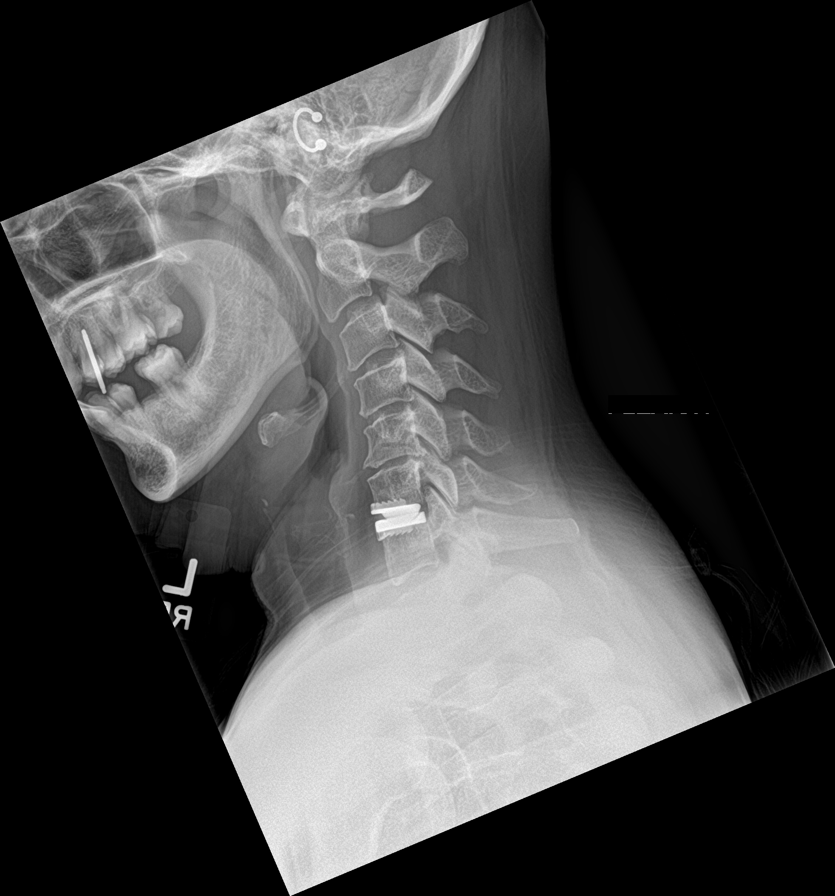

[c-spine ext]
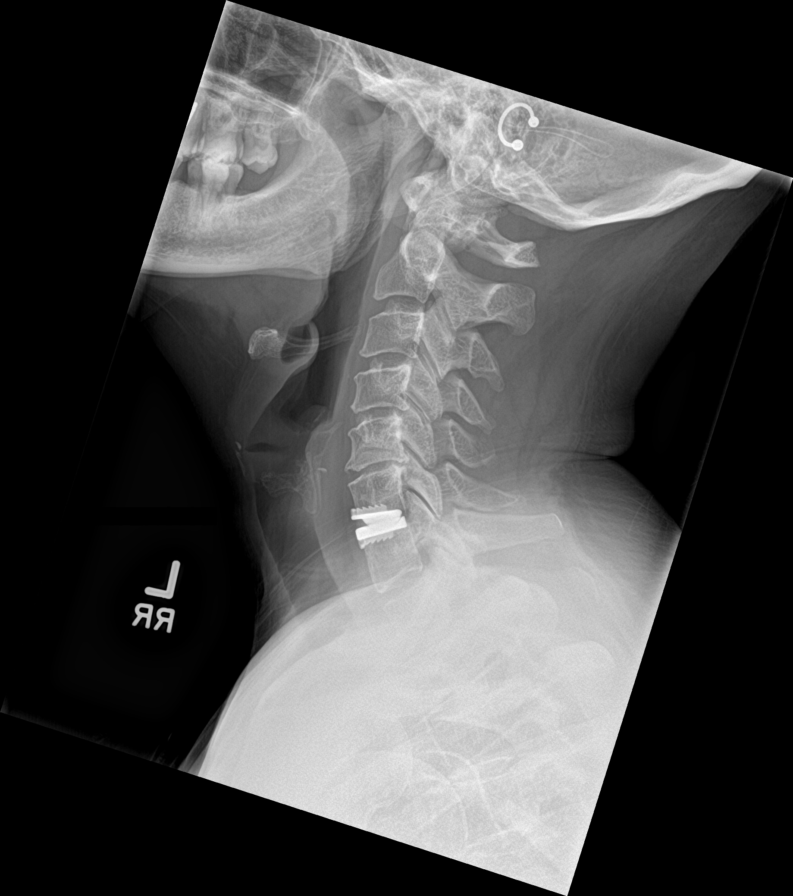

[c-spine obl (1 of 2)]
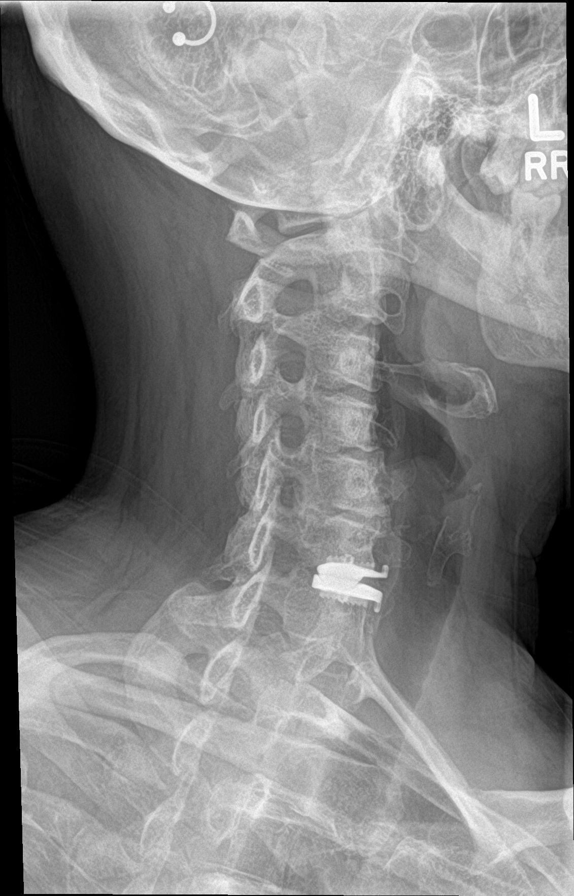

[c-spine obl (2 of 2)]
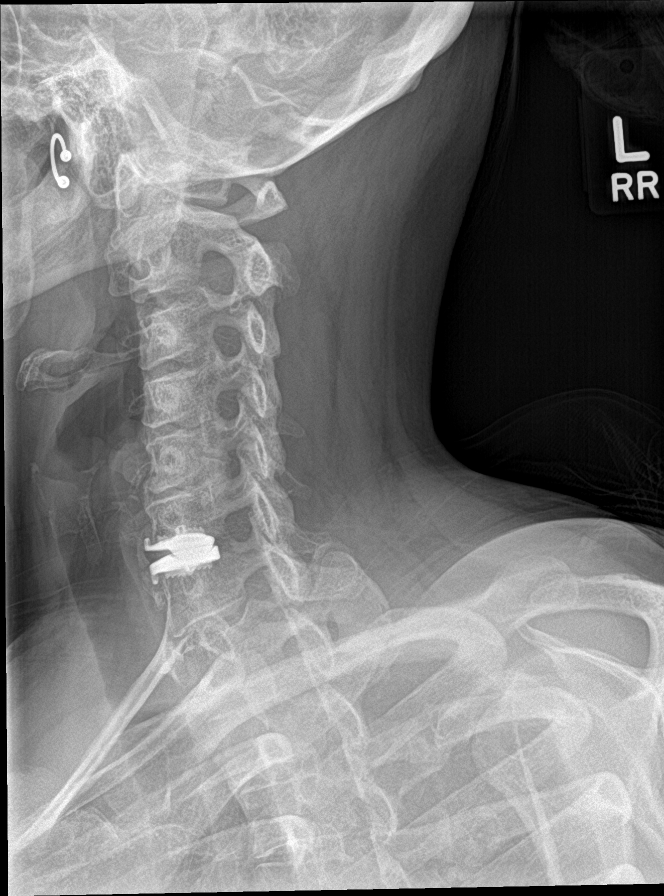

[c-spine ap]
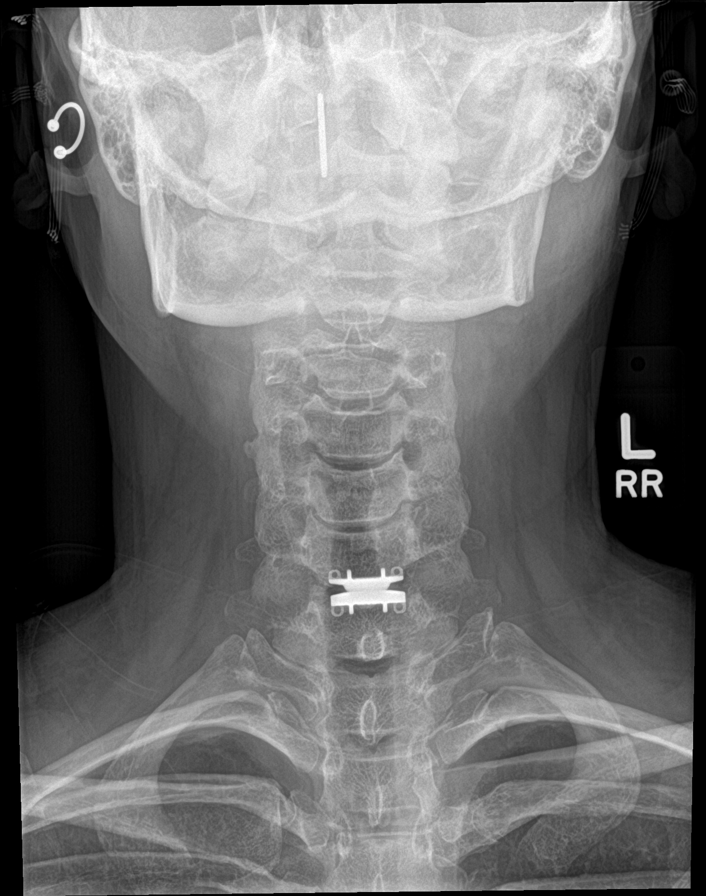

[c-spine open mouth]
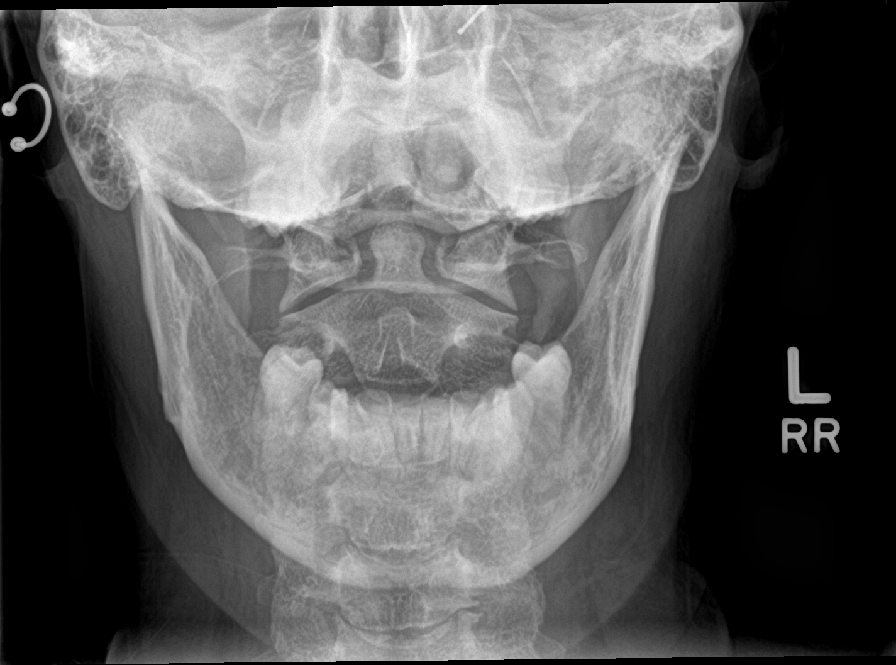

[7 of 7 positions shown; findings below may reference images not displayed]

FINDINGS: There is a stable interbody spacer at the C6-C7 level. There is disc
height loss at the C4-C5 and C5-C6 levels. There are posterior
osteophytes at the C5-C6 level. No prevertebral soft tissue
swelling. No acute displaced fracture. There is a 1-2 mm dynamic
listhesis of C2 on C3. There is a minimal 1 mm dynamic listhesis of
C4 on C5. There is osseous neural foraminal narrowing at the C5-C6
level bilaterally.
IMPRESSION: 1. No acute displaced fracture.
2. Minimal dynamic listhesis of C2 on C3 and C4 on C5.
3. Degenerative changes as above, most notably at C5-C6 with
bilateral osseous neural foraminal narrowing.

## 2022-11-23 ENCOUNTER — Ambulatory Visit: Payer: Self-pay | Admitting: Obstetrics and Gynecology

## 2022-11-29 ENCOUNTER — Ambulatory Visit: Payer: Self-pay | Admitting: Obstetrics and Gynecology
